# Patient Record
Sex: Female | Born: 1935 | Race: White | Hispanic: No | Smoking: Never smoker
Health system: Southern US, Community
[De-identification: ages and names within clinical notes are randomized; demographics above are authoritative.]

## PROBLEM LIST (undated history)

## (undated) DIAGNOSIS — Z8619 Personal history of other infectious and parasitic diseases: Secondary | ICD-10-CM

## (undated) DIAGNOSIS — I10 Essential (primary) hypertension: Secondary | ICD-10-CM

## (undated) DIAGNOSIS — K5790 Diverticulosis of intestine, part unspecified, without perforation or abscess without bleeding: Secondary | ICD-10-CM

## (undated) HISTORY — PX: ABDOMINAL HYSTERECTOMY: SHX81

## (undated) HISTORY — PX: CHOLECYSTECTOMY: SHX55

---

## 2017-11-11 ENCOUNTER — Emergency Department: Payer: Medicare Other

## 2017-11-11 ENCOUNTER — Emergency Department
Admission: EM | Admit: 2017-11-11 | Discharge: 2017-11-11 | Disposition: A | Discharge status: Home / Self Care | Payer: Medicare Other | Attending: Emergency Medicine | Admitting: Emergency Medicine

## 2017-11-11 ENCOUNTER — Encounter: Payer: Self-pay | Admitting: Emergency Medicine

## 2017-11-11 ENCOUNTER — Other Ambulatory Visit: Payer: Self-pay

## 2017-11-11 DIAGNOSIS — R1032 Left lower quadrant pain: Secondary | ICD-10-CM | POA: Insufficient documentation

## 2017-11-11 DIAGNOSIS — Z79899 Other long term (current) drug therapy: Secondary | ICD-10-CM | POA: Diagnosis not present

## 2017-11-11 DIAGNOSIS — R1031 Right lower quadrant pain: Secondary | ICD-10-CM | POA: Insufficient documentation

## 2017-11-11 DIAGNOSIS — G8929 Other chronic pain: Secondary | ICD-10-CM

## 2017-11-11 DIAGNOSIS — R197 Diarrhea, unspecified: Secondary | ICD-10-CM | POA: Insufficient documentation

## 2017-11-11 HISTORY — DX: Diverticulosis of intestine, part unspecified, without perforation or abscess without bleeding: K57.90

## 2017-11-11 HISTORY — DX: Personal history of other infectious and parasitic diseases: Z86.19

## 2017-11-11 LAB — COMPREHENSIVE METABOLIC PANEL
ALT: 24 U/L (ref 14–54)
AST: 36 U/L (ref 15–41)
Albumin: 4.1 g/dL (ref 3.5–5.0)
Alkaline Phosphatase: 106 U/L (ref 38–126)
Anion gap: 11 (ref 5–15)
BUN: 15 mg/dL (ref 6–20)
CO2: 22 mmol/L (ref 22–32)
Calcium: 9.2 mg/dL (ref 8.9–10.3)
Chloride: 95 mmol/L — ABNORMAL LOW (ref 101–111)
Creatinine, Ser: 1.11 mg/dL — ABNORMAL HIGH (ref 0.44–1.00)
GFR calc Af Amer: 48 mL/min — ABNORMAL LOW (ref >60)
GFR calc non Af Amer: 42 mL/min — ABNORMAL LOW (ref >60)
Glucose, Bld: 97 mg/dL (ref 65–99)
Potassium: 3.9 mmol/L (ref 3.5–5.1)
Sodium: 128 mmol/L — ABNORMAL LOW (ref 135–145)
Total Bilirubin: 0.9 mg/dL (ref 0.3–1.2)
Total Protein: 6.8 g/dL (ref 6.5–8.1)

## 2017-11-11 LAB — URINALYSIS, COMPLETE (UACMP) WITH MICROSCOPIC
Bacteria, UA: NONE SEEN
Bilirubin Urine: NEGATIVE
Glucose, UA: NEGATIVE mg/dL
Hgb urine dipstick: NEGATIVE
Ketones, ur: NEGATIVE mg/dL
Leukocytes, UA: NEGATIVE
Nitrite: NEGATIVE
Protein, ur: NEGATIVE mg/dL
Specific Gravity, Urine: 1.002 — ABNORMAL LOW (ref 1.005–1.030)
Squamous Epithelial / LPF: NONE SEEN (ref 0–5)
pH: 8 (ref 5.0–8.0)

## 2017-11-11 LAB — CBC
HCT: 42.5 % (ref 35.0–47.0)
Hemoglobin: 14.4 g/dL (ref 12.0–16.0)
MCH: 28.2 pg (ref 26.0–34.0)
MCHC: 33.9 g/dL (ref 32.0–36.0)
MCV: 83.3 fL (ref 80.0–100.0)
Platelets: 250 10*3/uL (ref 150–440)
RBC: 5.11 MIL/uL (ref 3.80–5.20)
RDW: 15.5 % — ABNORMAL HIGH (ref 11.5–14.5)
WBC: 11.6 10*3/uL — ABNORMAL HIGH (ref 3.6–11.0)

## 2017-11-11 LAB — LIPASE, BLOOD: Lipase: 28 U/L (ref 11–51)

## 2017-11-11 MED ORDER — FLUCONAZOLE 50 MG PO TABS
150.0000 mg | ORAL_TABLET | Freq: Once | ORAL | Status: AC
  Administered 2017-11-11: 150 mg via ORAL
  Filled 2017-11-11: qty 1

## 2017-11-11 MED ORDER — IOPAMIDOL (ISOVUE-300) INJECTION 61%
80.0000 mL | Freq: Once | INTRAVENOUS | Status: AC | PRN
  Administered 2017-11-11: 80 mL via INTRAVENOUS

## 2017-11-11 MED ORDER — ALUM & MAG HYDROXIDE-SIMETH 200-200-20 MG/5ML PO SUSP
30.0000 mL | Freq: Once | ORAL | Status: AC
  Administered 2017-11-11: 30 mL via ORAL
  Filled 2017-11-11: qty 30

## 2017-11-11 MED ORDER — SODIUM CHLORIDE 0.9 % IV BOLUS
500.0000 mL | INTRAVENOUS | Status: AC
  Administered 2017-11-11: 500 mL via INTRAVENOUS

## 2017-11-11 MED ORDER — LIDOCAINE VISCOUS HCL 2 % MT SOLN
7.5000 mL | Freq: Once | OROMUCOSAL | Status: AC
Start: 2017-11-11 — End: 2017-11-11
  Administered 2017-11-11: 7.5 mL via OROMUCOSAL
  Filled 2017-11-11: qty 15

## 2017-11-11 NOTE — ED Triage Notes (Addendum)
Patient ambulatory to triage with steady gait, without difficulty or distress noted; pt reports lower abd "burning" x [id number]; just arrived from [place] and has appt [facility] this [facility] but c/o increase in pain with diarrhea

## 2017-11-11 NOTE — ED Notes (Signed)
Pt ambulated to BR without difficulty, pt resting in bed at this time. Pt has ice pack to abd over her shirt which pt states is what she does for the "burning" sensation, pt states it helps.

## 2017-11-11 NOTE — ED Provider Notes (Signed)
[facility] Emergency Department Provider Note  ____________________________________________   First MD Initiated Contact with Patient 11/11/17 [phone]     (approximate)  I have reviewed the triage vital signs and the nursing notes.   HISTORY  Chief Complaint Abdominal Pain    HPI Faith Vasquez is a 84 y.o. female who presents for evaluation of 4 years of lower abdominal burning pain is recently worsening watery stools.  The history is complicated and she presents with her son and daughter-in-law, but in short, she is from [place] and has at least temporarily relocated to [facility] in order to have second or additional opinions regarding her ongoing abdominal issues.  She has been seeing gastroenterology in [facility] for quite some time and according to the family they are not able to tell the patient why she continues to have burning lower abdominal pain.  She has had CT scans, she has been on medications, she has had lab work, etc., but there is been no definitive diagnosis.  They believe that she had C. difficile infection may be a year or 2 ago but that seemed to have resolved.  She has been on antibiotics recently for urinary tract infection she reports some persistent burning in her abdomen but but also "down there" indicating her genital region.  The family recently drove her here from [place] and they stopped off in [place] for her to go to an emergency department for the abdominal pain and diarrhea.  They said that no specific diagnosis was identified.  They continued on from [place] to [place] and the patient reports that all of the driving has made her lower abdominal burning worse.  She is unable to get any sleep because of the discomfort.  Her family reports that medications do not seem to help including tramadol which they were prescribed presumably in [place].  However they do not like giving her anything narcotic or related because it makes her  lightheaded and confused.  They would like to get some answers about why she has been having this pain and discomfort for 4 years and they would like her to get some rest tonight so they brought her to the emergency department.  Nothing in particular makes the abdominal pain better and nothing particular makes the diarrhea better or worse.  She describes it as watery and loose and "explosive".  Of note, she has an appointment with a [facility] gastroenterologist tomorrow.  Past Medical History:  Diagnosis Date  . Diverticulosis   . History of Clostridium difficile infection    uncertain dates, possibly in 2017 or 2018    There are no active problems to display for this patient.   Past Surgical History:  Procedure Laterality Date  . ABDOMINAL HYSTERECTOMY    . CHOLECYSTECTOMY      Prior to Admission medications   Medication Sig Start Date End Date Taking? Authorizing Provider  acidophilus (RISAQUAD) CAPS capsule Take 1 capsule by mouth daily.   Yes [provider]  famotidine (PEPCID) 40 MG tablet Take 40 mg by mouth 2 (two) times daily.   Yes [provider]  multivitamin-lutein (OCUVITE-LUTEIN) CAPS capsule Take 1 capsule by mouth daily.   Yes [provider]    Allergies Ciprofloxacin and Iodine  History reviewed. No pertinent family history.  Social History Social History   Tobacco Use  . Smoking status: Never Smoker  . Smokeless tobacco: Never Used  Substance Use Topics  . Alcohol use: Never    Frequency: Never  .  Drug use: Never    Review of Systems Constitutional: No fever/chills Eyes: No visual changes. ENT: No sore throat. Cardiovascular: Denies chest pain. Respiratory: Denies shortness of breath. Gastrointestinal: Chronic abdominal pain and diarrhea as described above Genitourinary: Negative for dysuria. Musculoskeletal: Negative for neck pain.  Negative for back pain. Integumentary: Negative for rash. Neurological: Negative for  headaches, focal weakness or numbness.   ____________________________________________   PHYSICAL EXAM:  VITAL SIGNS: ED Triage Vitals  Enc Vitals Group     BP 11/11/17 0214 (!) 167/87     Pulse Rate 11/11/17 0214 73     Resp 11/11/17 0214 18     Temp 11/11/17 0214 (!) 97.4 F (36.3 C)     Temp Source 11/11/17 0214 Oral     SpO2 11/11/17 0214 97 %     Weight 11/11/17 0212 63 kg (139 lb)     Height 11/11/17 0212 1.651 m (5\' 5" )     Head Circumference --      Peak Flow --      Pain Score 11/11/17 0212 8     Pain Loc --      Pain Edu? --      Excl. in GC? --     Constitutional: Alert and oriented. Well appearing and in no acute distress.  Looks considerably younger than chronological age. Eyes: Conjunctivae are normal.  Head: Atraumatic. Nose: No congestion/rhinnorhea. Mouth/Throat: Mucous membranes are moist. Neck: No stridor.  No meningeal signs.   Cardiovascular: Normal rate, regular rhythm. Good peripheral circulation. Grossly normal heart sounds. Respiratory: Normal respiratory effort.  No retractions. Lungs CTAB. Gastrointestinal: Soft and nondistended.  Diffuse tenderness throughout the lower abdomen with no focal tenderness Musculoskeletal: No lower extremity tenderness nor edema. No gross deformities of extremities. Neurologic:  Normal speech and language. No gross focal neurologic deficits are appreciated.  Skin:  Skin is warm, dry and intact. No rash noted. Psychiatric: Mood and affect are normal. Speech and behavior are normal.  ____________________________________________   LABS (all labs ordered are listed, but only abnormal results are displayed)  Labs Reviewed  COMPREHENSIVE METABOLIC PANEL - Abnormal; Notable for the following components:      Result Value   Sodium 128 (*)    Chloride 95 (*)    Creatinine, Ser 1.11 (*)    GFR calc non Af Amer 42 (*)    GFR calc Af Amer 48 (*)    All other components within normal limits  CBC - Abnormal; Notable for  the following components:   WBC 11.6 (*)    RDW 15.5 (*)    All other components within normal limits  URINALYSIS, COMPLETE (UACMP) WITH MICROSCOPIC - Abnormal; Notable for the following components:   Color, Urine COLORLESS (*)    APPearance CLEAR (*)    Specific Gravity, Urine 1.002 (*)    All other components within normal limits  C DIFFICILE QUICK SCREEN W PCR REFLEX  GASTROINTESTINAL PANEL BY PCR, STOOL (REPLACES STOOL CULTURE)  LIPASE, BLOOD   ____________________________________________  EKG  No indication for EKG   ____________________________________________  RADIOLOGY   ED MD interpretation: No acute process demonstrated within the abdomen or pelvis with no sign of obstruction or inflammation.  The patient does have diverticulosis, as noted in her history, but without any evidence of diverticulitis.  She also has a pulmonary nodule.  Official radiology report(s): Ct Abdomen Pelvis W Contrast  Result Date: 11/11/2017 CLINICAL DATA:  Lower abdominal burning for 4 years. Pain and diarrhea. EXAM:  CT ABDOMEN AND PELVIS WITH CONTRAST TECHNIQUE: Multidetector CT imaging of the abdomen and pelvis was performed using the standard protocol following bolus administration of intravenous contrast. CONTRAST:  80mL ISOVUE-300 IOPAMIDOL (ISOVUE-300) INJECTION 61% COMPARISON:  None. FINDINGS: Lower chest: Mild dependent changes in the lung bases. 4 mm nodule in the left lung base. Small esophageal hiatal hernia. Hepatobiliary: No focal liver abnormality is seen. Status post cholecystectomy. No biliary dilatation. Pancreas: Unremarkable. No pancreatic ductal dilatation or surrounding inflammatory changes. Spleen: Vascular calcifications in the spleen. Subcentimeter focal low-attenuation lesion is nonspecific but likely to represent a hemangioma. Adrenals/Urinary Tract: Subcentimeter nodule in the right adrenal gland. Prominent bilateral parapelvic renal cysts with parenchymal cysts on the right  kidney. No hydronephrosis or hydroureter. Nephrograms are symmetrical. Bladder is unremarkable. Stomach/Bowel: Stomach, small bowel, and colon are not abnormally distended. No wall thickening or inflammatory infiltration identified. Prominent diverticulosis of the sigmoid colon with scattered diverticula throughout the colon. No inflammatory changes. Surgical absence of the appendix. Vascular/Lymphatic: Aortic atherosclerosis. No enlarged abdominal or pelvic lymph nodes. Reproductive: Status post hysterectomy. No adnexal masses. Other: Minimal periumbilical hernia containing fat. No free air or free fluid in the abdomen. Musculoskeletal: Degenerative changes in the spine. No destructive bone lesions. IMPRESSION: 1. No acute process demonstrated in the abdomen or pelvis. No evidence of bowel obstruction or inflammation. 2. Diverticulosis of the colon without evidence of diverticulitis. 3. Bilateral renal cysts. 4. Aortic atherosclerosis. 5. 4 mm nodule in the left lung base. No follow-up needed if patient is low-risk. Non-contrast chest CT can be considered in 12 months if patient is high-risk. This recommendation follows the consensus statement: Guidelines for Management of Incidental Pulmonary Nodules Detected on CT Images: From the Fleischner Society 2017; Radiology 2017; 284:228-243. Electronically Signed   By: Burman Nieves M.D.   On: 11/11/2017 05:41    ____________________________________________   PROCEDURES  Critical Care performed: No   Procedure(s) performed:   Procedures   ____________________________________________   INITIAL IMPRESSION / ASSESSMENT AND PLAN / ED COURSE  As part of my medical decision making, I reviewed the following data within the electronic MEDICAL RECORD NUMBEROld chart reviewed, history discussed with patient and family, labs reviewed, radiology report reviewed    Differential diagnosis includes, but is not limited to, chronic abdominal pain of nonspecific  etiology, C. difficile colitis, diverticulitis, nonspecific viral enteritis/colitis, obstruction/ileus, neoplasm, medication or dietary side effect.  In spite of the patient's history and frustration with her symptoms, she is very well-appearing and does not appear to be 84 years old.  Her vital signs are stable, afebrile, no tachycardia, elevated blood pressure but otherwise within normal limits.  Lab work is reassuring.  She has a normal lipase, and normal CBC except for a very mild leukocytosis of 11.6, negative urinalysis, and a conference of metabolic panel that is notable only for a very slightly decreased sodium of 128 that is most likely volume related.  I am giving her a normal saline bolus of 500 mL IV.  After an extensive discussion with the family, who are obviously frustrated with the persistent symptoms and lack of answers from their perspective, I agreed to check a CT scan with IV contrast only (so as not to worsen her diarrhea).  I was unable to find in her medical record any of the prior results from CT scans or anything extensive other than her gastroenterology notes indicating diverticulosis.  I also agreed that we would check a C. difficile and check GI pathogen panel if  she is able to provide a stool specimen.  We will observe the patient for 5 hours in the emergency department as she has been unable to provide a stool specimen.  Her CT scan was reassuring with no evidence of acute abnormality.  I gave her viscous lidocaine 2% 7.5 mL (approximately 150 mg) in Maalox 30 mL in an attempt to help provide some relief for the burning she is experiencing, but we discussed how narcotics are not can help and they do not want her to have narcotics anyway.  At her age other medications, including Bentyl, benzodiazepines, Benadryl, etc., can all have significant side effects.  I will encourage them to follow-up in 24 hours with her new GI doctor and in the meantime to stick with  Tylenol.  Clinical Course as of Nov 11 737  Wed Nov 11, 2017  0731 Apparently the patient did have a bowel movement and left the sample but it was either not collected in the emergency department or lost on the way to the lab.  I explained that we did not have a sample to run and the patient and her family are comfortable with following up tomorrow [facility] as [facility] rather than staying to give another sample.  I think this is appropriate given that her symptoms are chronic and there is no evidence of acute infection at this time. I gave my usual and customary return precautions.  I also provided a CD with her CT imaging to take with her for tomorrow's appointment.   [CF]    Clinical Course User Index [CF] Loleta RoseForbach, Legna Mausolf, MD    ____________________________________________  FINAL CLINICAL IMPRESSION(S) / ED DIAGNOSES  Final diagnoses:  Chronic bilateral lower abdominal pain  Diarrhea, unspecified type     MEDICATIONS GIVEN DURING THIS VISIT:  Medications  sodium chloride 0.9 % bolus 500 mL (0 mLs Intravenous Stopped 11/11/17 0641)  fluconazole (DIFLUCAN) tablet 150 mg (150 mg Oral Given 11/11/17 0515)  lidocaine (XYLOCAINE) 2 % viscous mouth solution 7.5 mL (7.5 mLs Mouth/Throat Given 11/11/17 0516)  alum & mag hydroxide-simeth (MAALOX/MYLANTA) 200-200-20 MG/5ML suspension 30 mL (30 mLs Oral Given 11/11/17 0516)  iopamidol (ISOVUE-300) 61 % injection 80 mL (80 mLs Intravenous Contrast Given 11/11/17 0521)     ED Discharge Orders    None       Note:  This document was prepared using Dragon voice recognition software and may include unintentional dictation errors.    Loleta RoseForbach, Janthony Holleman, MD 11/11/17 [phone]

## 2017-11-11 NOTE — Discharge Instructions (Signed)

## 2017-11-11 NOTE — ED Notes (Signed)
Pt returned from CT °

## 2017-11-11 NOTE — ED Notes (Signed)
ED Provider at bedside. 

## 2020-03-24 ENCOUNTER — Emergency Department: Payer: Medicare Other

## 2020-03-24 ENCOUNTER — Other Ambulatory Visit: Payer: Self-pay

## 2020-03-24 ENCOUNTER — Inpatient Hospital Stay
Admission: EM | Admit: 2020-03-24 | Discharge: 2020-03-27 | DRG: 521 | Disposition: A | Discharge status: Skilled Nursing Facility | Payer: Medicare Other | Attending: Hospitalist | Admitting: Hospitalist

## 2020-03-24 DIAGNOSIS — Z90710 Acquired absence of both cervix and uterus: Secondary | ICD-10-CM

## 2020-03-24 DIAGNOSIS — J9691 Respiratory failure, unspecified with hypoxia: Secondary | ICD-10-CM | POA: Diagnosis present

## 2020-03-24 DIAGNOSIS — S72002A Fracture of unspecified part of neck of left femur, initial encounter for closed fracture: Secondary | ICD-10-CM

## 2020-03-24 DIAGNOSIS — R339 Retention of urine, unspecified: Secondary | ICD-10-CM | POA: Diagnosis not present

## 2020-03-24 DIAGNOSIS — Z20822 Contact with and (suspected) exposure to covid-19: Secondary | ICD-10-CM | POA: Diagnosis present

## 2020-03-24 DIAGNOSIS — S72032A Displaced midcervical fracture of left femur, initial encounter for closed fracture: Principal | ICD-10-CM | POA: Diagnosis present

## 2020-03-24 DIAGNOSIS — W19XXXA Unspecified fall, initial encounter: Secondary | ICD-10-CM

## 2020-03-24 DIAGNOSIS — Y92019 Unspecified place in single-family (private) house as the place of occurrence of the external cause: Secondary | ICD-10-CM

## 2020-03-24 DIAGNOSIS — Z79899 Other long term (current) drug therapy: Secondary | ICD-10-CM | POA: Diagnosis not present

## 2020-03-24 DIAGNOSIS — I447 Left bundle-branch block, unspecified: Secondary | ICD-10-CM | POA: Diagnosis present

## 2020-03-24 DIAGNOSIS — Z888 Allergy status to other drugs, medicaments and biological substances status: Secondary | ICD-10-CM

## 2020-03-24 DIAGNOSIS — K219 Gastro-esophageal reflux disease without esophagitis: Secondary | ICD-10-CM | POA: Diagnosis present

## 2020-03-24 DIAGNOSIS — G8918 Other acute postprocedural pain: Secondary | ICD-10-CM

## 2020-03-24 DIAGNOSIS — S72002B Fracture of unspecified part of neck of left femur, initial encounter for open fracture type I or II: Secondary | ICD-10-CM

## 2020-03-24 DIAGNOSIS — W010XXA Fall on same level from slipping, tripping and stumbling without subsequent striking against object, initial encounter: Secondary | ICD-10-CM | POA: Diagnosis present

## 2020-03-24 DIAGNOSIS — Z881 Allergy status to other antibiotic agents status: Secondary | ICD-10-CM | POA: Diagnosis not present

## 2020-03-24 LAB — CBC
HCT: 44.5 % (ref 36.0–46.0)
Hemoglobin: 14.6 g/dL (ref 12.0–15.0)
MCH: 28.1 pg (ref 26.0–34.0)
MCHC: 32.8 g/dL (ref 30.0–36.0)
MCV: 85.6 fL (ref 80.0–100.0)
Platelets: 143 10*3/uL — ABNORMAL LOW (ref 150–400)
RBC: 5.2 MIL/uL — ABNORMAL HIGH (ref 3.87–5.11)
RDW: 14.2 % (ref 11.5–15.5)
WBC: 6.9 10*3/uL (ref 4.0–10.5)
nRBC: 0 % (ref 0.0–0.2)

## 2020-03-24 LAB — RESPIRATORY PANEL BY RT PCR (FLU A&B, COVID)
Influenza A by PCR: NEGATIVE
Influenza B by PCR: NEGATIVE
SARS Coronavirus 2 by RT PCR: NEGATIVE

## 2020-03-24 LAB — BASIC METABOLIC PANEL
Anion gap: 11 (ref 5–15)
BUN: 28 mg/dL — ABNORMAL HIGH (ref 8–23)
CO2: 24 mmol/L (ref 22–32)
Calcium: 9.8 mg/dL (ref 8.9–10.3)
Chloride: 105 mmol/L (ref 98–111)
Creatinine, Ser: 1.44 mg/dL — ABNORMAL HIGH (ref 0.44–1.00)
GFR, Estimated: 34 mL/min — ABNORMAL LOW (ref >60)
Glucose, Bld: 236 mg/dL — ABNORMAL HIGH (ref 70–99)
Potassium: 4.1 mmol/L (ref 3.5–5.1)
Sodium: 140 mmol/L (ref 135–145)

## 2020-03-24 MED ORDER — METHOCARBAMOL 500 MG PO TABS
500.0000 mg | ORAL_TABLET | Freq: Four times a day (QID) | ORAL | Status: DC | PRN
  Administered 2020-03-24 – 2020-03-26 (×2): 500 mg via ORAL
  Filled 2020-03-24 (×3): qty 1

## 2020-03-24 MED ORDER — MORPHINE SULFATE (PF) 2 MG/ML IV SOLN
0.5000 mg | Freq: Once | INTRAVENOUS | Status: AC
  Administered 2020-03-24: 0.5 mg via INTRAVENOUS
  Filled 2020-03-24: qty 1

## 2020-03-24 MED ORDER — ONDANSETRON HCL 4 MG/2ML IJ SOLN
4.0000 mg | Freq: Once | INTRAMUSCULAR | Status: AC
Start: 2020-03-24 — End: 2020-03-24
  Administered 2020-03-24: 4 mg via INTRAVENOUS
  Filled 2020-03-24: qty 2

## 2020-03-24 MED ORDER — CEFAZOLIN SODIUM-DEXTROSE 1-4 GM/50ML-% IV SOLN
1.0000 g | Freq: Once | INTRAVENOUS | Status: DC
  Filled 2020-03-24: qty 50

## 2020-03-24 MED ORDER — MORPHINE SULFATE (PF) 2 MG/ML IV SOLN
0.5000 mg | INTRAVENOUS | Status: DC | PRN
  Administered 2020-03-24 – 2020-03-25 (×2): 0.5 mg via INTRAVENOUS
  Filled 2020-03-24 (×2): qty 1

## 2020-03-24 MED ORDER — HYDROCODONE-ACETAMINOPHEN 5-325 MG PO TABS
1.0000 | ORAL_TABLET | Freq: Four times a day (QID) | ORAL | Status: DC | PRN
  Filled 2020-03-24: qty 2

## 2020-03-24 MED ORDER — FENTANYL CITRATE (PF) 100 MCG/2ML IJ SOLN
50.0000 ug | Freq: Once | INTRAMUSCULAR | Status: AC
  Administered 2020-03-24: 50 ug via INTRAVENOUS
  Filled 2020-03-24: qty 2

## 2020-03-24 MED ORDER — OCUVITE-LUTEIN PO CAPS
1.0000 | ORAL_CAPSULE | Freq: Every day | ORAL | Status: DC
  Administered 2020-03-26 – 2020-03-27 (×2): 1 via ORAL
  Filled 2020-03-24 (×4): qty 1

## 2020-03-24 MED ORDER — RISAQUAD PO CAPS
1.0000 | ORAL_CAPSULE | Freq: Every day | ORAL | Status: DC
  Administered 2020-03-26 – 2020-03-27 (×2): 1 via ORAL
  Filled 2020-03-24 (×3): qty 1

## 2020-03-24 MED ORDER — METHOCARBAMOL 1000 MG/10ML IJ SOLN
500.0000 mg | Freq: Four times a day (QID) | INTRAVENOUS | Status: DC | PRN
  Filled 2020-03-24: qty 5

## 2020-03-24 MED ORDER — BISACODYL 10 MG RE SUPP
10.0000 mg | Freq: Every day | RECTAL | Status: DC | PRN
  Filled 2020-03-24: qty 1

## 2020-03-24 MED ORDER — HYDROCODONE-ACETAMINOPHEN 5-325 MG PO TABS
1.0000 | ORAL_TABLET | Freq: Four times a day (QID) | ORAL | Status: DC | PRN
  Administered 2020-03-24 (×2): 1 via ORAL
  Filled 2020-03-24: qty 1

## 2020-03-24 MED ORDER — FAMOTIDINE 20 MG PO TABS
40.0000 mg | ORAL_TABLET | Freq: Two times a day (BID) | ORAL | Status: DC
  Administered 2020-03-25 – 2020-03-27 (×4): 40 mg via ORAL
  Filled 2020-03-24 (×6): qty 2

## 2020-03-24 NOTE — ED Notes (Signed)
Report given to Kathryn RN

## 2020-03-24 NOTE — ED Notes (Signed)
Pt son, Kristl Morioka is leaving to shower and will return shortly, if needed call him [phone]

## 2020-03-24 NOTE — ED Triage Notes (Signed)
Pt arrived via EMS for report of mechanical fall this am Pt had a UTI x3 weeks ago and was treated with 2 rounds of atb Pt c/o left hip pain Pt had desat to 90% on RA so was placed on 2L via n/c Pt is A&O x2

## 2020-03-24 NOTE — ED Provider Notes (Signed)
[facility] Emergency Department Provider Note  Time seen: 10:58 AM  I have reviewed the triage vital signs and the nursing notes.   HISTORY  Chief Complaint Fall and Hip Pain   HPI Faith Vasquez is a 86 y.o. female presents to the emergency department after a fall at home.  According to the patient she tripped falling onto her left side.  Patient states immediate pain to the left hip.  Has been experiencing some mild discomfort in left hip over the past 2 to 3 days but nothing like this morning's pain.  States significant pain with any attempted movement or ambulation.  Does not believe she hit her head.  Denies anticoagulation.  Denies any other extremity pain.  Has not been ambulatory since the fall.   Past Medical History:  Diagnosis Date  . Diverticulosis   . History of Clostridium difficile infection    uncertain dates, possibly in 2017 or 2018    There are no problems to display for this patient.   Past Surgical History:  Procedure Laterality Date  . ABDOMINAL HYSTERECTOMY    . CHOLECYSTECTOMY      Prior to Admission medications   Medication Sig Start Date End Date Taking? Authorizing Provider  acidophilus (RISAQUAD) CAPS capsule Take 1 capsule by mouth daily.    [provider]  famotidine (PEPCID) 40 MG tablet Take 40 mg by mouth 2 (two) times daily.    [provider]  multivitamin-lutein (OCUVITE-LUTEIN) CAPS capsule Take 1 capsule by mouth daily.    [provider]    Allergies  Allergen Reactions  . Ciprofloxacin   . Iodine     No family history on file.  Social History Social History   Tobacco Use  . Smoking status: Never Smoker  . Smokeless tobacco: Never Used  Substance Use Topics  . Alcohol use: Never  . Drug use: Never    Review of Systems Constitutional: Negative for loss of consciousness. Cardiovascular: Negative for chest pain. Respiratory: Negative for shortness of  breath. Gastrointestinal: Negative for abdominal pain Musculoskeletal: Left hip pain Skin: Negative for skin complaints  Neurological: Negative for headache All other ROS negative  ____________________________________________   PHYSICAL EXAM:  VITAL SIGNS: ED Triage Vitals  Enc Vitals Group     BP 03/24/20 1056 (!) 187/82     Pulse Rate 03/24/20 1056 85     Resp 03/24/20 1056 (!) 23     Temp 03/24/20 1056 98 F (36.7 C)     Temp Source 03/24/20 1056 Oral     SpO2 03/24/20 1055 90 %     Weight 03/24/20 1057 130 lb (59 kg)     Height 03/24/20 1057 5\' 5"  (1.651 m)     Head Circumference --      Peak Flow --      Pain Score 03/24/20 1057 8     Pain Loc --      Pain Edu? --      Excl. in GC? --     Constitutional: Alert. Well appearing and in no distress. Eyes: Normal exam ENT      Head: Normocephalic and atraumatic.      Mouth/Throat: Mucous membranes are moist. Cardiovascular: Normal rate, regular rhythm.  Respiratory: Normal respiratory effort without tachypnea nor retractions. Breath sounds are clear  Gastrointestinal: Soft and nontender. No distention. Musculoskeletal: Moderate left hip tenderness to palpation.  Neuro vastly intact distally.  Significant pain with any attempted range of motion.  Good range  of motion right lower extremity and bilateral upper extremities without pain. Neurologic:  Normal speech and language. No gross focal neurologic deficits Skin:  Skin is warm, dry and intact.  Psychiatric: Mood and affect are normal.   ____________________________________________    EKG  EKG viewed and interpreted by myself shows a sinus rhythm 86 bpm with a widened QRS, left axis deviation, left bundle branch block.  Nonspecific ST changes.  ____________________________________________    RADIOLOGY  Patient's hip x-ray shows a left femoral neck fracture.  ____________________________________________   INITIAL IMPRESSION / ASSESSMENT AND PLAN / ED  COURSE  Pertinent labs & imaging results that were available during my care of the patient were reviewed by me and considered in my medical decision making (see chart for details).   Patient presents to the emergency department after a fall with left hip pain.  Patient does have significant tenderness to palpation left hip and significant pain with range of motion of left hip.  Neurovascular intact distally.  We will check x-rays, labs and a CT scan head as precaution.  Exam is concerning for hip fracture.  Hip x-ray consistent with left femoral neck fracture.  Lab work largely nonrevealing.  Updated the patient and her son regarding need for admission and surgery.  I spoke to Dr. Rosita Kea of orthopedic surgery, we will plan on operating tomorrow.  We will make the patient n.p.o. after midnight tonight.  Faith Vasquez was evaluated in Emergency Department on 03/24/2020 for the symptoms described in the history of present illness. She was evaluated in the context of the global COVID-19 pandemic, which necessitated consideration that the patient might be at risk for infection with the SARS-CoV-2 virus that causes COVID-19. Institutional protocols and algorithms that pertain to the evaluation of patients at risk for COVID-19 are in a state of rapid change based on information released by regulatory bodies including the CDC and federal and state organizations. These policies and algorithms were followed during the patient's care in the ED.  ____________________________________________   FINAL CLINICAL IMPRESSION(S) / ED DIAGNOSES  Left hip fracture Thresa Ross, MD 03/24/20 1306

## 2020-03-24 NOTE — Consult Note (Signed)
Reason for Consult:left hip fracture Referring Physician: Dr. [name] is an 86 y.o. female.  HPI: Patient suffered a fall she says she lost her balance does not really know why she fell leave against the wall and slid down and suspect that she may have had the fracture first and then went to the ground.  She has had recently had some confusion that her son reports is not normal for her she normally is active in community ambulator without assistive device.  She just recently moved here from the [place] area and they are working on getting it at [facility].  She denies loss of consciousness or other problem, denies prior hip pain  Past Medical History:  Diagnosis Date  . Diverticulosis   . History of Clostridium difficile infection    uncertain dates, possibly in 2017 or 2018    Past Surgical History:  Procedure Laterality Date  . ABDOMINAL HYSTERECTOMY    . CHOLECYSTECTOMY      No family history on file.  Social History:  reports that she has never smoked. She has never used smokeless tobacco. She reports that she does not drink alcohol and does not use drugs.  Allergies:  Allergies  Allergen Reactions  . Ciprofloxacin   . Iodine     Medications: I have reviewed the patient's current medications.  Results for orders placed or performed during the hospital encounter of 03/24/20 (from the past 48 hour(s))  CBC     Status: Abnormal   Collection Time: 03/24/20 10:58 AM  Result Value Ref Range   WBC 6.9 4.0 - 10.5 K/uL   RBC 5.20 (H) 3.87 - 5.11 MIL/uL   Hemoglobin 14.6 12.0 - 15.0 g/dL   HCT 58.0 36 - 46 %   MCV 85.6 80.0 - 100.0 fL   MCH 28.1 26.0 - 34.0 pg   MCHC 32.8 30.0 - 36.0 g/dL   RDW 99.8 33.8 - 25.0 %   Platelets 143 (L) 150 - 400 K/uL   nRBC 0.0 0.0 - 0.2 %    Comment: Performed at [facility], [address] [place], [place] [address]  Basic metabolic panel     Status: Abnormal   Collection Time: 03/24/20 10:58 AM  Result  Value Ref Range   Sodium 140 135 - 145 mmol/L   Potassium 4.1 3.5 - 5.1 mmol/L   Chloride 105 98 - 111 mmol/L   CO2 24 22 - 32 mmol/L   Glucose, Bld 236 (H) 70 - 99 mg/dL    Comment: Glucose reference range applies only to samples taken after fasting for at least 8 hours.   BUN 28 (H) 8 - 23 mg/dL   Creatinine, Ser 7.34 (H) 0.44 - 1.00 mg/dL   Calcium 9.8 8.9 - 19.3 mg/dL   GFR, Estimated 34 (L) >60 mL/min    Comment: (NOTE) Calculated using the CKD-EPI Creatinine Equation (2021)    Anion gap 11 5 - 15    Comment: Performed at [facility], [address], [place], [place] [address]  Respiratory Panel by RT PCR (Flu A&B, Covid) - Nasopharyngeal Swab     Status: None   Collection Time: 03/24/20 12:35 PM   Specimen: Nasopharyngeal Swab  Result Value Ref Range   SARS Coronavirus 2 by RT PCR NEGATIVE NEGATIVE    Comment: (NOTE) SARS-CoV-2 target nucleic acids are NOT DETECTED.  The SARS-CoV-2 RNA is generally detectable in upper respiratoy specimens during the acute phase of infection. The lowest concentration  of SARS-CoV-2 viral copies this assay can detect is 131 copies/mL. A negative result does not preclude SARS-Cov-2 infection and should not be used as the sole basis for treatment or other patient management decisions. A negative result may occur with  improper specimen collection/handling, submission of specimen other than nasopharyngeal swab, presence of viral mutation(s) within the areas targeted by this assay, and inadequate number of viral copies (<131 copies/mL). A negative result must be combined with clinical observations, patient history, and epidemiological information. The expected result is Negative.  Fact Sheet for Patients:  https://www.moore.com/  Fact Sheet for Healthcare Providers:  https://www.young.biz/  This test is no t yet approved or cleared by the [place] FDA and  has been authorized for  detection and/or diagnosis of SARS-CoV-2 by FDA under an Emergency Use Authorization (EUA). This EUA will remain  in effect (meaning this test can be used) for the duration of the COVID-19 declaration under Section 564(b)(1) of the Act, 21 U.S.C. section 360bbb-3(b)(1), unless the authorization is terminated or revoked sooner.     Influenza A by PCR NEGATIVE NEGATIVE   Influenza B by PCR NEGATIVE NEGATIVE    Comment: (NOTE) The Xpert Xpress SARS-CoV-2/FLU/RSV assay is intended as an aid in  the diagnosis of influenza from Nasopharyngeal swab specimens and  should not be used as a sole basis for treatment. Nasal washings and  aspirates are unacceptable for Xpert Xpress SARS-CoV-2/FLU/RSV  testing.  Fact Sheet for Patients: https://www.moore.com/  Fact Sheet for Healthcare Providers: https://www.young.biz/  This test is not yet approved or cleared by the [place] FDA and  has been authorized for detection and/or diagnosis of SARS-CoV-2 by  FDA under an Emergency Use Authorization (EUA). This EUA will remain  in effect (meaning this test can be used) for the duration of the  Covid-19 declaration under Section 564(b)(1) of the Act, 21  U.S.C. section 360bbb-3(b)(1), unless the authorization is  terminated or revoked. Performed at [facility], [address] [place], [place] [address]     DG Chest 1 View  Result Date: 03/24/2020 CLINICAL DATA:  Mechanical fall this morning with left hip pain. EXAM: CHEST  1 VIEW COMPARISON:  None. FINDINGS: The heart size is normal. Vascular calcifications are seen in the aortic arch. Both lungs are clear. The visualized skeletal structures are unremarkable. IMPRESSION: No active disease. Electronically Signed   By: Romona Curls M.D.   On: 03/24/2020 12:48   CT Head Wo Contrast  Result Date: 03/24/2020 CLINICAL DATA:  Mechanical fall with head trauma and pain. EXAM: CT HEAD WITHOUT CONTRAST  TECHNIQUE: Contiguous axial images were obtained from the base of the skull through the vertex without intravenous contrast. COMPARISON:  None. FINDINGS: Brain: No evidence of acute infarction, hemorrhage, hydrocephalus, extra-axial collection or mass lesion/mass effect. A chronic lacunar infarct is seen in the genu of the left internal capsule. There is mild cerebral volume loss with associated ex vacuo dilatation. Periventricular white matter hypoattenuation likely represents chronic small vessel ischemic disease. Vascular: There are vascular calcifications in the carotid siphons. Skull: Normal. Negative for fracture or focal lesion. Sinuses/Orbits: No acute finding. Other: None. IMPRESSION: 1. No acute intracranial process. Electronically Signed   By: Romona Curls M.D.   On: 03/24/2020 12:14   DG HIP UNILAT WITH PELVIS 2-3 VIEWS LEFT  Result Date: 03/24/2020 CLINICAL DATA:  Left hip pain EXAM: DG HIP (WITH OR WITHOUT PELVIS) 2-3V LEFT COMPARISON:  None. FINDINGS: There is an impacted transcervical left femoral neck  fracture. The humeral head articulates with the acetabulum and there is no hip dislocation. Mild degenerative changes are seen in both hips. Stool overlies the rectum. Degenerative changes are seen in the spine. IMPRESSION: Impacted transcervical left femoral neck fracture. Electronically Signed   By: Romona Curls M.D.   On: 03/24/2020 12:38    Review of Systems Blood pressure (!) 187/82, pulse 85, temperature 98 F (36.7 C), temperature source Oral, resp. rate (!) 23, height 5\' 5"  (1.651 m), weight 59 kg, SpO2 98 %. Physical Exam The left leg is shortened and externally rotated with skin intact and palpable pulses Radiographs reveal minimal degenerative change with a completely displaced femoral neck fracture on the lateral view Assessment/Plan: Displaced femoral neck fracture left, plan is for left hip hemiarthroplasty in the morning plan a cemented prosthesis to allow for immediate  weightbearing as tolerated.  03/24/2020, 2:04 PM

## 2020-03-24 NOTE — H&P (Addendum)
History and Physical    Faith Vasquez KXF:[id number] DOB: 04-16-1923 DOA: 03/24/2020  PCP: Patient, No Pcp Per   Patient coming from: Home  I have personally briefly reviewed patient's old medical records in [facility]  Chief Complaint: S/p Fall  HPI: Faith Vasquez is a 86 y.o. femalewith no significant past medical history who was brought into the emergency room after a fall at home.  Patient's son states that she recently moved to [place] from [place], [place] with plans to move to an assisted living facility. Per her so she had all her luggage in her room and appears to have tripped over a bag. She was found on the ground and was unable to bear weight on her left leg. She complains of pain in her left hip which he rates 8/10 in intensity at its worst. She denies feeling dizzy or light headed. She denies having nausea, vomiting, diarrhea, chest pain, SOB, urinary symptoms. Labs show sodium 140, potassium 4.1, Chloride 105, Bicarbonate 24, BUN 28, Creatinine 1.4, Calcium 9.8, WBC 6.9, Hb 14.6, MCV 85.6, RDW 14.2, Platelets 143 Respiratory viral panel was negative CT scan of the head was no acute intracranial process. Chest x ray showed no active disease Hip x ray showed impacted transcervical left femoral neck fracture. 12 Lead EKG showed sinus rhythm with prolonged PR interval and LBBB    ED Course: Patient is a 86 year old female who presents to the ER after a fall at home and is found to have an impacted transcervical left femoral neck fracture. She will be admitted to the hospital for further evaluation.    Review of Systems: As per HPI otherwise 10 point review of systems negative.    Past Medical History:  Diagnosis Date  . Diverticulosis   . History of Clostridium difficile infection    uncertain dates, possibly in 2017 or 2018    Past Surgical History:  Procedure Laterality Date  . ABDOMINAL HYSTERECTOMY    . CHOLECYSTECTOMY       reports that  she has never smoked. She has never used smokeless tobacco. She reports that she does not drink alcohol and does not use drugs.  Allergies  Allergen Reactions  . Ciprofloxacin   . Iodine     No family history on file.   Prior to Admission medications   Medication Sig Start Date End Date Taking? Authorizing Provider  acidophilus (RISAQUAD) CAPS capsule Take 1 capsule by mouth daily.    [provider]  atorvastatin (LIPITOR) 20 MG tablet Take 20 mg by mouth at bedtime. 03/15/20   [provider]  Cholecalciferol (VITAMIN D3) 1.25 MG (50000 UT) CAPS Take 1 capsule by mouth once a week. Wednesday 03/22/20   [provider]  famotidine (PEPCID) 40 MG tablet Take 40 mg by mouth 2 (two) times daily.    [provider]  multivitamin-lutein (OCUVITE-LUTEIN) CAPS capsule Take 1 capsule by mouth daily.    [provider]    Physical Exam: Vitals:   03/24/20 1055 03/24/20 1056 03/24/20 1057  BP:  (!) 187/82   Pulse:  85   Resp:  (!) 23   Temp:  98 F (36.7 C)   TempSrc:  Oral   SpO2: 90% 98%   Weight:   59 kg  Height:   5\' 5"  (1.651 m)     Vitals:   03/24/20 1055 03/24/20 1056 03/24/20 1057  BP:  (!) 187/82   Pulse:  85   Resp:  (!)  23   Temp:  98 F (36.7 C)   TempSrc:  Oral   SpO2: 90% 98%   Weight:   59 kg  Height:   5\' 5"  (1.651 m)    Constitutional: NAD, alert and oriented x 3 Eyes: PERRL, lids and conjunctivae pallor ENMT: Mucous membranes are moist.  Neck: normal, supple, no masses, no thyromegaly Respiratory: clear to auscultation bilaterally, no wheezing, no crackles. Normal respiratory effort. No accessory muscle use.  Cardiovascular: Regular rate and rhythm, no murmurs / rubs / gallops. No extremity edema. 2+ pedal pulses. No carotid bruits.  Abdomen: no tenderness, no masses palpated. No hepatosplenomegaly. Bowel sounds positive.  Musculoskeletal: no clubbing / cyanosis. Decreased ROM left hip Skin: no rashes,  lesions, ulcers.  Neurologic: No gross focal neurologic deficit. Psychiatric: Normal mood and affect.   Labs on Admission: I have personally reviewed following labs and imaging studies  CBC: Recent Labs  Lab 03/24/20 1058  WBC 6.9  HGB 14.6  HCT 44.5  MCV 85.6  PLT 143*   Basic Metabolic Panel: Recent Labs  Lab 03/24/20 1058  NA 140  K 4.1  CL 105  CO2 24  GLUCOSE 236*  BUN 28*  CREATININE 1.44*  CALCIUM 9.8   GFR: Estimated Creatinine Clearance: 21.5 mL/min (A) (by C-G formula based on SCr of 1.44 mg/dL (H)). Liver Function Tests: No results for input(s): AST, ALT, ALKPHOS, BILITOT, PROT, ALBUMIN in the last 168 hours. No results for input(s): LIPASE, AMYLASE in the last 168 hours. No results for input(s): AMMONIA in the last 168 hours. Coagulation Profile: No results for input(s): INR, PROTIME in the last 168 hours. Cardiac Enzymes: No results for input(s): CKTOTAL, CKMB, CKMBINDEX, TROPONINI in the last 168 hours. BNP (last 3 results) No results for input(s): PROBNP in the last 8760 hours. HbA1C: No results for input(s): HGBA1C in the last 72 hours. CBG: No results for input(s): GLUCAP in the last 168 hours. Lipid Profile: No results for input(s): CHOL, HDL, LDLCALC, TRIG, CHOLHDL, LDLDIRECT in the last 72 hours. Thyroid Function Tests: No results for input(s): TSH, T4TOTAL, FREET4, T3FREE, THYROIDAB in the last 72 hours. Anemia Panel: No results for input(s): VITAMINB12, FOLATE, FERRITIN, TIBC, IRON, RETICCTPCT in the last 72 hours. Urine analysis:    Component Value Date/Time   COLORURINE COLORLESS (A) 11/11/2017 0215   APPEARANCEUR CLEAR (A) 11/11/2017 0215   LABSPEC 1.002 (L) 11/11/2017 0215   PHURINE 8.0 11/11/2017 0215   GLUCOSEU NEGATIVE 11/11/2017 0215   HGBUR NEGATIVE 11/11/2017 0215   BILIRUBINUR NEGATIVE 11/11/2017 0215   KETONESUR NEGATIVE 11/11/2017 0215   PROTEINUR NEGATIVE 11/11/2017 0215   NITRITE NEGATIVE 11/11/2017 0215    LEUKOCYTESUR NEGATIVE 11/11/2017 0215    Radiological Exams on Admission: DG Chest 1 View  Result Date: 03/24/2020 CLINICAL DATA:  Mechanical fall this morning with left hip pain. EXAM: CHEST  1 VIEW COMPARISON:  None. FINDINGS: The heart size is normal. Vascular calcifications are seen in the aortic arch. Both lungs are clear. The visualized skeletal structures are unremarkable. IMPRESSION: No active disease. Electronically Signed   By: 03/26/2020 M.D.   On: 03/24/2020 12:48   CT Head Wo Contrast  Result Date: 03/24/2020 CLINICAL DATA:  Mechanical fall with head trauma and pain. EXAM: CT HEAD WITHOUT CONTRAST TECHNIQUE: Contiguous axial images were obtained from the base of the skull through the vertex without intravenous contrast. COMPARISON:  None. FINDINGS: Brain: No evidence of acute infarction, hemorrhage, hydrocephalus, extra-axial collection or mass lesion/mass effect.  A chronic lacunar infarct is seen in the genu of the left internal capsule. There is mild cerebral volume loss with associated ex vacuo dilatation. Periventricular white matter hypoattenuation likely represents chronic small vessel ischemic disease. Vascular: There are vascular calcifications in the carotid siphons. Skull: Normal. Negative for fracture or focal lesion. Sinuses/Orbits: No acute finding. Other: None. IMPRESSION: 1. No acute intracranial process. Electronically Signed   By: Romona Curls M.D.   On: 03/24/2020 12:14   DG HIP UNILAT WITH PELVIS 2-3 VIEWS LEFT  Result Date: 03/24/2020 CLINICAL DATA:  Left hip pain EXAM: DG HIP (WITH OR WITHOUT PELVIS) 2-3V LEFT COMPARISON:  None. FINDINGS: There is an impacted transcervical left femoral neck fracture. The humeral head articulates with the acetabulum and there is no hip dislocation. Mild degenerative changes are seen in both hips. Stool overlies the rectum. Degenerative changes are seen in the spine. IMPRESSION: Impacted transcervical left femoral neck fracture.  Electronically Signed   By: Romona Curls M.D.   On: 03/24/2020 12:38    EKG: Independently reviewed.  Sinus rhythm with prolonged PR interval and LBBB  Assessment/Plan Principal Problem:   Left displaced femoral neck fracture (HCC) Active Problems:   GERD (gastroesophageal reflux disease)    Left displaced femoral neck fracture Patient is status post mechanical fall and has a left displaced femoral neck fracture Immobilize left lower extremity Pain control and muscle relaxants Consult orthopedic surgery   GERD Continue Pepcid   DVT prophylaxis: SCD Code Status: Full Code  Family Communication: Greater than 50% of time was spent discussing plan of care with patient and her son at the bedside. All questions and concerns were addressed. They verbalize understanding and agree with the plan. Code status was discussed and she is a Full code Disposition Plan: Back to previous home environment Consults called: Orthopedics    Zamantha Strebel MD [facility]     03/24/2020, 2:21 PM

## 2020-03-24 NOTE — ED Notes (Signed)
Attempted to give report x1. Nurse unable to take report at this time.

## 2020-03-25 ENCOUNTER — Inpatient Hospital Stay: Payer: Medicare Other

## 2020-03-25 ENCOUNTER — Inpatient Hospital Stay: Payer: Medicare Other | Admitting: Anesthesiology

## 2020-03-25 ENCOUNTER — Encounter: Payer: Self-pay | Admitting: Orthopedic Surgery

## 2020-03-25 ENCOUNTER — Encounter
Admission: EM | Disposition: A | Discharge status: Skilled Nursing Facility | Payer: Self-pay | Source: Home / Self Care | Attending: Hospitalist

## 2020-03-25 HISTORY — PX: HIP ARTHROPLASTY: SHX981

## 2020-03-25 LAB — BASIC METABOLIC PANEL
Anion gap: 9 (ref 5–15)
BUN: 30 mg/dL — ABNORMAL HIGH (ref 8–23)
CO2: 25 mmol/L (ref 22–32)
Calcium: 9.5 mg/dL (ref 8.9–10.3)
Chloride: 105 mmol/L (ref 98–111)
Creatinine, Ser: 1.28 mg/dL — ABNORMAL HIGH (ref 0.44–1.00)
GFR, Estimated: 39 mL/min — ABNORMAL LOW (ref >60)
Glucose, Bld: 115 mg/dL — ABNORMAL HIGH (ref 70–99)
Potassium: 3.7 mmol/L (ref 3.5–5.1)
Sodium: 139 mmol/L (ref 135–145)

## 2020-03-25 LAB — CBC
HCT: 39.8 % (ref 36.0–46.0)
Hemoglobin: 13.3 g/dL (ref 12.0–15.0)
MCH: 28.5 pg (ref 26.0–34.0)
MCHC: 33.4 g/dL (ref 30.0–36.0)
MCV: 85.4 fL (ref 80.0–100.0)
Platelets: 134 10*3/uL — ABNORMAL LOW (ref 150–400)
RBC: 4.66 MIL/uL (ref 3.87–5.11)
RDW: 14.4 % (ref 11.5–15.5)
WBC: 7.6 10*3/uL (ref 4.0–10.5)
nRBC: 0 % (ref 0.0–0.2)

## 2020-03-25 SURGERY — HEMIARTHROPLASTY, HIP, DIRECT ANTERIOR APPROACH, FOR FRACTURE
Anesthesia: Spinal | Site: Hip | Laterality: Left

## 2020-03-25 MED ORDER — SODIUM CHLORIDE FLUSH 0.9 % IV SOLN
INTRAVENOUS | Status: AC
  Filled 2020-03-25: qty 10

## 2020-03-25 MED ORDER — ASPIRIN 81 MG PO CHEW
81.0000 mg | CHEWABLE_TABLET | Freq: Every day | ORAL | Status: DC
  Administered 2020-03-25 – 2020-03-27 (×3): 81 mg via ORAL
  Filled 2020-03-25 (×3): qty 1

## 2020-03-25 MED ORDER — ENOXAPARIN SODIUM 30 MG/0.3ML ~~LOC~~ SOLN
30.0000 mg | SUBCUTANEOUS | Status: DC
  Administered 2020-03-26 – 2020-03-27 (×2): 30 mg via SUBCUTANEOUS
  Filled 2020-03-25 (×2): qty 0.3

## 2020-03-25 MED ORDER — SODIUM CHLORIDE 0.9 % IV SOLN: INTRAVENOUS | Status: DC

## 2020-03-25 MED ORDER — BUPIVACAINE-EPINEPHRINE 0.25% -1:200000 IJ SOLN
INTRAMUSCULAR | Status: DC | PRN
  Administered 2020-03-25: 30 mL

## 2020-03-25 MED ORDER — BUPIVACAINE HCL (PF) 0.5 % IJ SOLN
INTRAMUSCULAR | Status: DC | PRN
  Administered 2020-03-25: 2.5 mL

## 2020-03-25 MED ORDER — ONDANSETRON HCL 4 MG/2ML IJ SOLN: 4.0000 mg | Freq: Four times a day (QID) | INTRAMUSCULAR | Status: DC | PRN

## 2020-03-25 MED ORDER — PHENOL 1.4 % MT LIQD
1.0000 | OROMUCOSAL | Status: DC | PRN
  Filled 2020-03-25: qty 177

## 2020-03-25 MED ORDER — HYDROCODONE-ACETAMINOPHEN 5-325 MG PO TABS
1.0000 | ORAL_TABLET | ORAL | Status: DC | PRN
  Administered 2020-03-25 – 2020-03-26 (×2): 2 via ORAL
  Filled 2020-03-25 (×2): qty 2

## 2020-03-25 MED ORDER — ENSURE ENLIVE PO LIQD
237.0000 mL | Freq: Two times a day (BID) | ORAL | Status: DC
  Administered 2020-03-26 – 2020-03-27 (×4): 237 mL via ORAL
  Filled 2020-03-25: qty 237

## 2020-03-25 MED ORDER — PROPOFOL 500 MG/50ML IV EMUL
INTRAVENOUS | Status: DC | PRN
  Administered 2020-03-25: 100 ug/kg/min via INTRAVENOUS

## 2020-03-25 MED ORDER — ACETAMINOPHEN 10 MG/ML IV SOLN
INTRAVENOUS | Status: AC
  Filled 2020-03-25: qty 100

## 2020-03-25 MED ORDER — PHENYLEPHRINE HCL (PRESSORS) 10 MG/ML IV SOLN
INTRAVENOUS | Status: DC | PRN
  Administered 2020-03-25: 50 ug via INTRAVENOUS
  Administered 2020-03-25: 100 ug via INTRAVENOUS
  Administered 2020-03-25 (×2): 50 ug via INTRAVENOUS

## 2020-03-25 MED ORDER — MENTHOL 3 MG MT LOZG
1.0000 | LOZENGE | OROMUCOSAL | Status: DC | PRN
  Filled 2020-03-25: qty 9

## 2020-03-25 MED ORDER — BUPIVACAINE-EPINEPHRINE (PF) 0.25% -1:200000 IJ SOLN
INTRAMUSCULAR | Status: AC
  Filled 2020-03-25: qty 30

## 2020-03-25 MED ORDER — TRAMADOL HCL 50 MG PO TABS
50.0000 mg | ORAL_TABLET | Freq: Four times a day (QID) | ORAL | Status: DC
  Administered 2020-03-25 – 2020-03-26 (×5): 50 mg via ORAL
  Filled 2020-03-25 (×8): qty 1

## 2020-03-25 MED ORDER — MORPHINE SULFATE (PF) 2 MG/ML IV SOLN: 0.5000 mg | INTRAVENOUS | Status: DC | PRN

## 2020-03-25 MED ORDER — PROPOFOL 500 MG/50ML IV EMUL
INTRAVENOUS | Status: AC
  Filled 2020-03-25: qty 50

## 2020-03-25 MED ORDER — SODIUM CHLORIDE FLUSH 0.9 % IV SOLN
INTRAVENOUS | Status: AC
  Filled 2020-03-25: qty 40

## 2020-03-25 MED ORDER — ONDANSETRON HCL 4 MG/2ML IJ SOLN: 4.0000 mg | Freq: Once | INTRAMUSCULAR | Status: DC | PRN

## 2020-03-25 MED ORDER — ENOXAPARIN SODIUM 40 MG/0.4ML ~~LOC~~ SOLN: 40.0000 mg | SUBCUTANEOUS | Status: DC

## 2020-03-25 MED ORDER — ACETAMINOPHEN 10 MG/ML IV SOLN
INTRAVENOUS | Status: DC | PRN
  Administered 2020-03-25: 1000 mg via INTRAVENOUS

## 2020-03-25 MED ORDER — ATORVASTATIN CALCIUM 20 MG PO TABS
20.0000 mg | ORAL_TABLET | Freq: Every day | ORAL | Status: DC
  Administered 2020-03-25 – 2020-03-26 (×2): 20 mg via ORAL
  Filled 2020-03-25 (×2): qty 1

## 2020-03-25 MED ORDER — BUPIVACAINE LIPOSOME 1.3 % IJ SUSP
INTRAMUSCULAR | Status: AC
  Filled 2020-03-25: qty 20

## 2020-03-25 MED ORDER — DEXMEDETOMIDINE HCL 200 MCG/2ML IV SOLN
INTRAVENOUS | Status: DC | PRN
  Administered 2020-03-25: 8 ug via INTRAVENOUS

## 2020-03-25 MED ORDER — ACETAMINOPHEN 325 MG PO TABS: 325.0000 mg | ORAL_TABLET | Freq: Four times a day (QID) | ORAL | Status: DC | PRN

## 2020-03-25 MED ORDER — CEFAZOLIN SODIUM-DEXTROSE 1-4 GM/50ML-% IV SOLN
1.0000 g | Freq: Four times a day (QID) | INTRAVENOUS | Status: AC
  Administered 2020-03-25: 1 g via INTRAVENOUS
  Filled 2020-03-25 (×2): qty 50

## 2020-03-25 MED ORDER — NEOMYCIN-POLYMYXIN B GU 40-200000 IR SOLN
Status: AC
  Filled 2020-03-25: qty 2

## 2020-03-25 MED ORDER — ALUM & MAG HYDROXIDE-SIMETH 200-200-20 MG/5ML PO SUSP: 30.0000 mL | ORAL | Status: DC | PRN

## 2020-03-25 MED ORDER — MAGNESIUM HYDROXIDE 400 MG/5ML PO SUSP
30.0000 mL | Freq: Every day | ORAL | Status: DC | PRN
  Administered 2020-03-25 – 2020-03-26 (×2): 30 mL via ORAL
  Filled 2020-03-25 (×2): qty 30

## 2020-03-25 MED ORDER — CEFAZOLIN SODIUM-DEXTROSE 1-4 GM/50ML-% IV SOLN
INTRAVENOUS | Status: AC
  Filled 2020-03-25: qty 50

## 2020-03-25 MED ORDER — LACTATED RINGERS IV SOLN: INTRAVENOUS | Status: DC | PRN

## 2020-03-25 MED ORDER — DIPHENHYDRAMINE HCL 12.5 MG/5ML PO ELIX: 12.5000 mg | ORAL_SOLUTION | ORAL | Status: DC | PRN

## 2020-03-25 MED ORDER — CEFAZOLIN SODIUM-DEXTROSE 1-4 GM/50ML-% IV SOLN
INTRAVENOUS | Status: DC | PRN
  Administered 2020-03-25: 1 g via INTRAVENOUS

## 2020-03-25 MED ORDER — CHLORHEXIDINE GLUCONATE CLOTH 2 % EX PADS
6.0000 | MEDICATED_PAD | Freq: Every day | CUTANEOUS | Status: DC
  Administered 2020-03-26 – 2020-03-27 (×2): 6 via TOPICAL

## 2020-03-25 MED ORDER — NEOMYCIN-POLYMYXIN B GU 40-200000 IR SOLN
Status: DC | PRN
  Administered 2020-03-25: 2 mL

## 2020-03-25 MED ORDER — FENTANYL CITRATE (PF) 100 MCG/2ML IJ SOLN: 25.0000 ug | INTRAMUSCULAR | Status: DC | PRN

## 2020-03-25 MED ORDER — ONDANSETRON HCL 4 MG PO TABS: 4.0000 mg | ORAL_TABLET | Freq: Four times a day (QID) | ORAL | Status: DC | PRN

## 2020-03-25 MED ORDER — MAGNESIUM CITRATE PO SOLN
1.0000 | Freq: Once | ORAL | Status: DC | PRN
  Filled 2020-03-25: qty 296

## 2020-03-25 MED ORDER — DOCUSATE SODIUM 100 MG PO CAPS
100.0000 mg | ORAL_CAPSULE | Freq: Two times a day (BID) | ORAL | Status: DC
  Administered 2020-03-25 – 2020-03-27 (×5): 100 mg via ORAL
  Filled 2020-03-25 (×5): qty 1

## 2020-03-25 MED ORDER — PROPOFOL 10 MG/ML IV BOLUS
INTRAVENOUS | Status: DC | PRN
  Administered 2020-03-25: 20 mg via INTRAVENOUS
  Administered 2020-03-25: 50 mg via INTRAVENOUS

## 2020-03-25 MED ORDER — METOCLOPRAMIDE HCL 5 MG/ML IJ SOLN: 5.0000 mg | Freq: Three times a day (TID) | INTRAMUSCULAR | Status: DC | PRN

## 2020-03-25 MED ORDER — METOCLOPRAMIDE HCL 10 MG PO TABS: 5.0000 mg | ORAL_TABLET | Freq: Three times a day (TID) | ORAL | Status: DC | PRN

## 2020-03-25 MED ORDER — BISACODYL 10 MG RE SUPP
10.0000 mg | Freq: Every day | RECTAL | Status: DC | PRN
  Administered 2020-03-27: 10 mg via RECTAL
  Filled 2020-03-25: qty 1

## 2020-03-25 SURGICAL SUPPLY — 69 items
BAG DECANTER FOR FLEXI CONT (MISCELLANEOUS) IMPLANT
BLADE SAGITTAL AGGR TOOTH XLG (BLADE) ×2 IMPLANT
BNDG COHESIVE 6X5 TAN STRL LF (GAUZE/BANDAGES/DRESSINGS) ×6 IMPLANT
BOWL CEMENT MIXING ADV NOZZLE (MISCELLANEOUS) ×2 IMPLANT
CANISTER SUCT 1200ML W/VALVE (MISCELLANEOUS) IMPLANT
CANISTER WOUND CARE 500ML ATS (WOUND CARE) ×2 IMPLANT
CEMENT BONE 40GM (Cement) ×4 IMPLANT
CEMENT RESTRICTOR DEPUY SZ 1 (Cement) ×2 IMPLANT
CHLORAPREP W/TINT 26 (MISCELLANEOUS) ×2 IMPLANT
COVER BACK TABLE REUSABLE LG (DRAPES) ×2 IMPLANT
COVER WAND RF STERILE (DRAPES) ×2 IMPLANT
DRAPE 3/4 80X56 (DRAPES) ×6 IMPLANT
DRAPE C-ARM XRAY 36X54 (DRAPES) ×2 IMPLANT
DRAPE INCISE IOBAN 66X60 STRL (DRAPES) ×2 IMPLANT
DRAPE POUCH INSTRU U-SHP 10X18 (DRAPES) ×2 IMPLANT
DRESSING PRVNA BELLAFORM 24X22 (GAUZE/BANDAGES/DRESSINGS) ×1 IMPLANT
DRESSING SURGICEL FIBRLLR 1X2 (HEMOSTASIS) ×2 IMPLANT
DRSG MEPILEX SACRM 8.7X9.8 (GAUZE/BANDAGES/DRESSINGS) ×2 IMPLANT
DRSG OPSITE POSTOP 4X8 (GAUZE/BANDAGES/DRESSINGS) ×4 IMPLANT
DRSG PREVENA BELLAFORM 24X22 (GAUZE/BANDAGES/DRESSINGS) ×2
DRSG SURGICEL FIBRILLAR 1X2 (HEMOSTASIS) ×4
ELECT BLADE 6.5 EXT (BLADE) ×2 IMPLANT
ELECT REM PT RETURN 9FT ADLT (ELECTROSURGICAL) ×2
ELECTRODE REM PT RTRN 9FT ADLT (ELECTROSURGICAL) ×1 IMPLANT
GLOVE BIOGEL PI IND STRL 9 (GLOVE) ×1 IMPLANT
GLOVE BIOGEL PI INDICATOR 9 (GLOVE) ×1
GLOVE SURG SYN 9.0  PF PI (GLOVE) ×2
GLOVE SURG SYN 9.0 PF PI (GLOVE) ×2 IMPLANT
GOWN SRG 2XL LVL 4 RGLN SLV (GOWNS) ×1 IMPLANT
GOWN STRL NON-REIN 2XL LVL4 (GOWNS) ×1
GOWN STRL REUS W/ TWL LRG LVL3 (GOWN DISPOSABLE) ×1 IMPLANT
GOWN STRL REUS W/TWL LRG LVL3 (GOWN DISPOSABLE) ×1
HEAD BIPOLARE SZ46 HEAD 28 (Head) ×2 IMPLANT
HEMOVAC 400CC 10FR (MISCELLANEOUS) IMPLANT
HIP FEM HD S 28 (Head) ×2 IMPLANT
HOLDER FOLEY CATH W/STRAP (MISCELLANEOUS) IMPLANT
HOOD PEEL AWAY FLYTE STAYCOOL (MISCELLANEOUS) ×2 IMPLANT
IRRIGATION SURGIPHOR STRL (IV SOLUTION) IMPLANT
KIT PREVENA INCISION MGT 13 (CANNISTER) ×4 IMPLANT
MANIFOLD NEPTUNE II (INSTRUMENTS) ×2 IMPLANT
MAT ABSORB  FLUID 56X50 GRAY (MISCELLANEOUS) ×1
MAT ABSORB FLUID 56X50 GRAY (MISCELLANEOUS) ×1 IMPLANT
NDL SAFETY ECLIPSE 18X1.5 (NEEDLE) ×1 IMPLANT
NEEDLE HYPO 18GX1.5 SHARP (NEEDLE) ×1
NEEDLE SPNL 20GX3.5 QUINCKE YW (NEEDLE) ×4 IMPLANT
NOZZLE FLEX THIN (MISCELLANEOUS) ×2 IMPLANT
NS IRRIG 1000ML POUR BTL (IV SOLUTION) IMPLANT
PACK HIP COMPR (MISCELLANEOUS) ×2 IMPLANT
PRESSURIZER CEMENT PROX FEM SM (MISCELLANEOUS) ×2 IMPLANT
SCALPEL PROTECTED #10 DISP (BLADE) ×4 IMPLANT
SOL PREP PVP 2OZ (MISCELLANEOUS) ×2
SOLUTION PREP PVP 2OZ (MISCELLANEOUS) ×1 IMPLANT
SPONGE DRAIN TRACH 4X4 STRL 2S (GAUZE/BANDAGES/DRESSINGS) IMPLANT
STAPLER SKIN PROX 35W (STAPLE) ×2 IMPLANT
STEM FEMORAL STD SZ0 (Stem) ×2 IMPLANT
STRAP SAFETY 5IN WIDE (MISCELLANEOUS) ×2 IMPLANT
SUT DVC 2 QUILL PDO  T11 36X36 (SUTURE) ×1
SUT DVC 2 QUILL PDO T11 36X36 (SUTURE) ×1 IMPLANT
SUT SILK 0 (SUTURE) ×1
SUT SILK 0 30XBRD TIE 6 (SUTURE) ×1 IMPLANT
SUT V-LOC 90 ABS DVC 3-0 CL (SUTURE) ×2 IMPLANT
SUT VIC AB 1 CT1 36 (SUTURE) ×2 IMPLANT
SYR 20ML LL LF (SYRINGE) ×2 IMPLANT
SYR 30ML LL (SYRINGE) ×2 IMPLANT
SYR 50ML LL SCALE MARK (SYRINGE) ×4 IMPLANT
SYR BULB IRRIG 60ML STRL (SYRINGE) ×2 IMPLANT
TAPE MICROFOAM 4IN (TAPE) ×2 IMPLANT
TOWEL OR 17X26 4PK STRL BLUE (TOWEL DISPOSABLE) ×2 IMPLANT
TRAY FOLEY MTR SLVR 16FR STAT (SET/KITS/TRAYS/PACK) IMPLANT

## 2020-03-25 NOTE — Progress Notes (Signed)
PROGRESS NOTE    Faith Vasquez  WUJ:[id number] DOB: 1922-11-14 DOA: 03/24/2020 PCP: Patient, No Pcp Per    Assessment & Plan:   Principal Problem:   Left displaced femoral neck fracture (HCC) Active Problems:   GERD (gastroesophageal reflux disease)    Faith Vasquez is a 86 y.o. female with no significant past medical history who was brought into the emergency room after a fall at home.    Left displaced femoral neck fracture S/p left hip ARTHROPLASTY on 03/25/20 --pain control per ortho --PT/OT  GERD Continue Pepcid   DVT prophylaxis: Lovenox SQ Code Status: Full code  Family Communication: son updated at bedside today Status is: inpatient Dispo:   The patient is from: home Anticipated d/c is to: SNF Anticipated d/c date is: 1-2 days Patient currently is not medically stable to d/c due to: just post-op today, need PT eval   Subjective and Interval History:  Pt underwent left hip ARTHROPLASTY today.  Tolerated it well.   Objective: Vitals:   03/25/20 1049 03/25/20 1124 03/25/20 1240 03/25/20 1414  BP: 138/60 134/76 (!) 150/87 (!) 147/81  Pulse: 63 62 80 96  Resp: 20 18 16 16   Temp:  (!) 97.5 F (36.4 C) 97.7 F (36.5 C) (!) 97.1 F (36.2 C)  TempSrc:  Axillary Oral   SpO2: 95% 94% 92% 95%  Weight:      Height:        Intake/Output Summary (Last 24 hours) at 03/25/2020 1508 Last data filed at 03/25/2020 1429 Gross per 24 hour  Intake 650 ml  Output 1350 ml  Net -700 ml   Filed Weights   03/24/20 1057  Weight: 59 kg    Examination:   Constitutional: NAD, AAOx3 HEENT: conjunctivae and lids normal, EOMI CV: No cyanosis.   RESP: normal respiratory effort, on RA Extremities: No effusions, edema in BLE SKIN: warm, dry and intact Neuro: II - XII grossly intact.   Psych: Normal mood and affect.  Appropriate judgement and reason   Data Reviewed: I have personally reviewed following labs and imaging studies  CBC: Recent Labs  Lab  03/24/20 1058 03/25/20 0311  WBC 6.9 7.6  HGB 14.6 13.3  HCT 44.5 39.8  MCV 85.6 85.4  PLT 143* 134*   Basic Metabolic Panel: Recent Labs  Lab 03/24/20 1058 03/25/20 0311  NA 140 139  K 4.1 3.7  CL 105 105  CO2 24 25  GLUCOSE 236* 115*  BUN 28* 30*  CREATININE 1.44* 1.28*  CALCIUM 9.8 9.5   GFR: Estimated Creatinine Clearance: 24.2 mL/min (A) (by C-G formula based on SCr of 1.28 mg/dL (H)). Liver Function Tests: No results for input(s): AST, ALT, ALKPHOS, BILITOT, PROT, ALBUMIN in the last 168 hours. No results for input(s): LIPASE, AMYLASE in the last 168 hours. No results for input(s): AMMONIA in the last 168 hours. Coagulation Profile: No results for input(s): INR, PROTIME in the last 168 hours. Cardiac Enzymes: No results for input(s): CKTOTAL, CKMB, CKMBINDEX, TROPONINI in the last 168 hours. BNP (last 3 results) No results for input(s): PROBNP in the last 8760 hours. HbA1C: No results for input(s): HGBA1C in the last 72 hours. CBG: No results for input(s): GLUCAP in the last 168 hours. Lipid Profile: No results for input(s): CHOL, HDL, LDLCALC, TRIG, CHOLHDL, LDLDIRECT in the last 72 hours. Thyroid Function Tests: No results for input(s): TSH, T4TOTAL, FREET4, T3FREE, THYROIDAB in the last 72 hours. Anemia Panel: No results for input(s): VITAMINB12, FOLATE, FERRITIN, TIBC, IRON, RETICCTPCT  in the last 72 hours. Sepsis Labs: No results for input(s): PROCALCITON, LATICACIDVEN in the last 168 hours.  Recent Results (from the past 240 hour(s))  Respiratory Panel by RT PCR (Flu A&B, Covid) - Nasopharyngeal Swab     Status: None   Collection Time: 03/24/20 12:35 PM   Specimen: Nasopharyngeal Swab  Result Value Ref Range Status   SARS Coronavirus 2 by RT PCR NEGATIVE NEGATIVE Final    Comment: (NOTE) SARS-CoV-2 target nucleic acids are NOT DETECTED.  The SARS-CoV-2 RNA is generally detectable in upper respiratoy specimens during the acute phase of infection.  The lowest concentration of SARS-CoV-2 viral copies this assay can detect is 131 copies/mL. A negative result does not preclude SARS-Cov-2 infection and should not be used as the sole basis for treatment or other patient management decisions. A negative result may occur with  improper specimen collection/handling, submission of specimen other than nasopharyngeal swab, presence of viral mutation(s) within the areas targeted by this assay, and inadequate number of viral copies (<131 copies/mL). A negative result must be combined with clinical observations, patient history, and epidemiological information. The expected result is Negative.  Fact Sheet for Patients:  https://www.moore.com/  Fact Sheet for Healthcare Providers:  https://www.young.biz/  This test is no t yet approved or cleared by the [place] FDA and  has been authorized for detection and/or diagnosis of SARS-CoV-2 by FDA under an Emergency Use Authorization (EUA). This EUA will remain  in effect (meaning this test can be used) for the duration of the COVID-19 declaration under Section 564(b)(1) of the Act, 21 U.S.C. section 360bbb-3(b)(1), unless the authorization is terminated or revoked sooner.     Influenza A by PCR NEGATIVE NEGATIVE Final   Influenza B by PCR NEGATIVE NEGATIVE Final    Comment: (NOTE) The Xpert Xpress SARS-CoV-2/FLU/RSV assay is intended as an aid in  the diagnosis of influenza from Nasopharyngeal swab specimens and  should not be used as a sole basis for treatment. Nasal washings and  aspirates are unacceptable for Xpert Xpress SARS-CoV-2/FLU/RSV  testing.  Fact Sheet for Patients: https://www.moore.com/  Fact Sheet for Healthcare Providers: https://www.young.biz/  This test is not yet approved or cleared by the [place] FDA and  has been authorized for detection and/or diagnosis of SARS-CoV-2 by  FDA  under an Emergency Use Authorization (EUA). This EUA will remain  in effect (meaning this test can be used) for the duration of the  Covid-19 declaration under Section 564(b)(1) of the Act, 21  U.S.C. section 360bbb-3(b)(1), unless the authorization is  terminated or revoked. Performed at [facility], [address], [place], [place] [address]       Radiology Studies: DG Chest 1 View  Result Date: 03/24/2020 CLINICAL DATA:  Mechanical fall this morning with left hip pain. EXAM: CHEST  1 VIEW COMPARISON:  None. FINDINGS: The heart size is normal. Vascular calcifications are seen in the aortic arch. Both lungs are clear. The visualized skeletal structures are unremarkable. IMPRESSION: No active disease. Electronically Signed   By: Romona Curls M.D.   On: 03/24/2020 12:48   CT Head Wo Contrast  Result Date: 03/24/2020 CLINICAL DATA:  Mechanical fall with head trauma and pain. EXAM: CT HEAD WITHOUT CONTRAST TECHNIQUE: Contiguous axial images were obtained from the base of the skull through the vertex without intravenous contrast. COMPARISON:  None. FINDINGS: Brain: No evidence of acute infarction, hemorrhage, hydrocephalus, extra-axial collection or mass lesion/mass effect. A chronic lacunar infarct is seen in the genu  of the left internal capsule. There is mild cerebral volume loss with associated ex vacuo dilatation. Periventricular white matter hypoattenuation likely represents chronic small vessel ischemic disease. Vascular: There are vascular calcifications in the carotid siphons. Skull: Normal. Negative for fracture or focal lesion. Sinuses/Orbits: No acute finding. Other: None. IMPRESSION: 1. No acute intracranial process. Electronically Signed   By: Romona Curls M.D.   On: 03/24/2020 12:14   DG HIP OPERATIVE UNILAT WITH PELVIS LEFT  Result Date: 03/25/2020 CLINICAL DATA:  Operative imaging for left hip arthroplasty. EXAM: OPERATIVE LEFT HIP (WITH PELVIS IF PERFORMED) 5 VIEWS  TECHNIQUE: Fluoroscopic spot image(s) were submitted for interpretation post-operatively. COMPARISON:  03/24/2020 FINDINGS: Portable images show placement of a left hip arthroplasty with the components well seated and aligned. IMPRESSION: Well-positioned left hip arthroplasty. Electronically Signed   By: Amie Portland M.D.   On: 03/25/2020 10:29   DG HIP UNILAT W OR W/O PELVIS 2-3 VIEWS LEFT  Result Date: 03/25/2020 CLINICAL DATA:  Postop pain EXAM: DG HIP (WITH OR WITHOUT PELVIS) 2-3V LEFT COMPARISON:  None. FINDINGS: Patient is status post left hip replacement. Hardware is in good position. No evidence of failure. Soft tissue gas in the left is consistent with recent surgery. No other acute abnormalities. IMPRESSION: Patient is status post left hip replacement. Postop changes are noted. Electronically Signed   By: Gerome Sam III M.D   On: 03/25/2020 10:50   DG HIP UNILAT WITH PELVIS 2-3 VIEWS LEFT  Result Date: 03/24/2020 CLINICAL DATA:  Left hip pain EXAM: DG HIP (WITH OR WITHOUT PELVIS) 2-3V LEFT COMPARISON:  None. FINDINGS: There is an impacted transcervical left femoral neck fracture. The humeral head articulates with the acetabulum and there is no hip dislocation. Mild degenerative changes are seen in both hips. Stool overlies the rectum. Degenerative changes are seen in the spine. IMPRESSION: Impacted transcervical left femoral neck fracture. Electronically Signed   By: Romona Curls M.D.   On: 03/24/2020 12:38     Scheduled Meds: . acidophilus  1 capsule Oral Daily  . aspirin  81 mg Oral Daily  . atorvastatin  20 mg Oral QHS  . Chlorhexidine Gluconate Cloth  6 each Topical Daily  . docusate sodium  100 mg Oral BID  . [START ON 03/26/2020] enoxaparin (LOVENOX) injection  30 mg Subcutaneous Q24H  . famotidine  40 mg Oral BID  . [START ON 03/26/2020] feeding supplement  237 mL Oral BID BM  . multivitamin-lutein  1 capsule Oral Daily  . sodium chloride flush      . traMADol  50 mg  Oral Q6H   Continuous Infusions: . sodium chloride 100 mL/hr at 03/25/20 1140  .  ceFAZolin (ANCEF) IV    . methocarbamol (ROBAXIN) IV       LOS: 1 day     Darlin Priestly, MD [facility] If 7PM-7AM, please contact night-coverage 03/25/2020, 3:08 PM

## 2020-03-25 NOTE — Anesthesia Preprocedure Evaluation (Addendum)
Anesthesia Evaluation  Patient identified by MRN, date of birth, ID band Patient awake    Reviewed: Allergy & Precautions, NPO status , Patient's Chart, lab work & pertinent test results  History of Anesthesia Complications Negative for: history of anesthetic complications  Airway Mallampati: II       Dental   Pulmonary neg sleep apnea, neg COPD, Not current smoker,           Cardiovascular (-) hypertension(-) Past MI and (-) CHF (-) dysrhythmias (-) Valvular Problems/Murmurs     Neuro/Psych neg Seizures    GI/Hepatic Neg liver ROS, neg GERD  ,  Endo/Other  neg diabetes  Renal/GU negative Renal ROS     Musculoskeletal   Abdominal   Peds  Hematology   Anesthesia Other Findings   Reproductive/Obstetrics                            Anesthesia Physical Anesthesia Plan  ASA: II  Anesthesia Plan: Spinal   Post-op Pain Management:    Induction:   PONV Risk Score and Plan:   Airway Management Planned: Nasal Cannula  Additional Equipment:   Intra-op Plan:   Post-operative Plan:   Informed Consent: I have reviewed the patients History and Physical, chart, labs and discussed the procedure including the risks, benefits and alternatives for the proposed anesthesia with the patient or authorized representative who has indicated his/her understanding and acceptance.       Plan Discussed with:   Anesthesia Plan Comments:         Anesthesia Quick Evaluation

## 2020-03-25 NOTE — Progress Notes (Signed)
PHARMACIST - PHYSICIAN COMMUNICATION  CONCERNING:  Enoxaparin (Lovenox) for DVT Prophylaxis    RECOMMENDATION: Patient was prescribed enoxaprin 40mg  q24 hours for VTE prophylaxis.   Filed Weights   03/24/20 1057  Weight: 59 kg (130 lb)    Body mass index is 21.63 kg/m.  Estimated Creatinine Clearance: 24.2 mL/min (A) (by C-G formula based on SCr of 1.28 mg/dL (H)).   Patient is candidate for enoxaparin 30mg  every 24 hours based on CrCl <72ml/min or Weight <45kg  DESCRIPTION: Pharmacy has adjusted enoxaparin dose per [facility] policy.  Patient is now receiving enoxaparin 30 mg every 24 hours    31m, PharmD Clinical Pharmacist  03/25/2020 12:59 PM

## 2020-03-25 NOTE — Progress Notes (Signed)
PT Cancellation Note  Patient Details Name: Faith Vasquez MRN: [id number] DOB: 1922/12/31   Cancelled Treatment:     PT consult orders received, chart reviewed. Attempted to see pt for PT evaluation but per nurse & family member pt just received pain medication and is finally sleeping, family more pleased that PT can return tomorrow. Will attempt evaluation later.  Aleda Grana, PT, DPT 03/25/20, 3:57 PM    Sandi Mariscal 03/25/2020, 3:56 PM

## 2020-03-25 NOTE — Op Note (Signed)
03/25/2020  10:08 AM  PATIENT:  Faith Vasquez  86 y.o. female  PRE-OPERATIVE DIAGNOSIS:  Left femoral neck fracture displaced  POST-OPERATIVE DIAGNOSIS:   same  PROCEDURE:  Procedure(s): ARTHROPLASTY BIPOLAR HIP (HEMIARTHROPLASTY) (Left)  SURGEON: Leitha Schuller, MD  ASSISTANTS: none  ANESTHESIA:   spinal  EBL:  Total I/O In: 400 [I.V.:300; IV Piggyback:100] Out: 100 [Urine:75; Blood:25]  BLOOD ADMINISTERED:none  DRAINS: Incisional wound VAC   LOCAL MEDICATIONS USED:  MARCAINE     SPECIMEN:  Source of Specimen:  Left femoral head and neck  DISPOSITION OF SPECIMEN:  PATHOLOGY  COUNTS:  YES  TOURNIQUET:  * No tourniquets in log *  IMPLANTS: Medacta AMIS 0 standard cemented stem with metal S 28 mm head, 46 mm bipolar head.  Cement plug and cement  DICTATION: .Dragon Dictation   The patient was brought to the operating room and after spinal anesthesia was obtained patient was placed on the operative table with the ipsilateral foot into the Medacta attachment, contralateral leg on a well-padded table. C-arm was brought in and preop template x-ray taken. After prepping and draping in usual sterile fashion appropriate patient identification and timeout procedures were completed. Anterior approach to the hip was obtained and centered over the greater trochanter and TFL muscle. The subcutaneous tissue was incised hemostasis being achieved by electrocautery. TFL fascia was incised and the muscle retracted laterally deep retractor placed. The lateral femoral circumflex vessels were identified and ligated. The anterior capsule was exposed and a capsulotomy performed. The neck was identified and a femoral neck cut carried out with a saw below the level of the fracture. The head was removed without difficulty and showed mild degenerative changes it sized to a 46 and a 46 trial fit well. . The leg was then externally rotated and ischiofemoral and pubofemoral releases carried out. The  femur was sequentially broached to a size 1, size with x-ray showing good position the 0 cemented stem was chosen.  Cement was mixed and after cement plug was placed on the canal cement was pressurized. The 0 standard stem was inserted along with a metal S 28 mm head and 46 mm l bipolar head. The hip was reduced and was stable the wound was thoroughly irrigated with fibrillar placed along the posterior capsule and medial neck. The deep fascia ws closed using a heavy Quill after infiltration of 30 cc of quarter percent Sensorcaine with epinephrine.3-0 V-loc to close the skin with skin staples.  Incisional wound VAC applied and patient was sent to recovery in stable condition.   PLAN OF CARE: Admit to inpatient

## 2020-03-25 NOTE — Plan of Care (Signed)

## 2020-03-25 NOTE — Transfer of Care (Signed)
Immediate Anesthesia Transfer of Care Note  Patient: Faith Vasquez  Procedure(s) Performed: ARTHROPLASTY BIPOLAR HIP (HEMIARTHROPLASTY) (Left Hip)  Patient Location: PACU  Anesthesia Type:Spinal  Level of Consciousness: awake, alert  and oriented  Airway & Oxygen Therapy: Patient Spontanous Breathing and Patient connected to nasal cannula oxygen  Post-op Assessment: Report given to RN and Post -op Vital signs reviewed and stable  Post vital signs: Reviewed and stable  Last Vitals:  Vitals Value Taken Time  BP 120/61 03/25/20 1007  Temp 36.5 C 03/25/20 1004  Pulse 64 03/25/20 1010  Resp 21 03/25/20 1010  SpO2 95 % 03/25/20 1010  Vitals shown include unvalidated device data.  Last Pain:  Vitals:   03/25/20 1007  TempSrc:   PainSc: Asleep         Complications: No complications documented.

## 2020-03-25 NOTE — Progress Notes (Signed)
Initial Nutrition Assessment  RD working remotely.  DOCUMENTATION CODES:   Not applicable  INTERVENTION:   Once diet is advanced:  -Continue MVI daily -Ensure Enlive po BID, each supplement provides 350 kcal and 20 grams of protein  NUTRITION DIAGNOSIS:   Increased nutrient needs related to post-op healing as evidenced by estimated needs.  GOAL:   Patient will meet greater than or equal to 90% of their needs  MONITOR:   PO intake, Supplement acceptance, Diet advancement, Labs, Weight trends, Skin, I & O's  REASON FOR ASSESSMENT:   Consult Assessment of nutrition requirement/status, Wound healing, Hip fracture protocol  ASSESSMENT:   Faith Vasquez is a 86 y.o. femalewith no significant past medical history who was brought into the emergency room after a fall at home.  Patient's son states that she recently moved to [place] from [place], [place] with plans to move to an assisted living facility  Pt admitted with lt displaced femoral neck fracture s/p fall.   Reviewed I/O's: -650 ml x 24 hours  UOP: 650 ml x 24 hours  Per orthopedics notes, plan for lt hip hemiarthroplasty today. Pt currently in OR at time of attempted contact.   Reviewed wt hx; pt has a distant hx of weight loss. However, no recent wt hx to assess at this time per reviewed of CareEverywhere records.   Pt with increased nutritional needs for post-operative healing and would benefit from addition of oral nutrition supplements.   Labs reviewed.   Diet Order:   Diet Order            Diet NPO time specified  Diet effective midnight                 EDUCATION NEEDS:   No education needs have been identified at this time  Skin:  Skin Assessment: Reviewed RN Assessment  Last BM:  Unknown  Height:   Ht Readings from Last 1 Encounters:  03/24/20 5\' 5"  (1.651 m)    Weight:   Wt Readings from Last 1 Encounters:  03/24/20 59 kg    Ideal Body Weight:  56.8 kg  BMI:  Body mass  index is 21.63 kg/m.  Estimated Nutritional Needs:   Kcal:  1550-1750  Protein:  70-85 grams  Fluid:  > 1.5 L    03/26/20, RD, LDN, CDCES Registered Dietitian II Certified Diabetes Care and Education Specialist Please refer to [facility] for RD and/or RD on-call/weekend/after hours pager

## 2020-03-26 ENCOUNTER — Encounter: Payer: Self-pay | Admitting: Orthopedic Surgery

## 2020-03-26 ENCOUNTER — Other Ambulatory Visit: Payer: Self-pay

## 2020-03-26 LAB — BASIC METABOLIC PANEL
Anion gap: 6 (ref 5–15)
BUN: 28 mg/dL — ABNORMAL HIGH (ref 8–23)
CO2: 26 mmol/L (ref 22–32)
Calcium: 8.6 mg/dL — ABNORMAL LOW (ref 8.9–10.3)
Chloride: 107 mmol/L (ref 98–111)
Creatinine, Ser: 1.05 mg/dL — ABNORMAL HIGH (ref 0.44–1.00)
GFR, Estimated: 49 mL/min — ABNORMAL LOW (ref >60)
Glucose, Bld: 105 mg/dL — ABNORMAL HIGH (ref 70–99)
Potassium: 4 mmol/L (ref 3.5–5.1)
Sodium: 139 mmol/L (ref 135–145)

## 2020-03-26 LAB — CBC
HCT: 37.6 % (ref 36.0–46.0)
Hemoglobin: 12.3 g/dL (ref 12.0–15.0)
MCH: 28.1 pg (ref 26.0–34.0)
MCHC: 32.7 g/dL (ref 30.0–36.0)
MCV: 86 fL (ref 80.0–100.0)
Platelets: 114 10*3/uL — ABNORMAL LOW (ref 150–400)
RBC: 4.37 MIL/uL (ref 3.87–5.11)
RDW: 14.2 % (ref 11.5–15.5)
WBC: 7.3 10*3/uL (ref 4.0–10.5)
nRBC: 0 % (ref 0.0–0.2)

## 2020-03-26 LAB — GLUCOSE, CAPILLARY: Glucose-Capillary: 92 mg/dL (ref 70–99)

## 2020-03-26 LAB — MAGNESIUM: Magnesium: 2.3 mg/dL (ref 1.7–2.4)

## 2020-03-26 MED ORDER — MELATONIN 5 MG PO TABS
2.5000 mg | ORAL_TABLET | Freq: Every evening | ORAL | Status: DC | PRN
  Filled 2020-03-26: qty 1

## 2020-03-26 MED ORDER — POLYETHYLENE GLYCOL 3350 17 G PO PACK
17.0000 g | PACK | Freq: Two times a day (BID) | ORAL | Status: DC
  Administered 2020-03-26 – 2020-03-27 (×2): 17 g via ORAL
  Filled 2020-03-26 (×2): qty 1

## 2020-03-26 MED ORDER — HYDROCODONE-ACETAMINOPHEN 5-325 MG PO TABS
1.0000 | ORAL_TABLET | ORAL | Status: DC | PRN
  Administered 2020-03-26 (×2): 1 via ORAL
  Filled 2020-03-26 (×2): qty 1

## 2020-03-26 NOTE — Progress Notes (Signed)
   Subjective: 1 Day Post-Op Procedure(s) (LRB): ARTHROPLASTY BIPOLAR HIP (HEMIARTHROPLASTY) (Left) Patient reports pain as mild.   Patient is well, and has had no acute complaints or problems Denies any CP, SOB, ABD pain. We will continue therapy today.   Objective: Vital signs in last 24 hours: Temp:  [97.1 F (36.2 C)-98.3 F (36.8 C)] 97.8 F (36.6 C) (11/01 0745) Pulse Rate:  [59-96] 76 (11/01 0745) Resp:  [16-25] 18 (11/01 0745) BP: (89-150)/(48-87) 134/66 (11/01 0745) SpO2:  [90 %-95 %] 93 % (11/01 0745)  Intake/Output from previous day: 10/31 0701 - 11/01 0700 In: 1841.1 [P.O.:240; I.V.:1401.1; IV Piggyback:200] Out: 1350 [Urine:1325; Blood:25] Intake/Output this shift: No intake/output data recorded.  Recent Labs    03/24/20 1058 03/25/20 0311 03/26/20 0406  HGB 14.6 13.3 12.3   Recent Labs    03/25/20 0311 03/26/20 0406  WBC 7.6 7.3  RBC 4.66 4.37  HCT 39.8 37.6  PLT 134* 114*   Recent Labs    03/25/20 0311 03/26/20 0406  NA 139 139  K 3.7 4.0  CL 105 107  CO2 25 26  BUN 30* 28*  CREATININE 1.28* 1.05*  GLUCOSE 115* 105*  CALCIUM 9.5 8.6*   No results for input(s): LABPT, INR in the last 72 hours.  EXAM General - Patient is Alert, Appropriate and Oriented Extremity - Neurovascular intact Sensation intact distally Intact pulses distally Dorsiflexion/Plantar flexion intact No cellulitis present Compartment soft Dressing - dressing C/D/I and no drainage, Praveena intact without drainage Motor Function - intact, moving foot and toes well on exam.   Past Medical History:  Diagnosis Date  . Diverticulosis   . History of Clostridium difficile infection    uncertain dates, possibly in 2017 or 2018    Assessment/Plan:   1 Day Post-Op Procedure(s) (LRB): ARTHROPLASTY BIPOLAR HIP (HEMIARTHROPLASTY) (Left) Principal Problem:   Left displaced femoral neck fracture (HCC) Active Problems:   GERD (gastroesophageal reflux  disease)  Estimated body mass index is 21.63 kg/m as calculated from the following:   Height as of this encounter: 5\' 5"  (1.651 m).   Weight as of this encounter: 59 kg. Advance diet Up with therapy  Pain well controlled.  Labs and vital signs are stable Care management to assist with discharge   DVT Prophylaxis - Lovenox, TED hose and SCDs Weight-Bearing as tolerated to left leg   T. , PA-C [facility] 03/26/2020, 8:18 AM

## 2020-03-26 NOTE — Progress Notes (Addendum)
Physical Therapy Treatment Patient Details Name: Faith Vasquez MRN: [id number] DOB: [birth year] Today's Date: 03/26/2020    History of Present Illness Pt is a 86 y/o F who recently moved to [place] from [place], [place] with anticipation of moving into [facility]. Pt admitted on 03/24/2020 after a fall at home. X ray revealed an impacted transcervical L femoral neck fx. Pt underwent L hemiarthroplasty on 03/25/20. PMH: diverticulosis. Pt's family reports recent hx of UTI & "stroke"    PT Comments    Pt alert & agreeable to tx. Pt requires less assistance for bed mobility during this session & is able to progress to sit<>stand and stand pivot with RW with cuing and mod assist. Pt is able to march in place with RW & min assist with focus on weight shifting L<>R & lifting LLE off of floor but when attempting stand pivot to recliner pt with decreased weight shifting to L and minimal RLE foot clearance. Pt performs LLE LAQ 1 set x 10 reps with multimodal cuing with more AROM compared to AM session but still unable to move through full ROM. Will continue to follow pt acutely to focus on transfers & gait.   Pt on 3L/min supplemental oxygen via nasal cannula at rest upon PT arrival & SpO2 >90%. Pt placed on room air & SpO2 dropped to 89%, pt placed back on 2L/min for remainder of session & SpO2 >90% - nurse made aware.  After transferring to recliner BP = 136/75 mmHg (RUE), HR = 78 bpm.      SNF;Supervision/Assistance - 24 hour     Equipment Recommendations  None recommended by PT (TBD in next venue)    Recommendations for Other Services       Precautions / Restrictions Precautions Precautions: Fall Precaution Comments: L anterior hip (no precautions) Restrictions Weight Bearing Restrictions: Yes LLE Weight Bearing: Weight bearing as tolerated    Mobility  Bed Mobility Overal bed mobility: Needs Assistance Bed Mobility: Supine to Sit     Supine to sit: Min assist    General  bed mobility comments: Pt more alert during session, able to complete bed mobility with HOB elevated then flat with not as much extra time required compared to this AM.  Transfers Overall transfer level: Needs assistance Equipment used: Rolling walker (2 wheeled) Transfers: Sit to/from [employer] Sit to Stand: From elevated surface;Mod assist Stand pivot transfers: Min assist;From elevated surface       General transfer comment: Pt requires multimodal cuing for safe hand placement for sit>stand transfers. Pt requires cuing to lift RLE off of floor & step to recliner on R during stand pivot transfer.  Ambulation/Gait                 Stairs             Wheelchair Mobility    Modified Rankin (Stroke Patients Only)       Balance Overall balance assessment: Needs assistance Sitting-balance support: Feet supported;Bilateral upper extremity supported Sitting balance-Leahy Scale: Fair     Standing balance support: During functional activity;Bilateral upper extremity supported Standing balance-Leahy Scale: Poor Standing balance comment: BUE support on RW in standing                            Cognition Arousal/Alertness: Awake/alert Behavior During Therapy: WFL for tasks assessed/performed Overall Cognitive Status: Impaired/Different from baseline Area of Impairment: Orientation;Attention;Memory;Following commands;Safety/judgement;Awareness;Problem solving  Orientation Level: Disoriented to;Place;Time;Situation Current Attention Level: Sustained Memory: Decreased short-term memory Following Commands: Follows one step commands with increased time;Follows one step commands consistently Safety/Judgement: Decreased awareness of safety;Decreased awareness of deficits Awareness: Intellectual   General Comments: Pt asking "why are we doing this?" with PT & son continuously educating pt on rehab following THA       Exercises      General Comments        Pertinent Vitals/Pain Pain Assessment: Faces Faces Pain Scale: Hurts little more Pain Location: L hip/groin Pain Descriptors / Indicators: Grimacing;Guarding;Sore Pain Intervention(s): Limited activity within patient's tolerance;Monitored during session;Repositioned    Home Living Family/patient expects to be discharged to:: Skilled nursing facility               Additional Comments: family hoping for STR then transition to ALF    Prior Function Level of Independence: Independent      Comments: until 5 weeks ago when pt had UTI & "stroke" per family   PT Goals (current goals can now be found in the care plan section) Acute Rehab PT Goals Patient Stated Goal: decreased pain PT Goal Formulation: With patient Time For Goal Achievement: 04/09/20 Progress towards PT goals: Progressing toward goals    Frequency    BID      PT Plan Current plan remains appropriate    Co-evaluation              AM-PAC PT "6 Clicks" Mobility   Outcome Measure  Help needed turning from your back to your side while in a flat bed without using bedrails?: A Little Help needed moving from lying on your back to sitting on the side of a flat bed without using bedrails?: A Little Help needed moving to and from a bed to a chair (including a wheelchair)?: A Little Help needed standing up from a chair using your arms (e.g., wheelchair or bedside chair)?: A Lot Help needed to walk in hospital room?: A Lot Help needed climbing 3-5 steps with a railing? : Total 6 Click Score: 14    End of Session Equipment Utilized During Treatment: Oxygen;Gait belt Activity Tolerance: Patient tolerated treatment well Patient left: in chair;with chair alarm set;with call bell/phone within reach;with family/visitor present;with SCD's reapplied Nurse Communication: Mobility status (oxygen) PT Visit Diagnosis: Other abnormalities of gait and mobility (R26.89);Muscle  weakness (generalized) (M62.81);Difficulty in walking, not elsewhere classified (R26.2)     Time: 1331-1401 PT Time Calculation (min) (ACUTE ONLY): 30 min  Charges:  $Therapeutic Activity: 23-37 mins                     Faith Vasquez, PT, DPT 03/26/20, 3:08 PM    Faith Vasquez 03/26/2020, 3:03 PM

## 2020-03-26 NOTE — TOC Initial Note (Signed)
Transition of Care [facility]) - Initial/Assessment Note    Patient Details  Name: Faith Vasquez MRN: [id number] Date of Birth: 05/02/1923  Transition of Care [facility]) CM/SW Contact:    Trenton Founds, RN Phone Number: 03/26/2020, 12:35 PM  Clinical Narrative:   Patient admitted to hospital post mechanical fall at son's home and required surgical repair for Left Femoral neck fracture. RNCM spoke with both son Harvie Heck and daughter in law [name] by telephone to discuss discharge planning. Patient has only been in town for a few weeks, she is from [place] and the plan was for her to transition to ALF at [facility] when a bed was available. Both Harvie Heck and [name] have verbalized there wishes for patient to be able to be placed at at [facility]. Did discuss however that this was not likely possible as [facility] has reported they have no beds available. After some discussion [name] is agreeable to send bed requests to both [facility] and [facility] but also verbalized that it may be there decision to take patient home. RNCM completed PASSR, FL-2 and sent bed requests to facilities as well as reaching out to [place] with [facility] who confirms they don't have any available beds. RNCM will continue to follow.       Expected Discharge Plan: Skilled Nursing Facility Barriers to Discharge: No Barriers Identified   Patient Goals and CMS Choice        Expected Discharge Plan and Services Expected Discharge Plan: Skilled Nursing Facility     Post Acute Care Choice: Skilled Nursing Facility Living arrangements for the past 2 months: Single Family Home                                      Prior Living Arrangements/Services Living arrangements for the past 2 months: Single Family Home Lives with:: Adult Children Patient language and need for interpreter reviewed:: Yes Do you feel safe going back to the place where you live?: Yes      Need for Family Participation in Patient Care: Yes  (Comment) Care giver support system in place?: Yes (comment)   Criminal Activity/Legal Involvement Pertinent to Current Situation/Hospitalization: No - Comment as needed  Activities of Daily Living Home Assistive Devices/Equipment: Dan Humphreys (specify type) ADL Screening (condition at time of admission) Patient's cognitive ability adequate to safely complete daily activities?: Yes Is the patient deaf or have difficulty hearing?: Yes Does the patient have difficulty seeing, even when wearing glasses/contacts?: No Does the patient have difficulty concentrating, remembering, or making decisions?: Yes Patient able to express need for assistance with ADLs?: Yes Does the patient have difficulty dressing or bathing?: No Independently performs ADLs?: Yes (appropriate for developmental age) Does the patient have difficulty walking or climbing stairs?: Yes Weakness of Legs: Both Weakness of Arms/Hands: None  Permission Sought/Granted                  Emotional Assessment         Alcohol / Substance Use: Not Applicable Psych Involvement: No (comment)  Admission diagnosis:  Fall [W19.XXXA] Closed left hip fracture (HCC) [S72.002A] Left displaced femoral neck fracture (HCC) [S72.002A] Closed fracture of left hip, initial encounter [facility]) [S72.002A] Patient Active Problem List   Diagnosis Date Noted  . Closed left hip fracture (HCC) 03/24/2020  . Left displaced femoral neck fracture (HCC) 03/24/2020  . GERD (gastroesophageal reflux disease) 03/24/2020  PCP:  Patient, No Pcp Per Pharmacy:   [id number] [place], [place] - [address] AT [facility] [address] [place] [address] Phone: [phone] Fax: [phone]     Social Determinants of Health (SDOH) Interventions    Readmission Risk Interventions No flowsheet data found.

## 2020-03-26 NOTE — Evaluation (Signed)
Occupational Therapy Evaluation Patient Details Name: Faith Vasquez MRN: [id number] DOB: Aug 07, 1922 Today's Date: 03/26/2020    History of Present Illness Pt is a 86 y/o F who recently moved to [place] from [place], [place] with anticipation of moving into [place] [facility]. Pt admitted on 03/24/2020 after a fall at home. X ray revealed an impacted transcervical L femoral neck fx. Pt underwent L hemiarthroplasty on 03/25/20. PMH: diverticulosis. Pt's family reports recent hx of UTI & "stroke"   Clinical Impression   Patient presenting with decreased I in self care, balance, functional mobility/transfer, endurance, and safety awareness. Patient's son reports pt was living in ALF PTA with use of RW but has been declining in health since September of this year and recently living with him. Patient currently functioning at supervision/set up for UB self care and grooming on EOB, max A to stand, and max - total A for LB self care. Pt following 1 step commands inconsistently with increased time needed. Pt oriented to self only this session.  Patient will benefit from acute OT to increase overall independence in the areas of ADLs, functional mobility, and safety awarenes in order to safely discharge to next venue of care.    Follow Up Recommendations  SNF;Supervision/Assistance - 24 hour    Equipment Recommendations  Other (comment) (defer to next venue of care)       Precautions / Restrictions Precautions Precautions: Fall Precaution Comments: L anterior hip (no precautions) Restrictions Weight Bearing Restrictions: Yes LLE Weight Bearing: Weight bearing as tolerated      Mobility Bed Mobility Overal bed mobility: Needs Assistance Bed Mobility: Supine to Sit;Sit to Supine     Supine to sit: Max assist;HOB elevated Sit to supine: Total assist   General bed mobility comments: +2 assist for scooting to [facility]    Transfers Overall transfer level: Needs assistance Equipment used: 1 person  hand held assist Transfers: Sit to/from Stand Sit to Stand: Max assist         General transfer comment: pt crying out in pain with standing and able to maintain for ~ 1 minute    Balance Overall balance assessment: Needs assistance Sitting-balance support: Feet supported;Bilateral upper extremity supported Sitting balance-Leahy Scale: Fair     Standing balance support: During functional activity Standing balance-Leahy Scale: Poor     ADL either performed or assessed with clinical judgement   ADL Overall ADL's : Needs assistance/impaired Eating/Feeding: Set up;Supervision/ safety;Sitting   Grooming: Oral care;Wash/dry face;Sitting;Set up;Supervision/safety   Upper Body Bathing: Set up;Supervision/ safety;Sitting   Lower Body Bathing: Maximal assistance;Total assistance;Sit to/from stand   Upper Body Dressing : Set up;Supervision/safety;Sitting   Lower Body Dressing: Maximal assistance;Total assistance;Sit to/from stand      General ADL Comments: Pt required max A to get to EOB but able to hold self with close supervision for several minutes while performing grooming tasks. Increased pain noted with LE mobility and likely needing max - total A for LB self care.     Vision Baseline Vision/History: Wears glasses Wears Glasses: Reading only Patient Visual Report: No change from baseline              Pertinent Vitals/Pain Pain Assessment: Faces Pain Score: 7  Faces Pain Scale: Hurts whole lot Pain Location: L hip/groin Pain Descriptors / Indicators: Grimacing;Guarding;Sore;Sharp Pain Intervention(s): Limited activity within patient's tolerance;Monitored during session;Premedicated before session;Repositioned     Hand Dominance Right   Extremity/Trunk Assessment Upper Extremity Assessment Upper Extremity Assessment: Generalized weakness   Lower Extremity  Assessment Lower Extremity Assessment: Generalized weakness;LLE deficits/detail LLE Deficits / Details:  unable to perform LLE LAQ through full ROM 2/2 weakness & pain LLE: Unable to fully assess due to pain   Cervical / Trunk Assessment Cervical / Trunk Assessment: Kyphotic   Communication Communication Communication: HOH   Cognition Arousal/Alertness: Awake/alert Behavior During Therapy: WFL for tasks assessed/performed Overall Cognitive Status: Impaired/Different from baseline Area of Impairment: Orientation;Attention;Memory;Following commands;Safety/judgement;Awareness      Orientation Level: Disoriented to;Place;Time;Situation Current Attention Level: Sustained Memory: Decreased short-term memory Following Commands: Follows one step commands with increased time;Follows one step commands consistently Safety/Judgement: Decreased awareness of safety;Decreased awareness of deficits Awareness: Intellectual   General Comments: Pt unable to correctly answer orientation questions and son reports pt has been increasingly confused since UTI in sept of this year. She was living in ALF but now living with son.              Home Living Family/patient expects to be discharged to:: Skilled nursing facility      Additional Comments: family hoping for STR then transition to ALF      Prior Functioning/Environment Level of Independence: Independent        Comments: until 5 weeks ago when pt had UTI & "stroke" per family        OT Problem List: Decreased strength;Pain;Decreased cognition;Decreased safety awareness;Decreased activity tolerance;Decreased knowledge of use of DME or AE;Impaired balance (sitting and/or standing)      OT Treatment/Interventions: Self-care/ADL training;Therapeutic exercise;Therapeutic activities;Energy conservation;DME and/or AE instruction;Patient/family education;Balance training    OT Goals(Current goals can be found in the care plan section) Acute Rehab OT Goals Patient Stated Goal: decreased pain OT Goal Formulation: With patient/family Time For Goal  Achievement: 04/09/20 Potential to Achieve Goals: Good ADL Goals Pt Will Perform Grooming: with supervision;standing Pt Will Transfer to Toilet: with min assist;ambulating Pt Will Perform Toileting - Clothing Manipulation and hygiene: with min assist;sit to/from stand  OT Frequency: Min 1X/week   Barriers to D/C: Other (comment)  none known at this time          AM-PAC OT "6 Clicks" Daily Activity     Outcome Measure Help from another person eating meals?: A Little Help from another person taking care of personal grooming?: A Little Help from another person toileting, which includes using toliet, bedpan, or urinal?: A Lot Help from another person bathing (including washing, rinsing, drying)?: A Lot Help from another person to put on and taking off regular upper body clothing?: A Little Help from another person to put on and taking off regular lower body clothing?: Total 6 Click Score: 14   End of Session Nurse Communication: Mobility status  Activity Tolerance: Patient limited by pain Patient left: in bed;with call bell/phone within reach;with bed alarm set;with family/visitor present  OT Visit Diagnosis: Repeated falls (R29.6);Muscle weakness (generalized) (M62.81);Pain Pain - Right/Left: Left Pain - part of body: Hip                Time: 9449-6759 OT Time Calculation (min): 36 min Charges:  OT General Charges $OT Visit: 1 Visit OT Evaluation $OT Eval Low Complexity: 1 Low OT Treatments $Self Care/Home Management : 8-22 mins $Therapeutic Activity: 8-22 mins  Jackquline Denmark, MS, OTR/L , CBIS ascom [phone]  03/26/20, 12:55 PM

## 2020-03-26 NOTE — NC FL2 (Signed)
[place] MEDICAID FL2 LEVEL OF CARE SCREENING TOOL     IDENTIFICATION  Patient Name: Faith Vasquez Birthdate: 1923/05/09 Sex: female Admission Date (Current Location): 03/24/2020  [place] and [place] Number:  Chiropodist and Address:  [facility], [address], [place], [place] [address]      Provider Number: [id number]  Attending Physician Name and Address:  Darlin Priestly, MD  Relative Name and Phone Number:  Valjean Ruppel [phone]    Current Level of Care: Hospital Recommended Level of Care: Skilled Nursing Facility Prior Approval Number:    Date Approved/Denied:   PASRR Number: [id number]  Discharge Plan: SNF    Current Diagnoses: Patient Active Problem List   Diagnosis Date Noted  . Closed left hip fracture (HCC) 03/24/2020  . Left displaced femoral neck fracture (HCC) 03/24/2020  . GERD (gastroesophageal reflux disease) 03/24/2020    Orientation RESPIRATION BLADDER Height & Weight     Self, Time, Situation, Place  Normal External catheter Weight: 59 kg Height:  5\' 5"  (165.1 cm)  BEHAVIORAL SYMPTOMS/MOOD NEUROLOGICAL BOWEL NUTRITION STATUS      Continent Diet (Regular)  AMBULATORY STATUS COMMUNICATION OF NEEDS Skin   Extensive Assist Verbally Surgical wounds                       Personal Care Assistance Level of Assistance  Bathing, Feeding, Dressing Bathing Assistance: Limited assistance Feeding assistance: Independent Dressing Assistance: Limited assistance     Functional Limitations Info  Sight, Hearing, Speech Sight Info: Adequate Hearing Info: Adequate Speech Info: Adequate    SPECIAL CARE FACTORS FREQUENCY  PT (By licensed PT), OT (By licensed OT)                    Contractures Contractures Info: Not present    Additional Factors Info  Code Status, Allergies Code Status Info: Full Allergies Info: Codeine, Iodine, Ciprofloxacin           Current Medications (03/26/2020):   This is the current hospital active medication list Current Facility-Administered Medications  Medication Dose Route Frequency Provider Last Rate Last Admin  . acetaminophen (TYLENOL) tablet 325-650 mg  325-650 mg Oral Q6H PRN 13/05/2019, MD      . acidophilus (RISAQUAD) capsule 1 capsule  1 capsule Oral Daily Kennedy Bucker, MD   1 capsule at 03/26/20 0929  . alum & mag hydroxide-simeth (MAALOX/MYLANTA) 200-200-20 MG/5ML suspension 30 mL  30 mL Oral Q4H PRN 12-02-2000, MD      . aspirin chewable tablet 81 mg  81 mg Oral Daily Kennedy Bucker, MD   81 mg at 03/26/20 0929  . atorvastatin (LIPITOR) tablet 20 mg  20 mg Oral QHS 13/01/21, MD   20 mg at 03/25/20 2049  . bisacodyl (DULCOLAX) suppository 10 mg  10 mg Rectal Daily PRN 2050, MD      . Chlorhexidine Gluconate Cloth 2 % PADS 6 each  6 each Topical Daily Kennedy Bucker, MD   6 each at 03/26/20 0930  . diphenhydrAMINE (BENADRYL) 12.5 MG/5ML elixir 12.5-25 mg  12.5-25 mg Oral Q4H PRN 06-25-1984, MD      . docusate sodium (COLACE) capsule 100 mg  100 mg Oral BID Kennedy Bucker, MD   100 mg at 03/26/20 0929  . enoxaparin (LOVENOX) injection 30 mg  30 mg Subcutaneous Q24H 13/01/21, RPH   30 mg at 03/26/20 0930  . famotidine (PEPCID) tablet 40 mg  40  mg Oral BID Kennedy Bucker, MD   40 mg at 03/26/20 0929  . feeding supplement (ENSURE ENLIVE / ENSURE PLUS) liquid 237 mL  237 mL Oral BID BM Kennedy Bucker, MD   237 mL at 03/26/20 0933  . HYDROcodone-acetaminophen (NORCO/VICODIN) 5-325 MG per tablet 1-2 tablet  1-2 tablet Oral Q4H PRN Kennedy Bucker, MD   2 tablet at 03/26/20 0929  . magnesium citrate solution 1 Bottle  1 Bottle Oral Once PRN Kennedy Bucker, MD      . magnesium hydroxide (MILK OF MAGNESIA) suspension 30 mL  30 mL Oral Daily PRN Kennedy Bucker, MD   30 mL at 03/26/20 0929  . menthol-cetylpyridinium (CEPACOL) lozenge 3 mg  1 lozenge Oral PRN Kennedy Bucker, MD       Or  . phenol (CHLORASEPTIC) mouth spray 1 spray  1  spray Mouth/Throat PRN Kennedy Bucker, MD      . methocarbamol (ROBAXIN) tablet 500 mg  500 mg Oral Q6H PRN Kennedy Bucker, MD   500 mg at 03/24/20 1612   Or  . methocarbamol (ROBAXIN) 500 mg in dextrose 5 % 50 mL IVPB  500 mg Intravenous Q6H PRN Kennedy Bucker, MD      . metoCLOPramide (REGLAN) tablet 5-10 mg  5-10 mg Oral Q8H PRN Kennedy Bucker, MD       Or  . metoCLOPramide (REGLAN) injection 5-10 mg  5-10 mg Intravenous Q8H PRN Kennedy Bucker, MD      . morphine 2 MG/ML injection 0.5-1 mg  0.5-1 mg Intravenous Q2H PRN Kennedy Bucker, MD      . multivitamin-lutein (OCUVITE-LUTEIN) capsule 1 capsule  1 capsule Oral Daily Kennedy Bucker, MD   1 capsule at 03/26/20 0929  . ondansetron (ZOFRAN) tablet 4 mg  4 mg Oral Q6H PRN Kennedy Bucker, MD       Or  . ondansetron [facility]) injection 4 mg  4 mg Intravenous Q6H PRN Kennedy Bucker, MD      . traMADol Janean Sark) tablet 50 mg  50 mg Oral Q6H Kennedy Bucker, MD   50 mg at 03/26/20 1155     Discharge Medications: Please see discharge summary for a list of discharge medications.  Relevant Imaging Results:  Relevant Lab Results:   Additional Information SS# [id number]  Trenton Founds, RN

## 2020-03-26 NOTE — Progress Notes (Signed)
PROGRESS NOTE    Faith Vasquez  TOI:[id number] DOB: September 06, 1922 DOA: 03/24/2020 PCP: Patient, No Pcp Per    Assessment & Plan:   Principal Problem:   Left displaced femoral neck fracture (HCC) Active Problems:   GERD (gastroesophageal reflux disease)    Faith Vasquez is a 86 y.o. female with no significant past medical history who was brought into the emergency room after a fall at home.    Left displaced femoral neck fracture S/p left hip ARTHROPLASTY on 03/25/20 --pain control per ortho --PT/OT --Weight-Bearing as tolerated to left leg --Foley out this morning, need to monitor for retention.  GERD Continue Pepcid   DVT prophylaxis: Lovenox SQ Code Status: Full code  Family Communication: son updated at bedside today Status is: inpatient Dispo:   The patient is from: home Anticipated d/c is to: SNF Anticipated d/c date is: whenever bed available Patient currently is medically stable to d/c.   Subjective and Interval History:  Foley out, and per nursing, pt hadn't voided all morning.  Post-void about 300 ml.  No BM yet.  Pain controlled.   Objective: Vitals:   03/26/20 0745 03/26/20 1212 03/26/20 1500 03/26/20 1549  BP: 134/66 135/69  129/75  Pulse: 76 77  73  Resp: 18 18  18   Temp: 97.8 F (36.6 C) 98.3 F (36.8 C)  97.9 F (36.6 C)  TempSrc: Oral Oral  Oral  SpO2: 93% 96% 95% 94%  Weight:      Height:        Intake/Output Summary (Last 24 hours) at 03/26/2020 1609 Last data filed at 03/26/2020 1345 Gross per 24 hour  Intake 1310 ml  Output 650 ml  Net 660 ml   Filed Weights   03/24/20 1057  Weight: 59 kg    Examination:   Constitutional: NAD, alert, seemed confused, sitting up in chair HEENT: conjunctivae and lids normal, EOMI CV: No cyanosis.   RESP: normal respiratory effort, on RA Extremities: No effusions, edema in BLE SKIN: warm, dry and intact Neuro: II - XII grossly intact.     Data Reviewed: I have personally reviewed  following labs and imaging studies  CBC: Recent Labs  Lab 03/24/20 1058 03/25/20 0311 03/26/20 0406  WBC 6.9 7.6 7.3  HGB 14.6 13.3 12.3  HCT 44.5 39.8 37.6  MCV 85.6 85.4 86.0  PLT 143* 134* 114*   Basic Metabolic Panel: Recent Labs  Lab 03/24/20 1058 03/25/20 0311 03/26/20 0406  NA 140 139 139  K 4.1 3.7 4.0  CL 105 105 107  CO2 24 25 26   GLUCOSE 236* 115* 105*  BUN 28* 30* 28*  CREATININE 1.44* 1.28* 1.05*  CALCIUM 9.8 9.5 8.6*  MG  --   --  2.3   GFR: Estimated Creatinine Clearance: 29.5 mL/min (A) (by C-G formula based on SCr of 1.05 mg/dL (H)). Liver Function Tests: No results for input(s): AST, ALT, ALKPHOS, BILITOT, PROT, ALBUMIN in the last 168 hours. No results for input(s): LIPASE, AMYLASE in the last 168 hours. No results for input(s): AMMONIA in the last 168 hours. Coagulation Profile: No results for input(s): INR, PROTIME in the last 168 hours. Cardiac Enzymes: No results for input(s): CKTOTAL, CKMB, CKMBINDEX, TROPONINI in the last 168 hours. BNP (last 3 results) No results for input(s): PROBNP in the last 8760 hours. HbA1C: No results for input(s): HGBA1C in the last 72 hours. CBG: Recent Labs  Lab 03/26/20 0744  GLUCAP 92   Lipid Profile: No results for input(s): CHOL, HDL,  LDLCALC, TRIG, CHOLHDL, LDLDIRECT in the last 72 hours. Thyroid Function Tests: No results for input(s): TSH, T4TOTAL, FREET4, T3FREE, THYROIDAB in the last 72 hours. Anemia Panel: No results for input(s): VITAMINB12, FOLATE, FERRITIN, TIBC, IRON, RETICCTPCT in the last 72 hours. Sepsis Labs: No results for input(s): PROCALCITON, LATICACIDVEN in the last 168 hours.  Recent Results (from the past 240 hour(s))  Respiratory Panel by RT PCR (Flu A&B, Covid) - Nasopharyngeal Swab     Status: None   Collection Time: 03/24/20 12:35 PM   Specimen: Nasopharyngeal Swab  Result Value Ref Range Status   SARS Coronavirus 2 by RT PCR NEGATIVE NEGATIVE Final    Comment:  (NOTE) SARS-CoV-2 target nucleic acids are NOT DETECTED.  The SARS-CoV-2 RNA is generally detectable in upper respiratoy specimens during the acute phase of infection. The lowest concentration of SARS-CoV-2 viral copies this assay can detect is 131 copies/mL. A negative result does not preclude SARS-Cov-2 infection and should not be used as the sole basis for treatment or other patient management decisions. A negative result may occur with  improper specimen collection/handling, submission of specimen other than nasopharyngeal swab, presence of viral mutation(s) within the areas targeted by this assay, and inadequate number of viral copies (<131 copies/mL). A negative result must be combined with clinical observations, patient history, and epidemiological information. The expected result is Negative.  Fact Sheet for Patients:  https://www.moore.com/  Fact Sheet for Healthcare Providers:  https://www.young.biz/  This test is no t yet approved or cleared by the [place] FDA and  has been authorized for detection and/or diagnosis of SARS-CoV-2 by FDA under an Emergency Use Authorization (EUA). This EUA will remain  in effect (meaning this test can be used) for the duration of the COVID-19 declaration under Section 564(b)(1) of the Act, 21 U.S.C. section 360bbb-3(b)(1), unless the authorization is terminated or revoked sooner.     Influenza A by PCR NEGATIVE NEGATIVE Final   Influenza B by PCR NEGATIVE NEGATIVE Final    Comment: (NOTE) The Xpert Xpress SARS-CoV-2/FLU/RSV assay is intended as an aid in  the diagnosis of influenza from Nasopharyngeal swab specimens and  should not be used as a sole basis for treatment. Nasal washings and  aspirates are unacceptable for Xpert Xpress SARS-CoV-2/FLU/RSV  testing.  Fact Sheet for Patients: https://www.moore.com/  Fact Sheet for Healthcare  Providers: https://www.young.biz/  This test is not yet approved or cleared by the [place] FDA and  has been authorized for detection and/or diagnosis of SARS-CoV-2 by  FDA under an Emergency Use Authorization (EUA). This EUA will remain  in effect (meaning this test can be used) for the duration of the  Covid-19 declaration under Section 564(b)(1) of the Act, 21  U.S.C. section 360bbb-3(b)(1), unless the authorization is  terminated or revoked. Performed at [facility], [address] [place], [place] [address]       Radiology Studies: DG HIP OPERATIVE UNILAT WITH PELVIS LEFT  Result Date: 03/25/2020 CLINICAL DATA:  Operative imaging for left hip arthroplasty. EXAM: OPERATIVE LEFT HIP (WITH PELVIS IF PERFORMED) 5 VIEWS TECHNIQUE: Fluoroscopic spot image(s) were submitted for interpretation post-operatively. COMPARISON:  03/24/2020 FINDINGS: Portable images show placement of a left hip arthroplasty with the components well seated and aligned. IMPRESSION: Well-positioned left hip arthroplasty. Electronically Signed   By: Amie Portland M.D.   On: 03/25/2020 10:29   DG HIP UNILAT W OR W/O PELVIS 2-3 VIEWS LEFT  Result Date: 03/25/2020 CLINICAL DATA:  Postop pain EXAM: DG  HIP (WITH OR WITHOUT PELVIS) 2-3V LEFT COMPARISON:  None. FINDINGS: Patient is status post left hip replacement. Hardware is in good position. No evidence of failure. Soft tissue gas in the left is consistent with recent surgery. No other acute abnormalities. IMPRESSION: Patient is status post left hip replacement. Postop changes are noted. Electronically Signed   By: Gerome Sam III M.D   On: 03/25/2020 10:50     Scheduled Meds: . acidophilus  1 capsule Oral Daily  . aspirin  81 mg Oral Daily  . atorvastatin  20 mg Oral QHS  . Chlorhexidine Gluconate Cloth  6 each Topical Daily  . docusate sodium  100 mg Oral BID  . enoxaparin (LOVENOX) injection  30 mg Subcutaneous Q24H  .  famotidine  40 mg Oral BID  . feeding supplement  237 mL Oral BID BM  . multivitamin-lutein  1 capsule Oral Daily  . polyethylene glycol  17 g Oral BID  . traMADol  50 mg Oral Q6H   Continuous Infusions: . methocarbamol (ROBAXIN) IV       LOS: 2 days     Darlin Priestly, MD [facility] If 7PM-7AM, please contact night-coverage 03/26/2020, 4:09 PM

## 2020-03-26 NOTE — Evaluation (Addendum)
Physical Therapy Evaluation Patient Details Name: Faith Vasquez MRN: [id number] DOB: 27-Feb-1923 Today's Date: 03/26/2020   History of Present Illness  Pt is a 86 y/o F who recently moved to [place] from [place], [place] with anticipation of moving into [facility]. Pt admitted on 03/24/2020 after a fall at home. X ray revealed an impacted transcervical L femoral neck fx. Pt underwent L hemiarthroplasty on 03/25/20. PMH: diverticulosis. Pt's family reports recent hx of UTI & "stroke"  Clinical Impression  Pt pleasant & agreeable to tx. Pt limited by pain at rest that increases with mobility & requires significant +2 assist for bed mobility. Pt tolerates sitting EOB ~10 minutes with as little as close supervision with BUE/BLE support as activity progresses. Transfers declined 2/2 significant pain. Pt performs the following LLE strengthening exercises with instructional cuing for technique: LAQ, quad sets, ankle pumps, heel slides (AAROM), hip abduction (AAROM), and short arc quads (AAROM). Pt would benefit from ongoing acute PT services to progress bed mobility & transfers & gait as able. Recommending SNF level of care upon d/c.   Addendum: SpO2 = 91-93% on supplemental oxygen via nasal cannula during session.    Follow Up Recommendations SNF;Supervision/Assistance - 24 hour    Equipment Recommendations   (TBD in next venue)    Recommendations for Other Services       Precautions / Restrictions Precautions Precautions: Fall Precaution Comments: L anterior hip (no precautions) Restrictions Weight Bearing Restrictions: Yes LLE Weight Bearing: Weight bearing as tolerated      Mobility  Bed Mobility Overal bed mobility: Needs Assistance Bed Mobility: Supine to Sit;Sit to Supine     Supine to sit: Max assist;HOB elevated;+2 for physical assistance Sit to supine: Max assist;+2 for physical assistance;HOB elevated   General bed mobility comments: +2 assist for scooting to [facility]     Transfers                    Ambulation/Gait                Stairs            Wheelchair Mobility    Modified Rankin (Stroke Patients Only)       Balance Overall balance assessment: Needs assistance Sitting-balance support: Feet supported;Bilateral upper extremity supported Sitting balance-Leahy Scale: Poor                                       Pertinent Vitals/Pain Pain Assessment: 0-10 Pain Score: 7  Pain Location: L hip/groin Pain Descriptors / Indicators: Grimacing;Guarding;Sore;Sharp Pain Intervention(s): Limited activity within patient's tolerance;Repositioned (family requests pt receive pain medication after session as it makes her drowsy)    Home Living Family/patient expects to be discharged to:: Skilled nursing facility                 Additional Comments: family hoping for STR then transition to ALF    Prior Function Level of Independence: Independent         Comments: until 5 weeks ago when pt had UTI & "stroke" per family     Hand Dominance        Extremity/Trunk Assessment   Upper Extremity Assessment Upper Extremity Assessment: Defer to OT evaluation    Lower Extremity Assessment Lower Extremity Assessment: Generalized weakness;LLE deficits/detail LLE Deficits / Details: unable to perform LLE LAQ through full ROM 2/2 weakness & pain LLE: Unable  to fully assess due to pain    Cervical / Trunk Assessment Cervical / Trunk Assessment: Kyphotic  Communication   Communication: HOH  Cognition Arousal/Alertness: Awake/alert Behavior During Therapy: WFL for tasks assessed/performed Overall Cognitive Status: Within Functional Limits for tasks assessed                                 General Comments: family notes they were trying to limit how many moves (home>ALF) pt made as this helps with her confusion      General Comments      Exercises     Assessment/Plan    PT  Assessment Patient needs continued PT services  PT Problem List Decreased strength;Decreased balance;Decreased knowledge of precautions;Pain;Cardiopulmonary status limiting activity;Decreased knowledge of use of DME;Decreased mobility;Decreased range of motion;Decreased activity tolerance;Decreased safety awareness;Decreased coordination       PT Treatment Interventions DME instruction;Functional mobility training;Balance training;Neuromuscular re-education;Patient/family education;Gait training;Therapeutic activities;Stair training;Therapeutic exercise;Manual techniques    PT Goals (Current goals can be found in the Care Plan section)  Acute Rehab PT Goals Patient Stated Goal: decreased pain PT Goal Formulation: With patient Time For Goal Achievement: 05/07/20 Potential to Achieve Goals: Fair    Frequency BID   Barriers to discharge Decreased caregiver support;Inaccessible home environment 3 STE home    Co-evaluation               AM-PAC PT "6 Clicks" Mobility  Outcome Measure Help needed turning from your back to your side while in a flat bed without using bedrails?: A Lot Help needed moving from lying on your back to sitting on the side of a flat bed without using bedrails?: A Lot Help needed moving to and from a bed to a chair (including a wheelchair)?: Total Help needed standing up from a chair using your arms (e.g., wheelchair or bedside chair)?: Total Help needed to walk in hospital room?: Total Help needed climbing 3-5 steps with a railing? : Total 6 Click Score: 8    End of Session Equipment Utilized During Treatment: Oxygen Activity Tolerance: Patient limited by pain Patient left: in bed;with bed alarm set;with call bell/phone within reach;with nursing/sitter in room;with family/visitor present;with SCD's reapplied Nurse Communication: Patient requests pain meds PT Visit Diagnosis: Other abnormalities of gait and mobility (R26.89);Muscle weakness (generalized)  (M62.81);Difficulty in walking, not elsewhere classified (R26.2)    Time: 7425-9563 PT Time Calculation (min) (ACUTE ONLY): 43 min   Charges:   PT Evaluation $PT Eval Low Complexity: 1 Low PT Treatments $Therapeutic Exercise: 8-22 mins $Therapeutic Activity: 8-22 mins        Aleda Grana, PT, DPT 03/26/20, 10:35 AM   Sandi Mariscal 03/26/2020, 10:27 AM

## 2020-03-26 NOTE — Progress Notes (Signed)
Pt has bladder scan result of 412. Tried In and Out catheterization 2x but failed. Informed MD and incoming Night RN Viviann Spare.

## 2020-03-26 NOTE — Progress Notes (Signed)
Foley cath removed at this time. Patient tolerated well.

## 2020-03-26 NOTE — Anesthesia Postprocedure Evaluation (Signed)
Anesthesia Post Note  Patient: Faith Vasquez  Procedure(s) Performed: ARTHROPLASTY BIPOLAR HIP (HEMIARTHROPLASTY) (Left Hip)  Patient location during evaluation: Nursing Unit Anesthesia Type: Spinal Level of consciousness: oriented and awake and alert Pain management: pain level controlled Vital Signs Assessment: post-procedure vital signs reviewed and stable Respiratory status: spontaneous breathing and respiratory function stable Cardiovascular status: blood pressure returned to baseline and stable Postop Assessment: no headache, no backache, no apparent nausea or vomiting and patient able to bend at knees Anesthetic complications: no   No complications documented.   Last Vitals:  Vitals:   03/26/20 0010 03/26/20 0426  BP: 121/69 (!) 141/74  Pulse: 79 78  Resp: 16 20  Temp: 36.8 C 36.8 C  SpO2: 91% 93%    Last Pain:  Vitals:   03/26/20 0426  TempSrc: Oral  PainSc:                  Starling Manns

## 2020-03-27 LAB — BASIC METABOLIC PANEL
Anion gap: 5 (ref 5–15)
BUN: 24 mg/dL — ABNORMAL HIGH (ref 8–23)
CO2: 28 mmol/L (ref 22–32)
Calcium: 9.2 mg/dL (ref 8.9–10.3)
Chloride: 106 mmol/L (ref 98–111)
Creatinine, Ser: 1.07 mg/dL — ABNORMAL HIGH (ref 0.44–1.00)
GFR, Estimated: 48 mL/min — ABNORMAL LOW (ref >60)
Glucose, Bld: 99 mg/dL (ref 70–99)
Potassium: 4 mmol/L (ref 3.5–5.1)
Sodium: 139 mmol/L (ref 135–145)

## 2020-03-27 LAB — CBC
HCT: 35.7 % — ABNORMAL LOW (ref 36.0–46.0)
Hemoglobin: 11.8 g/dL — ABNORMAL LOW (ref 12.0–15.0)
MCH: 28.6 pg (ref 26.0–34.0)
MCHC: 33.1 g/dL (ref 30.0–36.0)
MCV: 86.7 fL (ref 80.0–100.0)
Platelets: 112 10*3/uL — ABNORMAL LOW (ref 150–400)
RBC: 4.12 MIL/uL (ref 3.87–5.11)
RDW: 14.6 % (ref 11.5–15.5)
WBC: 6.2 10*3/uL (ref 4.0–10.5)
nRBC: 0 % (ref 0.0–0.2)

## 2020-03-27 LAB — MAGNESIUM: Magnesium: 2.3 mg/dL (ref 1.7–2.4)

## 2020-03-27 LAB — SURGICAL PATHOLOGY

## 2020-03-27 MED ORDER — ACETAMINOPHEN 325 MG PO TABS: 650.0000 mg | ORAL_TABLET | Freq: Four times a day (QID) | ORAL | Status: DC

## 2020-03-27 MED ORDER — POLYETHYLENE GLYCOL 3350 17 G PO PACK: 17.0000 g | PACK | Freq: Two times a day (BID) | ORAL | 0 refills | Status: AC

## 2020-03-27 MED ORDER — ACETAMINOPHEN 325 MG PO TABS
650.0000 mg | ORAL_TABLET | Freq: Four times a day (QID) | ORAL | Status: DC
  Administered 2020-03-27 (×2): 650 mg via ORAL
  Filled 2020-03-27: qty 2

## 2020-03-27 MED ORDER — TRAMADOL HCL 50 MG PO TABS: 50.0000 mg | ORAL_TABLET | Freq: Four times a day (QID) | ORAL | 0 refills | Status: DC | PRN

## 2020-03-27 MED ORDER — ENOXAPARIN SODIUM 30 MG/0.3ML ~~LOC~~ SOLN: 30.0000 mg | SUBCUTANEOUS | 0 refills | Status: DC

## 2020-03-27 MED ORDER — TRAMADOL HCL 50 MG PO TABS: 50.0000 mg | ORAL_TABLET | Freq: Four times a day (QID) | ORAL | Status: DC | PRN

## 2020-03-27 MED ORDER — ENSURE ENLIVE PO LIQD: 237.0000 mL | Freq: Two times a day (BID) | ORAL | 12 refills | Status: AC

## 2020-03-27 MED ORDER — FLEET ENEMA 7-19 GM/118ML RE ENEM: 1.0000 | ENEMA | Freq: Once | RECTAL | Status: DC

## 2020-03-27 NOTE — Care Management Important Message (Signed)
Important Message  Patient Details  Name: Faith Vasquez MRN: [id number] Date of Birth: 03/16/1923   Medicare Important Message Given:  Yes     Olegario Messier A [name] 03/27/2020, 10:27 AM

## 2020-03-27 NOTE — Progress Notes (Signed)
[facility] called for transportation

## 2020-03-27 NOTE — Discharge Summary (Addendum)
Physician Discharge Summary   Faith Vasquez  female DOB: 1923-02-17  HCW:[id number]  PCP: Patient, No Pcp Per  Admit date: 03/24/2020 Discharge date: 03/27/2020  Admitted From: home Disposition:  SNF CODE STATUS: Full code  Discharge Instructions    Discharge instructions   Complete by: As directed    Follow-up with [facility] orthopedics in 2 weeks  Please remove provena negative pressure dressing on 04/04/2020 and apply honey comb dressing. Keep dressing clean and dry at all times.  TED hose bilateral lower extremity x6 weeks, okay to remove at nighttime.  Lovenox 30 mg subcu daily x14 days at discharge - -   Discharge wound care:   Complete by: As directed    Please remove provena negative pressure dressing on 04/04/2020 and apply honey comb dressing. Keep dressing clean and dry at all times.       Hospital Course:  For full details, please see H&P, progress notes, consult notes and ancillary notes.  Briefly,  Faith Wellfordis a 86 y.o.female withno significant pastmedical historywhowas brought into the emergency room after a fall at home.   Left displaced femoral neck fracture S/p left hip ARTHROPLASTY on 03/25/20 Pt tolerated the surgery well.  Pain well controlled on minimum amount of pain meds.  Per ortho,  Follow-up with [facility] orthopedics in 2 weeks. Please remove provena negative pressure dressing on 04/04/2020 and apply honey comb dressing. Keep dressing clean and dry at all times. TED hose bilateral lower extremity x6 weeks, okay to remove at nighttime. Lovenox 30 mg subcu daily x14 days at discharge.  Weight-Bearing as tolerated to left leg.  Acute urinary retention No prior hx of urinary retention.  Foley was removed early morning of 11/1, however, pt couldn't void despite multiple attempts, so finally Foley re-inserted night of 11/1 with 700 ml removed.  Pt is discharged with Foley, and should have a voiding trail a week after discharge (or sooner,  at the discretion of the facility attending doctor).  Hypoxic respiratory failure, POA, unknown chronicity  On presentation, pt was noted to be sating 90% on room air, so 2L [place] applied.  Nursing noted sat between 88-92% on room air.  CXR clear on presentation.  No complaints of dyspnea.  No wheezing.  No hx of COPD or asthma.  Pt was encouraged to take deep breaths which was effective in raising her O2 saturation.  Pt was discharged on 2L [place].    Discharge Diagnoses:  Principal Problem:   Left displaced femoral neck fracture (HCC) Active Problems:   GERD (gastroesophageal reflux disease)    Discharge Instructions:  Allergies as of 03/27/2020      Reactions   Codeine    Iodine    Ciprofloxacin Rash   Other reaction(s): whelps      Medication List    STOP taking these medications   acidophilus Caps capsule   famotidine 40 MG tablet Commonly known as: PEPCID   multivitamin-lutein Caps capsule     TAKE these medications   acetaminophen 325 MG tablet Commonly known as: TYLENOL Take 2 tablets (650 mg total) by mouth every 6 (six) hours.   aspirin 81 MG chewable tablet Chew 81 mg by mouth daily.   atorvastatin 20 MG tablet Commonly known as: LIPITOR Take 20 mg by mouth at bedtime.   enoxaparin 30 MG/0.3ML injection Commonly known as: LOVENOX Inject 0.3 mLs (30 mg total) into the skin daily for 14 days. Start taking on: March 28, 2020   feeding supplement Liqd Take  237 mLs by mouth 2 (two) times daily between meals.   polyethylene glycol 17 g packet Commonly known as: MIRALAX / GLYCOLAX Take 17 g by mouth 2 (two) times daily.   traMADol 50 MG tablet Commonly known as: ULTRAM Take 1 tablet (50 mg total) by mouth every 6 (six) hours as needed for moderate pain or severe pain.   Vitamin D3 1.25 MG (50000 UT) Caps Take 1 capsule by mouth once a week. Wednesday            Discharge Care Instructions  (From admission, onward)         Start     Ordered    03/27/20 0000  Discharge wound care:       Comments: Please remove provena negative pressure dressing on 04/04/2020 and apply honey comb dressing. Keep dressing clean and dry at all times.   03/27/20 1059           Contact information for follow-up providers    Evon Slack, PA-C Follow up in 2 week(s).   Specialties: Orthopedic Surgery, Emergency Medicine Contact information: [address] [place] [address] [phone]            Contact information for after-discharge care    Destination    [facility] Preferred SNF .   Service: Skilled Nursing Contact information: [address] [place] [address] [phone]                  Allergies  Allergen Reactions  . Codeine   . Iodine   . Ciprofloxacin Rash    Other reaction(s): whelps     The results of significant diagnostics from this hospitalization (including imaging, microbiology, ancillary and laboratory) are listed below for reference.   Consultations:   Procedures/Studies: DG Chest 1 View  Result Date: 03/24/2020 CLINICAL DATA:  Mechanical fall this morning with left hip pain. EXAM: CHEST  1 VIEW COMPARISON:  None. FINDINGS: The heart size is normal. Vascular calcifications are seen in the aortic arch. Both lungs are clear. The visualized skeletal structures are unremarkable. IMPRESSION: No active disease. Electronically Signed   By: Romona Curls M.D.   On: 03/24/2020 12:48   CT Head Wo Contrast  Result Date: 03/24/2020 CLINICAL DATA:  Mechanical fall with head trauma and pain. EXAM: CT HEAD WITHOUT CONTRAST TECHNIQUE: Contiguous axial images were obtained from the base of the skull through the vertex without intravenous contrast. COMPARISON:  None. FINDINGS: Brain: No evidence of acute infarction, hemorrhage, hydrocephalus, extra-axial collection or mass lesion/mass effect. A chronic lacunar infarct is seen in the genu of the left internal capsule. There  is mild cerebral volume loss with associated ex vacuo dilatation. Periventricular white matter hypoattenuation likely represents chronic small vessel ischemic disease. Vascular: There are vascular calcifications in the carotid siphons. Skull: Normal. Negative for fracture or focal lesion. Sinuses/Orbits: No acute finding. Other: None. IMPRESSION: 1. No acute intracranial process. Electronically Signed   By: Romona Curls M.D.   On: 03/24/2020 12:14   DG HIP OPERATIVE UNILAT WITH PELVIS LEFT  Result Date: 03/25/2020 CLINICAL DATA:  Operative imaging for left hip arthroplasty. EXAM: OPERATIVE LEFT HIP (WITH PELVIS IF PERFORMED) 5 VIEWS TECHNIQUE: Fluoroscopic spot image(s) were submitted for interpretation post-operatively. COMPARISON:  03/24/2020 FINDINGS: Portable images show placement of a left hip arthroplasty with the components well seated and aligned. IMPRESSION: Well-positioned left hip arthroplasty. Electronically Signed   By: Amie Portland M.D.   On: 03/25/2020 10:29  DG HIP UNILAT W OR W/O PELVIS 2-3 VIEWS LEFT  Result Date: 03/25/2020 CLINICAL DATA:  Postop pain EXAM: DG HIP (WITH OR WITHOUT PELVIS) 2-3V LEFT COMPARISON:  None. FINDINGS: Patient is status post left hip replacement. Hardware is in good position. No evidence of failure. Soft tissue gas in the left is consistent with recent surgery. No other acute abnormalities. IMPRESSION: Patient is status post left hip replacement. Postop changes are noted. Electronically Signed   By: Gerome Sam III M.D   On: 03/25/2020 10:50   DG HIP UNILAT WITH PELVIS 2-3 VIEWS LEFT  Result Date: 03/24/2020 CLINICAL DATA:  Left hip pain EXAM: DG HIP (WITH OR WITHOUT PELVIS) 2-3V LEFT COMPARISON:  None. FINDINGS: There is an impacted transcervical left femoral neck fracture. The humeral head articulates with the acetabulum and there is no hip dislocation. Mild degenerative changes are seen in both hips. Stool overlies the rectum. Degenerative changes  are seen in the spine. IMPRESSION: Impacted transcervical left femoral neck fracture. Electronically Signed   By: Romona Curls M.D.   On: 03/24/2020 12:38      Labs: BNP (last 3 results) No results for input(s): BNP in the last 8760 hours. Basic Metabolic Panel: Recent Labs  Lab 03/24/20 1058 03/25/20 0311 03/26/20 0406 03/27/20 0355  NA 140 139 139 139  K 4.1 3.7 4.0 4.0  CL 105 105 107 106  CO2 24 25 26 28   GLUCOSE 236* 115* 105* 99  BUN 28* 30* 28* 24*  CREATININE 1.44* 1.28* 1.05* 1.07*  CALCIUM 9.8 9.5 8.6* 9.2  MG  --   --  2.3 2.3   Liver Function Tests: No results for input(s): AST, ALT, ALKPHOS, BILITOT, PROT, ALBUMIN in the last 168 hours. No results for input(s): LIPASE, AMYLASE in the last 168 hours. No results for input(s): AMMONIA in the last 168 hours. CBC: Recent Labs  Lab 03/24/20 1058 03/25/20 0311 03/26/20 0406 03/27/20 0355  WBC 6.9 7.6 7.3 6.2  HGB 14.6 13.3 12.3 11.8*  HCT 44.5 39.8 37.6 35.7*  MCV 85.6 85.4 86.0 86.7  PLT 143* 134* 114* 112*   Cardiac Enzymes: No results for input(s): CKTOTAL, CKMB, CKMBINDEX, TROPONINI in the last 168 hours. BNP: Invalid input(s): POCBNP CBG: Recent Labs  Lab 03/26/20 0744  GLUCAP 92   D-Dimer No results for input(s): DDIMER in the last 72 hours. Hgb A1c No results for input(s): HGBA1C in the last 72 hours. Lipid Profile No results for input(s): CHOL, HDL, LDLCALC, TRIG, CHOLHDL, LDLDIRECT in the last 72 hours. Thyroid function studies No results for input(s): TSH, T4TOTAL, T3FREE, THYROIDAB in the last 72 hours.  Invalid input(s): FREET3 Anemia work up No results for input(s): VITAMINB12, FOLATE, FERRITIN, TIBC, IRON, RETICCTPCT in the last 72 hours. Urinalysis    Component Value Date/Time   COLORURINE COLORLESS (A) 11/11/2017 0215   APPEARANCEUR CLEAR (A) 11/11/2017 0215   LABSPEC 1.002 (L) 11/11/2017 0215   PHURINE 8.0 11/11/2017 0215   GLUCOSEU NEGATIVE 11/11/2017 0215   HGBUR  NEGATIVE 11/11/2017 0215   BILIRUBINUR NEGATIVE 11/11/2017 0215   KETONESUR NEGATIVE 11/11/2017 0215   PROTEINUR NEGATIVE 11/11/2017 0215   NITRITE NEGATIVE 11/11/2017 0215   LEUKOCYTESUR NEGATIVE 11/11/2017 0215   Sepsis Labs Invalid input(s): PROCALCITONIN,  WBC,  LACTICIDVEN Microbiology Recent Results (from the past 240 hour(s))  Respiratory Panel by RT PCR (Flu A&B, Covid) - Nasopharyngeal Swab     Status: None   Collection Time: 03/24/20 12:35 PM   Specimen: Nasopharyngeal Swab  Result Value Ref Range Status   SARS Coronavirus 2 by RT PCR NEGATIVE NEGATIVE Final    Comment: (NOTE) SARS-CoV-2 target nucleic acids are NOT DETECTED.  The SARS-CoV-2 RNA is generally detectable in upper respiratoy specimens during the acute phase of infection. The lowest concentration of SARS-CoV-2 viral copies this assay can detect is 131 copies/mL. A negative result does not preclude SARS-Cov-2 infection and should not be used as the sole basis for treatment or other patient management decisions. A negative result may occur with  improper specimen collection/handling, submission of specimen other than nasopharyngeal swab, presence of viral mutation(s) within the areas targeted by this assay, and inadequate number of viral copies (<131 copies/mL). A negative result must be combined with clinical observations, patient history, and epidemiological information. The expected result is Negative.  Fact Sheet for Patients:  https://www.moore.com/https://www.fda.gov/media/142436/download  Fact Sheet for Healthcare Providers:  https://www.young.biz/https://www.fda.gov/media/142435/download  This test is no t yet approved or cleared by the [place] FDA and  has been authorized for detection and/or diagnosis of SARS-CoV-2 by FDA under an Emergency Use Authorization (EUA). This EUA will remain  in effect (meaning this test can be used) for the duration of the COVID-19 declaration under Section 564(b)(1) of the Act, 21 U.S.C. section  360bbb-3(b)(1), unless the authorization is terminated or revoked sooner.     Influenza A by PCR NEGATIVE NEGATIVE Final   Influenza B by PCR NEGATIVE NEGATIVE Final    Comment: (NOTE) The Xpert Xpress SARS-CoV-2/FLU/RSV assay is intended as an aid in  the diagnosis of influenza from Nasopharyngeal swab specimens and  should not be used as a sole basis for treatment. Nasal washings and  aspirates are unacceptable for Xpert Xpress SARS-CoV-2/FLU/RSV  testing.  Fact Sheet for Patients: https://www.moore.com/https://www.fda.gov/media/142436/download  Fact Sheet for Healthcare Providers: https://www.young.biz/https://www.fda.gov/media/142435/download  This test is not yet approved or cleared by the [place] FDA and  has been authorized for detection and/or diagnosis of SARS-CoV-2 by  FDA under an Emergency Use Authorization (EUA). This EUA will remain  in effect (meaning this test can be used) for the duration of the  Covid-19 declaration under Section 564(b)(1) of the Act, 21  U.S.C. section 360bbb-3(b)(1), unless the authorization is  terminated or revoked. Performed at [facility], [address] [place], [place] [address]      Total time spend on discharging this patient, including the last patient exam, discussing the hospital stay, instructions for ongoing care as it relates to all pertinent caregivers, as well as preparing the medical discharge records, prescriptions, and/or referrals as applicable, is 45 minutes.    Darlin Priestlyina Alvey Brockel, MD  [facility] 03/27/2020, 11:01 AM  If 7PM-7AM, please contact night-coverage

## 2020-03-27 NOTE — Progress Notes (Signed)
Physical Therapy Treatment Patient Details Name: Faith Vasquez MRN: [id number] DOB: 1922/09/30 Today's Date: 03/27/2020    History of Present Illness 86 y/o F who recently moved to [place] from [place], [place] with anticipation of moving into [facility]. Pt admitted on 03/24/2020 after a fall at home. X ray revealed an impacted transcervical L femoral neck fx. Pt underwent L hemiarthroplasty on 03/25/20. PMH: diverticulosis. Pt's family reports recent hx of UTI & "stroke"    PT Comments    Pt pleasant but with some confusion t/o the session, repeated cuing to insure she did not pull on lines.  Though she needed plenty of cuing she was able to participate with bed exercises relatively well, showing AROM and even some tolerance to resisted acts.  Again good effort with mobility, showing ability to bridge and scoot hips, however unable to elevate trunk even with plenty of cuing and needed assist to attain sitting.  Pt with very slow, guarded ambulation but was ultimately able to ambulate ~10 ft, very fatigue with the effort, but even on room air her O2 remained in the 90s.    Follow Up Recommendations  SNF;Supervision/Assistance - 24 hour     Equipment Recommendations  None recommended by PT    Recommendations for Other Services       Precautions / Restrictions Precautions Precautions: Anterior Hip;Fall Restrictions Weight Bearing Restrictions: Yes LLE Weight Bearing: Weight bearing as tolerated    Mobility  Bed Mobility Overal bed mobility: Needs Assistance Bed Mobility: Supine to Sit     Supine to sit: Mod assist     General bed mobility comments: Pt was able to bridge and scoot hips, needed considerable assist to get trunk up to sitting  Transfers Overall transfer level: Needs assistance Equipment used: Rolling walker (2 wheeled) Transfers: Sit to/from [employer] Sit to Stand: Min guard Stand pivot transfers: Min guard       General transfer  comment: With cuing/set up pt was able to rise to standing with only cuing/encouragement and CGA  Ambulation/Gait Ambulation/Gait assistance: Supervision Gait Distance (Feet): 10 Feet Assistive device: Rolling walker (2 wheeled)       General Gait Details: Pt was able to take some slow but steady steps with heavy walker reliance and plenty of cuing/encouragement.  Hesitant to put a lot of weight on L and quick to fatigue but by far the most she has been able to manage since surgery   Stairs             Wheelchair Mobility    Modified Rankin (Stroke Patients Only)       Balance Overall balance assessment: Needs assistance   Sitting balance-Leahy Scale: Good Sitting balance - Comments: Pt able to maintain static sitting balance at EOB w/ supervision only     Standing balance-Leahy Scale: Fair Standing balance comment: BUE support on RW in standing                            Cognition Arousal/Alertness: Awake/alert Behavior During Therapy: Restless Overall Cognitive Status: Impaired/Different from baseline Area of Impairment: Orientation;Attention;Memory;Following commands;Safety/judgement;Awareness;Problem solving                               General Comments: Frequent reminders not to pull on lines (mitts in room, not donned on arrival)      Exercises Total Joint Exercises Ankle Circles/Pumps: AROM;10 reps  Quad Sets: Strengthening;10 reps Short Arc Quad: AROM;10 reps Heel Slides: AAROM;AROM;10 reps (with resisted leg extensions) Hip ABduction/ADduction: AROM;10 reps Straight Leg Raises: AAROM;5 reps    General Comments        Pertinent Vitals/Pain Pain Assessment: 0-10 Pain Score: 5  Pain Location: L hip/groin    Home Living                      Prior Function            PT Goals (current goals can now be found in the care plan section) Progress towards PT goals: Progressing toward goals    Frequency     BID      PT Plan Current plan remains appropriate    Co-evaluation              AM-PAC PT "6 Clicks" Mobility   Outcome Measure  Help needed turning from your back to your side while in a flat bed without using bedrails?: A Little Help needed moving from lying on your back to sitting on the side of a flat bed without using bedrails?: A Little Help needed moving to and from a bed to a chair (including a wheelchair)?: A Lot Help needed standing up from a chair using your arms (e.g., wheelchair or bedside chair)?: A Little Help needed to walk in hospital room?: A Lot Help needed climbing 3-5 steps with a railing? : A Lot 6 Click Score: 15    End of Session Equipment Utilized During Treatment: Oxygen;Gait belt Activity Tolerance: Patient tolerated treatment well;Patient limited by fatigue Patient left: with chair alarm set;with call bell/phone within reach;with family/visitor present Nurse Communication: Mobility status PT Visit Diagnosis: Other abnormalities of gait and mobility (R26.89);Muscle weakness (generalized) (M62.81);Difficulty in walking, not elsewhere classified (R26.2)     Time: 1010-1050 PT Time Calculation (min) (ACUTE ONLY): 40 min  Charges:  $Gait Training: 8-22 mins $Therapeutic Exercise: 8-22 mins $Therapeutic Activity: 8-22 mins                     Malachi Pro, DPT 03/27/2020, 11:36 AM

## 2020-03-27 NOTE — Progress Notes (Addendum)
   Subjective: 2 Days Post-Op Procedure(s) (LRB): ARTHROPLASTY BIPOLAR HIP (HEMIARTHROPLASTY) (Left) Patient reports pain as mild.  Family member reports some agitation last night Patient is well, and has had no acute complaints or problems Denies any CP, SOB, ABD pain. We will continue therapy today.   Objective: Vital signs in last 24 hours: Temp:  [97.8 F (36.6 C)-98.3 F (36.8 C)] 97.9 F (36.6 C) (11/01 1549) Pulse Rate:  [73-77] 73 (11/01 1549) Resp:  [18] 18 (11/01 1549) BP: (129-135)/(66-75) 129/75 (11/01 1549) SpO2:  [93 %-96 %] 94 % (11/01 1549)  Intake/Output from previous day: 11/01 0701 - 11/02 0700 In: 360 [P.O.:360] Out: 1785 [Urine:1350; Blood:435] Intake/Output this shift: No intake/output data recorded.  Recent Labs    03/24/20 1058 03/25/20 0311 03/26/20 0406 03/27/20 0355  HGB 14.6 13.3 12.3 11.8*   Recent Labs    03/26/20 0406 03/27/20 0355  WBC 7.3 6.2  RBC 4.37 4.12  HCT 37.6 35.7*  PLT 114* 112*   Recent Labs    03/26/20 0406 03/27/20 0355  NA 139 139  K 4.0 4.0  CL 107 106  CO2 26 28  BUN 28* 24*  CREATININE 1.05* 1.07*  GLUCOSE 105* 99  CALCIUM 8.6* 9.2   No results for input(s): LABPT, INR in the last 72 hours.  EXAM General - Patient is Alert, Appropriate and Oriented Extremity - Neurovascular intact Sensation intact distally Intact pulses distally Dorsiflexion/Plantar flexion intact No cellulitis present Compartment soft Dressing - dressing C/D/I and no drainage, Praveena intact without drainage Motor Function - intact, moving foot and toes well on exam.   Past Medical History:  Diagnosis Date  . Diverticulosis   . History of Clostridium difficile infection    uncertain dates, possibly in 2017 or 2018    Assessment/Plan:   2 Days Post-Op Procedure(s) (LRB): ARTHROPLASTY BIPOLAR HIP (HEMIARTHROPLASTY) (Left) Principal Problem:   Left displaced femoral neck fracture (HCC) Active Problems:   GERD  (gastroesophageal reflux disease)  Estimated body mass index is 21.63 kg/m as calculated from the following:   Height as of this encounter: 5\' 5"  (1.651 m).   Weight as of this encounter: 59 kg. Advance diet Up with therapy  Slight increase in baseline confusion - stop norco/methocarbamol and give tramadol only PRN. Tylenol scheduled. Pain well controlled.  Labs and vital signs are stable Care management to assist with discharge  Follow-up with [facility] orthopedics in 2 weeks Please remove provena negative pressure dressing on 04/04/2020 and apply honey comb dressing. Keep dressing clean and dry at all times. TED hose bilateral lower extremity x6 weeks, okay to remove at nighttime. Lovenox 30 mg subcu daily x14 days at discharge    DVT Prophylaxis - Lovenox, TED hose and SCDs Weight-Bearing as tolerated to left leg   T. 13/02/2020, PA-C [facility] 03/27/2020, 7:27 AM

## 2020-03-27 NOTE — Progress Notes (Signed)
[facility] here to transport pt, daughter in law at bedside.

## 2020-03-27 NOTE — Progress Notes (Signed)
Report called to receiving nurse, Kim at [facility]. AVS placed in packet. IV removed.  Foley in place, 2L, and [place] wound vac. Pt waiting on [facility] for transportation.  Arlana Hove, RN

## 2020-04-16 ENCOUNTER — Other Ambulatory Visit: Payer: Self-pay | Admitting: Orthopedic Surgery

## 2020-04-16 ENCOUNTER — Other Ambulatory Visit: Payer: Self-pay

## 2020-04-16 ENCOUNTER — Ambulatory Visit
Admission: RE | Admit: 2020-04-16 | Discharge: 2020-04-16 | Disposition: A | Discharge status: Home / Self Care | Payer: Medicare Other | Source: Ambulatory Visit | Attending: Orthopedic Surgery | Admitting: Orthopedic Surgery

## 2020-04-16 DIAGNOSIS — M79605 Pain in left leg: Secondary | ICD-10-CM | POA: Diagnosis present

## 2020-04-16 DIAGNOSIS — I82402 Acute embolism and thrombosis of unspecified deep veins of left lower extremity: Secondary | ICD-10-CM | POA: Diagnosis present

## 2020-05-10 ENCOUNTER — Other Ambulatory Visit: Payer: Self-pay

## 2020-05-10 ENCOUNTER — Ambulatory Visit (INDEPENDENT_AMBULATORY_CARE_PROVIDER_SITE_OTHER): Payer: Medicare Other | Admitting: Family Medicine

## 2020-05-10 NOTE — Progress Notes (Signed)
New patient visit   Patient: Faith Vasquez   DOB: 01/14/1923   86 y.o. Female  MRN: [id number] Visit Date: 05/10/2020  Today's healthcare provider: Dortha Kern, PA-C   No chief complaint on file.  Subjective    Faith Vasquez is a 86 y.o. female who presents today as a new patient to establish care.  HPI    Past Medical History:  Diagnosis Date   Diverticulosis    History of Clostridium difficile infection    uncertain dates, possibly in 2017 or 2018   Past Surgical History:  Procedure Laterality Date   ABDOMINAL HYSTERECTOMY     CHOLECYSTECTOMY     HIP ARTHROPLASTY Left 03/25/2020   Procedure: ARTHROPLASTY BIPOLAR HIP (HEMIARTHROPLASTY);  Surgeon: Kennedy Bucker, MD;  Location: [facility] ORS;  Service: Orthopedics;  Laterality: Left;   No family status information on file.   No family history on file. Social History   Socioeconomic History   Marital status: Widowed    Spouse name: Not on file   Number of children: Not on file   Years of education: Not on file   Highest education level: Not on file  Occupational History   Not on file  Tobacco Use   Smoking status: Never Smoker   Smokeless tobacco: Never Used  Substance and Sexual Activity   Alcohol use: Never   Drug use: Never   Sexual activity: Not on file  Other Topics Concern   Not on file  Social History Narrative   Not on file   Social Determinants of Health   Financial Resource Strain: Not on file  Food Insecurity: Not on file  Transportation Needs: Not on file  Physical Activity: Not on file  Stress: Not on file  Social Connections: Not on file   Outpatient Medications Prior to Visit  Medication Sig   acetaminophen (TYLENOL) 325 MG tablet Take 2 tablets (650 mg total) by mouth every 6 (six) hours.   aspirin 81 MG chewable tablet Chew 81 mg by mouth daily.   atorvastatin (LIPITOR) 20 MG tablet Take 20 mg by mouth at bedtime.   Cholecalciferol (VITAMIN D3) 1.25 MG (50000 UT) CAPS Take  1 capsule by mouth once a week. Wednesday   enoxaparin (LOVENOX) 30 MG/0.3ML injection Inject 0.3 mLs (30 mg total) into the skin daily for 14 days.   feeding supplement (ENSURE ENLIVE / ENSURE PLUS) LIQD Take 237 mLs by mouth 2 (two) times daily between meals.   polyethylene glycol (MIRALAX / GLYCOLAX) 17 g packet Take 17 g by mouth 2 (two) times daily.   traMADol (ULTRAM) 50 MG tablet Take 1 tablet (50 mg total) by mouth every 6 (six) hours as needed for moderate pain or severe pain.   No facility-administered medications prior to visit.   Allergies  Allergen Reactions   Codeine    Iodine    Ciprofloxacin Rash    Other reaction(s): whelps    Immunization History  Administered Date(s) Administered   Moderna Sars-Covid-2 Vaccination 04/28/2020    Health Maintenance  Topic Date Due   TETANUS/TDAP  Never done   DEXA SCAN  Never done   PNA vac Low Risk Adult (1 of 2 - PCV13) Never done   INFLUENZA VACCINE  Never done   COVID-19 Vaccine (2 - Moderna 3-dose booster series) 05/26/2020    Patient Care Team: Chrismon, Jodell Cipro, PA-C as PCP - General (Family Medicine)  Review of Systems     Objective    There were no  vitals taken for this visit. Physical Exam   Depression Screen No flowsheet data found. No results found for any visits on 05/10/20.  Assessment & Plan      Pt not seen .  No follow-ups on file.     Am the supervising physician for Aldine Contes, PA and I am signing off on this unfinished note with no further clinical information Megan Mans., MD    Dortha Kern, PA-C  [facility] [phone] (phone) [phone] (fax)  [facility]

## 2020-06-04 ENCOUNTER — Ambulatory Visit: Payer: Medicare Other | Admitting: Family Medicine

## 2020-09-04 ENCOUNTER — Emergency Department: Payer: Medicare Other

## 2020-09-04 ENCOUNTER — Other Ambulatory Visit: Payer: Self-pay

## 2020-09-04 ENCOUNTER — Encounter: Payer: Self-pay | Admitting: *Deleted

## 2020-09-04 ENCOUNTER — Emergency Department
Admission: EM | Admit: 2020-09-04 | Discharge: 2020-09-04 | Disposition: A | Discharge status: Home / Self Care | Payer: Medicare Other | Attending: Emergency Medicine | Admitting: Emergency Medicine

## 2020-09-04 DIAGNOSIS — R1031 Right lower quadrant pain: Secondary | ICD-10-CM | POA: Diagnosis not present

## 2020-09-04 DIAGNOSIS — Z7982 Long term (current) use of aspirin: Secondary | ICD-10-CM | POA: Insufficient documentation

## 2020-09-04 DIAGNOSIS — Z96642 Presence of left artificial hip joint: Secondary | ICD-10-CM | POA: Diagnosis not present

## 2020-09-04 DIAGNOSIS — K219 Gastro-esophageal reflux disease without esophagitis: Secondary | ICD-10-CM | POA: Insufficient documentation

## 2020-09-04 DIAGNOSIS — R109 Unspecified abdominal pain: Secondary | ICD-10-CM | POA: Diagnosis present

## 2020-09-04 LAB — CBC WITH DIFFERENTIAL/PLATELET
Abs Immature Granulocytes: 0.02 10*3/uL (ref 0.00–0.07)
Basophils Absolute: 0 10*3/uL (ref 0.0–0.1)
Basophils Relative: 1 %
Eosinophils Absolute: 0.2 10*3/uL (ref 0.0–0.5)
Eosinophils Relative: 4 %
HCT: 39.7 % (ref 36.0–46.0)
Hemoglobin: 12.9 g/dL (ref 12.0–15.0)
Immature Granulocytes: 0 %
Lymphocytes Relative: 18 %
Lymphs Abs: 1 10*3/uL (ref 0.7–4.0)
MCH: 27.7 pg (ref 26.0–34.0)
MCHC: 32.5 g/dL (ref 30.0–36.0)
MCV: 85.4 fL (ref 80.0–100.0)
Monocytes Absolute: 0.7 10*3/uL (ref 0.1–1.0)
Monocytes Relative: 13 %
Neutro Abs: 3.4 10*3/uL (ref 1.7–7.7)
Neutrophils Relative %: 64 %
Platelets: 143 10*3/uL — ABNORMAL LOW (ref 150–400)
RBC: 4.65 MIL/uL (ref 3.87–5.11)
RDW: 15.2 % (ref 11.5–15.5)
WBC: 5.3 10*3/uL (ref 4.0–10.5)
nRBC: 0 % (ref 0.0–0.2)

## 2020-09-04 LAB — URINALYSIS, COMPLETE (UACMP) WITH MICROSCOPIC
Bilirubin Urine: NEGATIVE
Glucose, UA: NEGATIVE mg/dL
Hgb urine dipstick: NEGATIVE
Ketones, ur: NEGATIVE mg/dL
Nitrite: NEGATIVE
Protein, ur: NEGATIVE mg/dL
Specific Gravity, Urine: 1.013 (ref 1.005–1.030)
pH: 6 (ref 5.0–8.0)

## 2020-09-04 LAB — COMPREHENSIVE METABOLIC PANEL
ALT: 11 U/L (ref 0–44)
AST: 19 U/L (ref 15–41)
Albumin: 3.6 g/dL (ref 3.5–5.0)
Alkaline Phosphatase: 92 U/L (ref 38–126)
Anion gap: 8 (ref 5–15)
BUN: 23 mg/dL (ref 8–23)
CO2: 24 mmol/L (ref 22–32)
Calcium: 9.4 mg/dL (ref 8.9–10.3)
Chloride: 108 mmol/L (ref 98–111)
Creatinine, Ser: 1.12 mg/dL — ABNORMAL HIGH (ref 0.44–1.00)
GFR, Estimated: 45 mL/min — ABNORMAL LOW (ref >60)
Glucose, Bld: 106 mg/dL — ABNORMAL HIGH (ref 70–99)
Potassium: 3.7 mmol/L (ref 3.5–5.1)
Sodium: 140 mmol/L (ref 135–145)
Total Bilirubin: 0.7 mg/dL (ref 0.3–1.2)
Total Protein: 5.8 g/dL — ABNORMAL LOW (ref 6.5–8.1)

## 2020-09-04 LAB — LACTIC ACID, PLASMA: Lactic Acid, Venous: 1.2 mmol/L (ref 0.5–1.9)

## 2020-09-04 LAB — LIPASE, BLOOD: Lipase: 39 U/L (ref 11–51)

## 2020-09-04 MED ORDER — IOHEXOL 300 MG/ML  SOLN
75.0000 mL | Freq: Once | INTRAMUSCULAR | Status: AC | PRN
  Administered 2020-09-04: 75 mL via INTRAVENOUS

## 2020-09-04 NOTE — ED Notes (Signed)
Patient transported to CT 

## 2020-09-04 NOTE — ED Provider Notes (Signed)
[facility] Emergency Department Provider Note   ____________________________________________   Event Date/Time   First MD Initiated Contact with Patient 09/04/20 2035     (approximate)  I have reviewed the triage vital signs and the nursing notes.   HISTORY  Chief Complaint Abdominal Pain    HPI Faith Vasquez is a 87 y.o. female with a stated past medical history of GERD, cholecystectomy, and appendectomy who presents for right-sided abdominal pain that began approximately 2 hours prior to arrival.  Patient describes a burning pain is 7/10, stable, nonradiating, and without any exacerbating or relieving factors.  Patient currently denies any vision changes, tinnitus, difficulty speaking, facial droop, sore throat, chest pain, shortness of breath, nausea/vomiting/diarrhea, dysuria, or weakness/numbness/paresthesias in any extremity         Past Medical History:  Diagnosis Date  . Diverticulosis   . History of Clostridium difficile infection    uncertain dates, possibly in 2017 or 2018    Patient Active Problem List   Diagnosis Date Noted  . Closed left hip fracture (HCC) 03/24/2020  . Left displaced femoral neck fracture (HCC) 03/24/2020  . GERD (gastroesophageal reflux disease) 03/24/2020    Past Surgical History:  Procedure Laterality Date  . ABDOMINAL HYSTERECTOMY    . CHOLECYSTECTOMY    . HIP ARTHROPLASTY Left 03/25/2020   Procedure: ARTHROPLASTY BIPOLAR HIP (HEMIARTHROPLASTY);  Surgeon: Kennedy Bucker, MD;  Location: [facility] ORS;  Service: Orthopedics;  Laterality: Left;    Prior to Admission medications   Medication Sig Start Date End Date Taking? Authorizing Provider  acetaminophen (TYLENOL) 325 MG tablet Take 2 tablets (650 mg total) by mouth every 6 (six) hours. 03/27/20   Evon Slack, PA-C  aspirin 81 MG chewable tablet Chew 81 mg by mouth daily.    [provider]  atorvastatin (LIPITOR) 20 MG tablet Take 20 mg by  mouth at bedtime. 03/15/20   [provider]  Cholecalciferol (VITAMIN D3) 1.25 MG (50000 UT) CAPS Take 1 capsule by mouth once a week. Wednesday 03/22/20   [provider]  enoxaparin (LOVENOX) 30 MG/0.3ML injection Inject 0.3 mLs (30 mg total) into the skin daily for 14 days. 03/28/20 04/11/20  Darlin Priestly, MD  feeding supplement (ENSURE ENLIVE / ENSURE PLUS) LIQD Take 237 mLs by mouth 2 (two) times daily between meals. 03/27/20   Darlin Priestly, MD  polyethylene glycol (MIRALAX / GLYCOLAX) 17 g packet Take 17 g by mouth 2 (two) times daily. 03/27/20   Darlin Priestly, MD  traMADol (ULTRAM) 50 MG tablet Take 1 tablet (50 mg total) by mouth every 6 (six) hours as needed for moderate pain or severe pain. 03/27/20   Evon Slack, PA-C    Allergies Codeine, Iodine, and Ciprofloxacin  No family history on file.  Social History Social History   Tobacco Use  . Smoking status: Never Smoker  . Smokeless tobacco: Never Used  Substance Use Topics  . Alcohol use: Never  . Drug use: Never    Review of Systems Constitutional: No fever/chills Eyes: No visual changes. ENT: No sore throat. Cardiovascular: Denies chest pain. Respiratory: Denies shortness of breath. Gastrointestinal: Endorses right-sided abdominal pain.  No nausea, no vomiting.  No diarrhea. Genitourinary: Negative for dysuria. Musculoskeletal: Negative for acute arthralgias Skin: Negative for rash. Neurological: Negative for headaches, weakness/numbness/paresthesias in any extremity Psychiatric: Negative for suicidal ideation/homicidal ideation   ____________________________________________   PHYSICAL EXAM:  VITAL SIGNS: ED Triage Vitals  Enc Vitals Group     BP  09/04/20 2025 (!) 147/81     Pulse Rate 09/04/20 2025 70     Resp 09/04/20 2025 20     Temp 09/04/20 2025 98.3 F (36.8 C)     Temp Source 09/04/20 2025 Oral     SpO2 09/04/20 2025 96 %     Weight 09/04/20 2023 130 lb (59 kg)     Height 09/04/20  2023 5\' 5"  (1.651 m)     Head Circumference --      Peak Flow --      Pain Score 09/04/20 2023 7     Pain Loc --      Pain Edu? --      Excl. in GC? --    Constitutional: Alert and oriented. Well appearing and in no acute distress. Eyes: Conjunctivae are normal. PERRL. Head: Atraumatic. Nose: No congestion/rhinnorhea. Mouth/Throat: Mucous membranes are moist. Neck: No stridor Cardiovascular: Grossly normal heart sounds.  Good peripheral circulation. Respiratory: Normal respiratory effort.  No retractions. Gastrointestinal: Soft and nontender. No distention. Musculoskeletal: No obvious deformities Neurologic:  Normal speech and language. No gross focal neurologic deficits are appreciated. Skin:  Skin is warm and dry. No rash noted. Psychiatric: Mood and affect are normal. Speech and behavior are normal.  ____________________________________________   LABS (all labs ordered are listed, but only abnormal results are displayed)  Labs Reviewed  COMPREHENSIVE METABOLIC PANEL - Abnormal; Notable for the following components:      Result Value   Glucose, Bld 106 (*)    Creatinine, Ser 1.12 (*)    Total Protein 5.8 (*)    GFR, Estimated 45 (*)    All other components within normal limits  CBC WITH DIFFERENTIAL/PLATELET - Abnormal; Notable for the following components:   Platelets 143 (*)    All other components within normal limits  URINALYSIS, COMPLETE (UACMP) WITH MICROSCOPIC - Abnormal; Notable for the following components:   Color, Urine YELLOW (*)    APPearance CLEAR (*)    Leukocytes,Ua LARGE (*)    Bacteria, UA RARE (*)    All other components within normal limits  LIPASE, BLOOD  LACTIC ACID, PLASMA    RADIOLOGY  ED MD interpretation: CT of the abdomen and pelvis with IV contrast shows no evidence of acute abnormalities  Official radiology report(s): CT Abdomen Pelvis W Contrast  Result Date: 09/04/2020 CLINICAL DATA:  Abdominal pain, loss of appetite EXAM: CT  ABDOMEN AND PELVIS WITH CONTRAST TECHNIQUE: Multidetector CT imaging of the abdomen and pelvis was performed using the standard protocol following bolus administration of intravenous contrast. CONTRAST:  22mL OMNIPAQUE IOHEXOL 300 MG/ML  SOLN COMPARISON:  11/11/2017 FINDINGS: Lower chest: No acute pleural or parenchymal lung disease. Hepatobiliary: No focal liver abnormality is seen. Status post cholecystectomy. No biliary dilatation. Pancreas: Unremarkable. No pancreatic ductal dilatation or surrounding inflammatory changes. Spleen: Normal in size without focal abnormality. Adrenals/Urinary Tract: Mild bilateral renal cortical atrophy. Bilateral cortical and peripelvic renal cysts. No urinary tract calculi or obstructive uropathy. The bladder is unremarkable. 9 mm right adrenal nodule unchanged, indeterminate based on attenuation characteristics but likely an adenoma given stability. Left adrenal is unremarkable. Stomach/Bowel: No bowel obstruction or ileus. There is diffuse colonic diverticulosis without diverticulitis. No bowel wall thickening or inflammatory change. Vascular/Lymphatic: Aortic atherosclerosis. No enlarged abdominal or pelvic lymph nodes. Reproductive: Status post hysterectomy. No adnexal masses. Other: No free fluid or free gas. Small fat containing umbilical hernia. No bowel herniation. Musculoskeletal: Unremarkable left hip arthroplasty. No acute or destructive bony lesions. Reconstructed  images demonstrate no additional findings. IMPRESSION: 1. Diffuse colonic diverticulosis without diverticulitis. 2. No acute intra-abdominal or intrapelvic process. 3. Bilateral cortical and peripelvic renal cysts. 4. Small fat containing umbilical hernia.  No bowel herniation. 5.  Aortic Atherosclerosis (ICD10-I70.0). Electronically Signed   By: Sharlet Salina M.D.   On: 09/04/2020 22:31    ____________________________________________   PROCEDURES  Procedure(s) performed (including Critical  Care):  Procedures   ____________________________________________   INITIAL IMPRESSION / ASSESSMENT AND PLAN / ED COURSE  As part of my medical decision making, I reviewed the following data within the electronic MEDICAL RECORD NUMBER Nursing notes reviewed and incorporated, Labs reviewed, EKG interpreted, Old chart reviewed, Radiograph reviewed and Notes from prior ED visits reviewed and incorporated        Patients symptoms not typical for emergent causes of abdominal pain such as, but not limited to, appendicitis, abdominal aortic aneurysm, surgical biliary disease, pancreatitis, SBO, mesenteric ischemia, serious intra-abdominal bacterial illness. Presentation also not typical of gynecologic emergencies such as TOA, Ovarian Torsion, PID. Not Ectopic. Doubt atypical ACS.  Pt tolerating PO. Disposition: Patient will be discharged with strict return precautions and follow up with primary MD within 12-24 hours for further evaluation. Patient understands that this still may have an early presentation of an emergent medical condition such as appendicitis that will require a recheck.      ____________________________________________   FINAL CLINICAL IMPRESSION(S) / ED DIAGNOSES  Final diagnoses:  Right lower quadrant abdominal pain     ED Discharge Orders    None       Note:  This document was prepared using Dragon voice recognition software and may include unintentional dictation errors.   Merwyn Katos, MD 09/04/20 [phone]

## 2020-09-04 NOTE — ED Triage Notes (Signed)
Pt has abd pain.  No n/v/d.  No appetite.  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  abd distended.  Family with pt.  Pt alert.

## 2020-10-30 ENCOUNTER — Emergency Department
Admission: EM | Admit: 2020-10-30 | Discharge: 2020-10-30 | Disposition: A | Discharge status: Home / Self Care | Payer: Medicare Other | Attending: Emergency Medicine | Admitting: Emergency Medicine

## 2020-10-30 ENCOUNTER — Other Ambulatory Visit: Payer: Self-pay

## 2020-10-30 DIAGNOSIS — Z7982 Long term (current) use of aspirin: Secondary | ICD-10-CM | POA: Insufficient documentation

## 2020-10-30 DIAGNOSIS — Z96642 Presence of left artificial hip joint: Secondary | ICD-10-CM | POA: Diagnosis not present

## 2020-10-30 DIAGNOSIS — R1030 Lower abdominal pain, unspecified: Secondary | ICD-10-CM | POA: Insufficient documentation

## 2020-10-30 DIAGNOSIS — Z8719 Personal history of other diseases of the digestive system: Secondary | ICD-10-CM | POA: Diagnosis not present

## 2020-10-30 LAB — URINALYSIS, COMPLETE (UACMP) WITH MICROSCOPIC
Bilirubin Urine: NEGATIVE
Glucose, UA: NEGATIVE mg/dL
Hgb urine dipstick: NEGATIVE
Ketones, ur: NEGATIVE mg/dL
Leukocytes,Ua: NEGATIVE
Nitrite: NEGATIVE
Protein, ur: NEGATIVE mg/dL
Specific Gravity, Urine: 1.002 — ABNORMAL LOW (ref 1.005–1.030)
Squamous Epithelial / HPF: NONE SEEN (ref 0–5)
pH: 7 (ref 5.0–8.0)

## 2020-10-30 LAB — CBC WITH DIFFERENTIAL/PLATELET
Abs Immature Granulocytes: 0.02 10*3/uL (ref 0.00–0.07)
Basophils Absolute: 0 10*3/uL (ref 0.0–0.1)
Basophils Relative: 1 %
Eosinophils Absolute: 0.2 10*3/uL (ref 0.0–0.5)
Eosinophils Relative: 4 %
HCT: 43.1 % (ref 36.0–46.0)
Hemoglobin: 14.3 g/dL (ref 12.0–15.0)
Immature Granulocytes: 0 %
Lymphocytes Relative: 13 %
Lymphs Abs: 0.7 10*3/uL (ref 0.7–4.0)
MCH: 28.6 pg (ref 26.0–34.0)
MCHC: 33.2 g/dL (ref 30.0–36.0)
MCV: 86.2 fL (ref 80.0–100.0)
Monocytes Absolute: 0.7 10*3/uL (ref 0.1–1.0)
Monocytes Relative: 12 %
Neutro Abs: 3.9 10*3/uL (ref 1.7–7.7)
Neutrophils Relative %: 70 %
Platelets: 147 10*3/uL — ABNORMAL LOW (ref 150–400)
RBC: 5 MIL/uL (ref 3.87–5.11)
RDW: 14.6 % (ref 11.5–15.5)
WBC: 5.6 10*3/uL (ref 4.0–10.5)
nRBC: 0 % (ref 0.0–0.2)

## 2020-10-30 LAB — COMPREHENSIVE METABOLIC PANEL
ALT: 13 U/L (ref 0–44)
AST: 22 U/L (ref 15–41)
Albumin: 3.9 g/dL (ref 3.5–5.0)
Alkaline Phosphatase: 94 U/L (ref 38–126)
Anion gap: 7 (ref 5–15)
BUN: 15 mg/dL (ref 8–23)
CO2: 22 mmol/L (ref 22–32)
Calcium: 9.3 mg/dL (ref 8.9–10.3)
Chloride: 111 mmol/L (ref 98–111)
Creatinine, Ser: 0.91 mg/dL (ref 0.44–1.00)
GFR, Estimated: 58 mL/min — ABNORMAL LOW (ref >60)
Glucose, Bld: 103 mg/dL — ABNORMAL HIGH (ref 70–99)
Potassium: 3.6 mmol/L (ref 3.5–5.1)
Sodium: 140 mmol/L (ref 135–145)
Total Bilirubin: 1 mg/dL (ref 0.3–1.2)
Total Protein: 6 g/dL — ABNORMAL LOW (ref 6.5–8.1)

## 2020-10-30 LAB — LIPASE, BLOOD: Lipase: 31 U/L (ref 11–51)

## 2020-10-30 NOTE — ED Notes (Signed)
Pt assisted to restroom by Eman NT using wheelchair as pt stated couldn't wait to finish triage.

## 2020-10-30 NOTE — ED Provider Notes (Signed)
[facility] Emergency Department Provider Note   ____________________________________________   Event Date/Time   First MD Initiated Contact with Patient 10/30/20 1123     (approximate)  I have reviewed the triage vital signs and the nursing notes.   HISTORY  Chief Complaint Abdominal Pain    HPI Faith Vasquez is a 87 y.o. female with past medical history of GERD, diverticulosis, and C. difficile who presents to the ED complaining of abdominal pain.  Patient states that she has been dealing with pain in both sides of her lower abdomen intermittently for the past few months.  Pain seem to come on today after she had eaten breakfast and was sitting in her room at [facility].  She describes the pain as a cramping that has now resolved.  She denies any associated constipation, diarrhea, nausea, vomiting, dysuria, hematuria, or flank pain.  She states she was seen in the ED for similar pain a few months ago with unremarkable work-up at that time.  She states she has seen her doctor on multiple times and they have told her that nothing is wrong.  She denies any fevers, cough, chest pain, or shortness of breath.        Past Medical History:  Diagnosis Date  . Diverticulosis   . History of Clostridium difficile infection    uncertain dates, possibly in 2017 or 2018    Patient Active Problem List   Diagnosis Date Noted  . Closed left hip fracture (HCC) 03/24/2020  . Left displaced femoral neck fracture (HCC) 03/24/2020  . GERD (gastroesophageal reflux disease) 03/24/2020    Past Surgical History:  Procedure Laterality Date  . ABDOMINAL HYSTERECTOMY    . CHOLECYSTECTOMY    . HIP ARTHROPLASTY Left 03/25/2020   Procedure: ARTHROPLASTY BIPOLAR HIP (HEMIARTHROPLASTY);  Surgeon: Kennedy Bucker, MD;  Location: [facility] ORS;  Service: Orthopedics;  Laterality: Left;    Prior to Admission medications   Medication Sig Start Date End Date Taking? Authorizing  Provider  acetaminophen (TYLENOL) 325 MG tablet Take 2 tablets (650 mg total) by mouth every 6 (six) hours. 03/27/20   Evon Slack, PA-C  aspirin 81 MG chewable tablet Chew 81 mg by mouth daily.    [provider]  atorvastatin (LIPITOR) 20 MG tablet Take 20 mg by mouth at bedtime. 03/15/20   [provider]  Cholecalciferol (VITAMIN D3) 1.25 MG (50000 UT) CAPS Take 1 capsule by mouth once a week. Wednesday 03/22/20   [provider]  feeding supplement (ENSURE ENLIVE / ENSURE PLUS) LIQD Take 237 mLs by mouth 2 (two) times daily between meals. 03/27/20   Darlin Priestly, MD  polyethylene glycol (MIRALAX / GLYCOLAX) 17 g packet Take 17 g by mouth 2 (two) times daily. 03/27/20   Darlin Priestly, MD    Allergies Codeine, Iodine, and Ciprofloxacin  No family history on file.  Social History Social History   Tobacco Use  . Smoking status: Never Smoker  . Smokeless tobacco: Never Used  Substance Use Topics  . Alcohol use: Never  . Drug use: Never    Review of Systems  Constitutional: No fever/chills Eyes: No visual changes. ENT: No sore throat. Cardiovascular: Denies chest pain. Respiratory: Denies shortness of breath. Gastrointestinal: Positive for abdominal pain.  No nausea, no vomiting.  No diarrhea.  No constipation. Genitourinary: Negative for dysuria. Musculoskeletal: Negative for back pain. Skin: Negative for rash. Neurological: Negative for headaches, focal weakness or numbness.  ____________________________________________   PHYSICAL EXAM:  VITAL  SIGNS: ED Triage Vitals  Enc Vitals Group     BP      Pulse      Resp      Temp      Temp src      SpO2      Weight      Height      Head Circumference      Peak Flow      Pain Score      Pain Loc      Pain Edu?      Excl. in GC?     Constitutional: Alert and oriented. Eyes: Conjunctivae are normal. Head: Atraumatic. Nose: No congestion/rhinnorhea. Mouth/Throat: Mucous membranes are  moist. Neck: Normal ROM Cardiovascular: Normal rate, regular rhythm. Grossly normal heart sounds.  2+ radial pulses bilaterally. Respiratory: Normal respiratory effort.  No retractions. Lungs CTAB. Gastrointestinal: Soft and nontender. No distention. Genitourinary: deferred Musculoskeletal: No lower extremity tenderness nor edema. Neurologic:  Normal speech and language. No gross focal neurologic deficits are appreciated. Skin:  Skin is warm, dry and intact. No rash noted. Psychiatric: Mood and affect are normal. Speech and behavior are normal.  ____________________________________________   LABS (all labs ordered are listed, but only abnormal results are displayed)  Labs Reviewed  CBC WITH DIFFERENTIAL/PLATELET - Abnormal; Notable for the following components:      Result Value   Platelets 147 (*)    All other components within normal limits  COMPREHENSIVE METABOLIC PANEL - Abnormal; Notable for the following components:   Glucose, Bld 103 (*)    Total Protein 6.0 (*)    GFR, Estimated 58 (*)    All other components within normal limits  URINALYSIS, COMPLETE (UACMP) WITH MICROSCOPIC - Abnormal; Notable for the following components:   Color, Urine COLORLESS (*)    APPearance CLEAR (*)    Specific Gravity, Urine 1.002 (*)    Bacteria, UA RARE (*)    All other components within normal limits  LIPASE, BLOOD    PROCEDURES  Procedure(s) performed (including Critical Care):  Procedures   ____________________________________________   INITIAL IMPRESSION / ASSESSMENT AND PLAN / ED COURSE       87 year old female with past medical history of GERD, diverticulosis, and C. difficile who presents to the ED with intermittent abdominal pain for the past few months, had another episode today after eating breakfast.  She now states that pain has resolved and she has no tenderness on abdominal exam.  Vital signs are reassuring and we will check labs.  Potential imaging was discussed  with patient and her daughter-in-law, who agree that it would be reasonable to hold off on CT of abdomen/pelvis given resolution of pain and chronicity of her symptoms.  If patient were to have recurrent pain or labs are concerning, we may proceed with CT scan.  UA is also pending but patient denies any urinary symptoms.  Lab work is unremarkable and UA shows no signs of infection.  Patient has had no further abdominal pain or other symptoms here in the ED.  Given recurrent nature of her symptoms with reassuring work-up here in the ED, patient is appropriate for discharge home with PCP follow-up.  Patient and daughter-in-law counseled to return to the ED for new worsening symptoms, patient and family agree with plan.      ____________________________________________   FINAL CLINICAL IMPRESSION(S) / ED DIAGNOSES  Final diagnoses:  Lower abdominal pain     ED Discharge Orders    None  Note:  This document was prepared using Dragon voice recognition software and may include unintentional dictation errors.   Chesley Noon, MD 10/30/20 1304

## 2020-10-30 NOTE — ED Notes (Signed)
Pt just got done telling this RN that her pain is "constant" but then told EDP Jessup "yes" when he asked if it "comes and goes". Pt currently denying pain. Denies burning upon urination, constipation or diarrhea, back pain. Resp reg/unlabored, laying calmly on stretcher, skin dry. Stretcher locked low, rails up, call bell within reach, daughter-in-law remains at bedside.

## 2020-10-30 NOTE — ED Triage Notes (Signed)
Pt in from [facility] d/t lower/mid abdominal pain. EMS states when abdomen palpated pt "let out a lot of gas". Pt has pain/tenderness in LLQ when this RN palpates abdomen. VSS with EMS. Pt A&Ox4 and steady when standing; is a one assist.

## 2020-10-30 NOTE — ED Notes (Signed)
[phone] report given to [facility] RN.

## 2020-12-13 ENCOUNTER — Other Ambulatory Visit: Payer: Self-pay

## 2020-12-13 ENCOUNTER — Emergency Department
Admission: EM | Admit: 2020-12-13 | Discharge: 2020-12-13 | Disposition: A | Discharge status: Home / Self Care | Payer: Medicare Other | Attending: Emergency Medicine | Admitting: Emergency Medicine

## 2020-12-13 ENCOUNTER — Emergency Department: Payer: Medicare Other

## 2020-12-13 DIAGNOSIS — R140 Abdominal distension (gaseous): Secondary | ICD-10-CM | POA: Diagnosis not present

## 2020-12-13 DIAGNOSIS — B9689 Other specified bacterial agents as the cause of diseases classified elsewhere: Secondary | ICD-10-CM | POA: Diagnosis not present

## 2020-12-13 DIAGNOSIS — N3090 Cystitis, unspecified without hematuria: Secondary | ICD-10-CM | POA: Diagnosis not present

## 2020-12-13 DIAGNOSIS — Z7982 Long term (current) use of aspirin: Secondary | ICD-10-CM | POA: Diagnosis not present

## 2020-12-13 DIAGNOSIS — R1030 Lower abdominal pain, unspecified: Secondary | ICD-10-CM | POA: Diagnosis present

## 2020-12-13 DIAGNOSIS — Z96642 Presence of left artificial hip joint: Secondary | ICD-10-CM | POA: Insufficient documentation

## 2020-12-13 LAB — URINALYSIS, COMPLETE (UACMP) WITH MICROSCOPIC
Bilirubin Urine: NEGATIVE
Glucose, UA: NEGATIVE mg/dL
Hgb urine dipstick: NEGATIVE
Ketones, ur: NEGATIVE mg/dL
Nitrite: POSITIVE — AB
Protein, ur: NEGATIVE mg/dL
Specific Gravity, Urine: 1.013 (ref 1.005–1.030)
pH: 5 (ref 5.0–8.0)

## 2020-12-13 LAB — CBC
HCT: 42.3 % (ref 36.0–46.0)
Hemoglobin: 13.8 g/dL (ref 12.0–15.0)
MCH: 28.6 pg (ref 26.0–34.0)
MCHC: 32.6 g/dL (ref 30.0–36.0)
MCV: 87.8 fL (ref 80.0–100.0)
Platelets: 167 10*3/uL (ref 150–400)
RBC: 4.82 MIL/uL (ref 3.87–5.11)
RDW: 14.4 % (ref 11.5–15.5)
WBC: 6.6 10*3/uL (ref 4.0–10.5)
nRBC: 0 % (ref 0.0–0.2)

## 2020-12-13 LAB — COMPREHENSIVE METABOLIC PANEL
ALT: 13 U/L (ref 0–44)
AST: 22 U/L (ref 15–41)
Albumin: 3.9 g/dL (ref 3.5–5.0)
Alkaline Phosphatase: 80 U/L (ref 38–126)
Anion gap: 7 (ref 5–15)
BUN: 18 mg/dL (ref 8–23)
CO2: 24 mmol/L (ref 22–32)
Calcium: 9.6 mg/dL (ref 8.9–10.3)
Chloride: 108 mmol/L (ref 98–111)
Creatinine, Ser: 1.04 mg/dL — ABNORMAL HIGH (ref 0.44–1.00)
GFR, Estimated: 49 mL/min — ABNORMAL LOW (ref >60)
Glucose, Bld: 129 mg/dL — ABNORMAL HIGH (ref 70–99)
Potassium: 3.8 mmol/L (ref 3.5–5.1)
Sodium: 139 mmol/L (ref 135–145)
Total Bilirubin: 0.8 mg/dL (ref 0.3–1.2)
Total Protein: 6.3 g/dL — ABNORMAL LOW (ref 6.5–8.1)

## 2020-12-13 LAB — LIPASE, BLOOD: Lipase: 34 U/L (ref 11–51)

## 2020-12-13 MED ORDER — SODIUM CHLORIDE 0.9 % IV SOLN
1.0000 g | Freq: Once | INTRAVENOUS | Status: DC
  Filled 2020-12-13: qty 10

## 2020-12-13 MED ORDER — CEPHALEXIN 500 MG PO CAPS
500.0000 mg | ORAL_CAPSULE | Freq: Once | ORAL | Status: AC
  Administered 2020-12-13: 500 mg via ORAL
  Filled 2020-12-13: qty 1

## 2020-12-13 MED ORDER — ACETAMINOPHEN 500 MG PO TABS
1000.0000 mg | ORAL_TABLET | Freq: Once | ORAL | Status: AC
  Administered 2020-12-13: 1000 mg via ORAL
  Filled 2020-12-13: qty 2

## 2020-12-13 MED ORDER — CEPHALEXIN 500 MG PO CAPS: 500.0000 mg | ORAL_CAPSULE | Freq: Two times a day (BID) | ORAL | 0 refills | Status: DC

## 2020-12-13 NOTE — ED Notes (Signed)
Assisted patient to bathroom.

## 2020-12-13 NOTE — ED Provider Notes (Signed)
[facility] Emergency Department Provider Note  ____________________________________________  Time seen: Approximately 1:23 PM  I have reviewed the triage vital signs and the nursing notes.   HISTORY  Chief Complaint Abdominal Pain    HPI Faith Vasquez is a 87 y.o. female with a history of diverticulosis, dementia who comes ED complaining of lower abdominal pain and dysuria for the past few days.  Had a normal bowel movement today.  No nausea or vomiting.  No fevers or chills.  Was evaluated by medical personnel at [facility] who noted unusual abdominal distention and sent her to the ED for evaluation.  Pain is constant, mild, nonradiating, no aggravating or alleviating factors.    Past Medical History:  Diagnosis Date  . Diverticulosis   . History of Clostridium difficile infection    uncertain dates, possibly in 2017 or 2018     Patient Active Problem List   Diagnosis Date Noted  . Closed left hip fracture (HCC) 03/24/2020  . Left displaced femoral neck fracture (HCC) 03/24/2020  . GERD (gastroesophageal reflux disease) 03/24/2020     Past Surgical History:  Procedure Laterality Date  . ABDOMINAL HYSTERECTOMY    . CHOLECYSTECTOMY    . HIP ARTHROPLASTY Left 03/25/2020   Procedure: ARTHROPLASTY BIPOLAR HIP (HEMIARTHROPLASTY);  Surgeon: Kennedy Bucker, MD;  Location: [facility] ORS;  Service: Orthopedics;  Laterality: Left;     Prior to Admission medications   Medication Sig Start Date End Date Taking? Authorizing Provider  acetaminophen (TYLENOL) 325 MG tablet Take 2 tablets (650 mg total) by mouth every 6 (six) hours. 03/27/20   Evon Slack, PA-C  aspirin 81 MG chewable tablet Chew 81 mg by mouth daily.    [provider]  atorvastatin (LIPITOR) 20 MG tablet Take 20 mg by mouth at bedtime. 03/15/20   [provider]  Cholecalciferol (VITAMIN D3) 1.25 MG (50000 UT) CAPS Take 1 capsule by mouth once a week. Wednesday  03/22/20   [provider]  feeding supplement (ENSURE ENLIVE / ENSURE PLUS) LIQD Take 237 mLs by mouth 2 (two) times daily between meals. 03/27/20   Darlin Priestly, MD  polyethylene glycol (MIRALAX / GLYCOLAX) 17 g packet Take 17 g by mouth 2 (two) times daily. 03/27/20   Darlin Priestly, MD     Allergies Codeine, Iodine, and Ciprofloxacin   No family history on file.  Social History Social History   Tobacco Use  . Smoking status: Never  . Smokeless tobacco: Never  Substance Use Topics  . Alcohol use: Never  . Drug use: Never    Review of Systems  Constitutional:   No fever or chills.  ENT:   No sore throat. No rhinorrhea. Cardiovascular:   No chest pain or syncope. Respiratory:   No dyspnea or cough. Gastrointestinal:   Positive lower abdominal pain as above without vomiting and diarrhea.  Musculoskeletal:   Negative for focal pain or swelling All other systems reviewed and are negative except as documented above in ROS and HPI.  ____________________________________________   PHYSICAL EXAM:  VITAL SIGNS: ED Triage Vitals  Enc Vitals Group     BP 12/13/20 1105 118/82     Pulse Rate 12/13/20 1105 79     Resp 12/13/20 1105 18     Temp 12/13/20 1105 98 F (36.7 C)     Temp Source 12/13/20 1105 Oral     SpO2 12/13/20 1105 93 %     Weight 12/13/20 1106 140 lb (63.5 kg)  Height 12/13/20 1106 5\' 5"  (1.651 m)     Head Circumference --      Peak Flow --      Pain Score 12/13/20 1105 0     Pain Loc --      Pain Edu? --      Excl. in GC? --     Vital signs reviewed, nursing assessments reviewed.   Constitutional:   Alert and oriented. Non-toxic appearance. Eyes:   Conjunctivae are normal. EOMI. PERRL. ENT      Head:   Normocephalic and atraumatic.      Nose:   Wearing a mask.      Mouth/Throat:   Wearing a mask.      Neck:   No meningismus. Full ROM. Hematological/Lymphatic/Immunilogical:   No cervical lymphadenopathy. Cardiovascular:   RRR. Symmetric  bilateral radial and DP pulses.  No murmurs. Cap refill less than 2 seconds. Respiratory:   Normal respiratory effort without tachypnea/retractions. Breath sounds are clear and equal bilaterally. No wheezes/rales/rhonchi. Gastrointestinal:   Soft with left-sided abdominal tenderness and suprapubic tenderness.. Non distended. There is no CVA tenderness.  No rebound, rigidity, or guarding. Genitourinary:   deferred Musculoskeletal:   Normal range of motion in all extremities. No joint effusions.  No lower extremity tenderness.  No edema. Neurologic:   Normal speech and language.  Motor grossly intact. No acute focal neurologic deficits are appreciated.  Skin:    Skin is warm, dry and intact. No rash noted.  No petechiae, purpura, or bullae.  ____________________________________________    LABS (pertinent positives/negatives) (all labs ordered are listed, but only abnormal results are displayed) Labs Reviewed  COMPREHENSIVE METABOLIC PANEL - Abnormal; Notable for the following components:      Result Value   Glucose, Bld 129 (*)    Creatinine, Ser 1.04 (*)    Total Protein 6.3 (*)    GFR, Estimated 49 (*)    All other components within normal limits  URINALYSIS, COMPLETE (UACMP) WITH MICROSCOPIC - Abnormal; Notable for the following components:   Color, Urine YELLOW (*)    APPearance HAZY (*)    Nitrite POSITIVE (*)    Leukocytes,Ua MODERATE (*)    Bacteria, UA FEW (*)    All other components within normal limits  URINE CULTURE  LIPASE, BLOOD  CBC   ____________________________________________   EKG    ____________________________________________    RADIOLOGY  No results found.  ____________________________________________   PROCEDURES Procedures  ____________________________________________  DIFFERENTIAL DIAGNOSIS   Diverticulitis, cystitis, bowel obstruction, bowel perforation, intra-abdominal abscess  CLINICAL IMPRESSION / ASSESSMENT AND PLAN / ED  COURSE  Medications ordered in the ED: Medications  cefTRIAXone (ROCEPHIN) 1 g in sodium chloride 0.9 % 100 mL IVPB (has no administration in time range)    Pertinent labs & imaging results that were available during my care of the patient were reviewed by me and considered in my medical decision making (see chart for details).  Faith Vasquez was evaluated in Emergency Department on 12/13/2020 for the symptoms described in the history of present illness. She was evaluated in the context of the global COVID-19 pandemic, which necessitated consideration that the patient might be at risk for infection with the SARS-CoV-2 virus that causes COVID-19. Institutional protocols and algorithms that pertain to the evaluation of patients at risk for COVID-19 are in a state of rapid change based on information released by regulatory bodies including the CDC and federal and state organizations. These policies and algorithms were followed during the patient's  care in the ED.   Patient presents with abdominal distention and tenderness.  Vital signs are normal.  Serum labs unremarkable.  Urinalysis consistent with UTI.  CT scan obtained for further evaluation.  Clinical Course as of 12/13/20 1428  Thu Dec 13, 2020  1427 CT unremarkable.  Labs show a UTI.  Will treat with Keflex for cystitis, simethicone as needed for bloating [PS]  1428 Care discussed with son at bedside who is agreeable with this plan, also notes that the abdominal symptoms are chronic recurrent issue that she has had specialist evaluation for in the past and was ultimately attributed to dementia symptoms and anxiety. [PS]    Clinical Course User Index [PS] Sharman Cheek, MD     ____________________________________________   FINAL CLINICAL IMPRESSION(S) / ED DIAGNOSES    Final diagnoses:  Abdominal distension  Cystitis     ED Discharge Orders     None       Portions of this note were generated with dragon dictation  software. Dictation errors may occur despite best attempts at proofreading.    Sharman Cheek, MD 12/13/20 1325

## 2020-12-13 NOTE — Discharge Instructions (Addendum)
Your CT scan is unremarkable.  The labs show a bladder infection.  Simethicone can help with abdominal bloating.

## 2020-12-13 NOTE — ED Triage Notes (Signed)
Pt to ED with daughter in law from [employer] for lower abd pain and abdomen distention for months, reports worsening symptoms.  Denies constipation. Denies N/V/D Denies problems eating or drinking Pt denies pain at this time Daughter in law reports some dementia.

## 2020-12-15 LAB — URINE CULTURE: Culture: >=100000 — AB

## 2021-06-25 ENCOUNTER — Emergency Department
Admission: EM | Admit: 2021-06-25 | Discharge: 2021-06-25 | Disposition: A | Discharge status: Home / Self Care | Payer: Medicare Other | Attending: Emergency Medicine | Admitting: Emergency Medicine

## 2021-06-25 ENCOUNTER — Emergency Department: Payer: Medicare Other

## 2021-06-25 DIAGNOSIS — R0789 Other chest pain: Secondary | ICD-10-CM | POA: Insufficient documentation

## 2021-06-25 DIAGNOSIS — E876 Hypokalemia: Secondary | ICD-10-CM | POA: Diagnosis not present

## 2021-06-25 DIAGNOSIS — R079 Chest pain, unspecified: Secondary | ICD-10-CM

## 2021-06-25 LAB — BASIC METABOLIC PANEL
Anion gap: 8 (ref 5–15)
BUN: 21 mg/dL (ref 8–23)
CO2: 24 mmol/L (ref 22–32)
Calcium: 9.8 mg/dL (ref 8.9–10.3)
Chloride: 105 mmol/L (ref 98–111)
Creatinine, Ser: 1.33 mg/dL — ABNORMAL HIGH (ref 0.44–1.00)
GFR, Estimated: 37 mL/min — ABNORMAL LOW (ref >60)
Glucose, Bld: 153 mg/dL — ABNORMAL HIGH (ref 70–99)
Potassium: 3.3 mmol/L — ABNORMAL LOW (ref 3.5–5.1)
Sodium: 137 mmol/L (ref 135–145)

## 2021-06-25 LAB — CBC
HCT: 44.7 % (ref 36.0–46.0)
Hemoglobin: 14.5 g/dL (ref 12.0–15.0)
MCH: 28.5 pg (ref 26.0–34.0)
MCHC: 32.4 g/dL (ref 30.0–36.0)
MCV: 88 fL (ref 80.0–100.0)
Platelets: 162 10*3/uL (ref 150–400)
RBC: 5.08 MIL/uL (ref 3.87–5.11)
RDW: 14.2 % (ref 11.5–15.5)
WBC: 7.2 10*3/uL (ref 4.0–10.5)
nRBC: 0 % (ref 0.0–0.2)

## 2021-06-25 LAB — TROPONIN I (HIGH SENSITIVITY)
Troponin I (High Sensitivity): 9 ng/L (ref <18)
Troponin I (High Sensitivity): 12 ng/L (ref <18)

## 2021-06-25 NOTE — Discharge Instructions (Signed)
Please follow up with cardiology and primary care  Return to the ER if chest pain returns.

## 2021-06-25 NOTE — ED Notes (Signed)
Pt discharge information reviewed. Pt understands need for follow up care and when to return if symptoms worsen. All questions answered. Pt is alert and oriented with even and regular respirations. Pt is brought out of department in wheelchair by family back to nursing home.

## 2021-06-25 NOTE — ED Provider Notes (Signed)
[facility] Provider Note    Event Date/Time   First MD Initiated Contact with Patient 06/25/21 1747     (approximate)   History   Chest Pain (Mild left side, no pain longer )   HPI  Faith Vasquez is a 88 y.o. female with history of diverticulosis, C. difficile and as listed in EMR presents to the emergency department for treatment and evaluation of transient left side chest pain.  Patient states that she had just sat down to eat and had a sharp pain.  Pain lasted for a couple of minutes.  No cardiac history.    Physical Exam   Today's Vitals   06/25/21 1757 06/25/21 1800 06/25/21 1900 06/25/21 2138  BP:  (!) 154/82 (!) 145/88 (!) 159/91  Pulse:  100 74 77  Resp:  (!) 28 20 18   Temp:  98.2 F (36.8 C)  98 F (36.7 C)  TempSrc:  Oral  Oral  SpO2:   97% 100%  Weight: 50 kg     Height: 5\' 7"  (1.702 m)     PainSc: 0-No pain      Body mass index is 17.26 kg/m.    Most recent vital signs: Vitals:   06/25/21 1900 06/25/21 2138  BP: (!) 145/88 (!) 159/91  Pulse: 74 77  Resp: 20 18  Temp:  98 F (36.7 C)  SpO2: 97% 100%    General: Awake, no distress.  CV:  Good peripheral perfusion.  Resp:  Normal effort.  Abd:  No distention.  Other:     ED Results / Procedures / Treatments   Labs (all labs ordered are listed, but only abnormal results are displayed) Labs Reviewed  BASIC METABOLIC PANEL - Abnormal; Notable for the following components:      Result Value   Potassium 3.3 (*)    Glucose, Bld 153 (*)    Creatinine, Ser 1.33 (*)    GFR, Estimated 37 (*)    All other components within normal limits  CBC  TROPONIN I (HIGH SENSITIVITY)  TROPONIN I (HIGH SENSITIVITY)     EKG  ED ECG REPORT I, Dyer Klug, FNP-BC personally viewed and interpreted this ECG.   Date: 06/25/2021  EKG Time: 1751  Rate: 79  Rhythm: unchanged from previous tracings, normal sinus rhythm, LBBB  Axis: normal  Intervals:none  ST&T Change: no ST  changes    RADIOLOGY  Image and radiology report reviewed by me.  Chest x-ray shows asymmetric airspace disease present in the left lower lobe consistent with atelectasis or infection.  PROCEDURES:  Critical Care performed: No  Procedures   MEDICATIONS ORDERED IN ED: Medications - No data to display   IMPRESSION / MDM / ASSESSMENT AND PLAN / ED COURSE  I reviewed the triage vital signs and the nursing notes.                              Differential diagnosis includes, but is not limited to, CAD, MI, NSTEMI, atypical chest pain.  Nonspecific chest pain.   The patient is on the cardiac monitor to evaluate for evidence of arrhythmia and/or significant heart rate changes.  88 year old female presents to the emergency department for treatment and evaluation after experiencing left-sided chest wall pain.  See HPI for further details.  Plan will be to perform cardiac work-up.  Vital signs are reassuring.  She is not tachycardic, tachypneic, hypoxic, febrile, or hypotensive.  EKG showing  sinus rhythm with a left bundle branch block which is unchanged from previous performed on March 24, 2020.  Initial troponin is normal.  CBC is normal.  She has a very mild hypokalemia at 3.3.   Second troponin also within normal limits. Patient has had no return of chest pain and has been resting well. Family have been at bedside with her. Plan will be to discharge her home with ER return precautions. She is also encouraged to follow up with cardiology and primary care.       FINAL CLINICAL IMPRESSION(S) / ED DIAGNOSES   Final diagnoses:  Nonspecific chest pain     Rx / DC Orders   ED Discharge Orders     None        Note:  This document was prepared using Dragon voice recognition software and may include unintentional dictation errors.   Chinita Pester, FNP 06/25/21 2224    Gilles Chiquito, MD 06/25/21 2228

## 2021-06-25 NOTE — ED Triage Notes (Signed)
Started tonight left side cp , no longer having pain

## 2021-08-12 ENCOUNTER — Other Ambulatory Visit: Payer: Self-pay

## 2021-08-12 ENCOUNTER — Emergency Department
Admission: EM | Admit: 2021-08-12 | Discharge: 2021-08-12 | Disposition: A | Discharge status: Home / Self Care | Payer: Medicare Other | Attending: Emergency Medicine | Admitting: Emergency Medicine

## 2021-08-12 ENCOUNTER — Emergency Department: Payer: Medicare Other

## 2021-08-12 DIAGNOSIS — M79622 Pain in left upper arm: Secondary | ICD-10-CM | POA: Insufficient documentation

## 2021-08-12 DIAGNOSIS — R519 Headache, unspecified: Secondary | ICD-10-CM | POA: Diagnosis not present

## 2021-08-12 DIAGNOSIS — M25512 Pain in left shoulder: Secondary | ICD-10-CM | POA: Diagnosis present

## 2021-08-12 DIAGNOSIS — W19XXXA Unspecified fall, initial encounter: Secondary | ICD-10-CM | POA: Diagnosis not present

## 2021-08-12 DIAGNOSIS — F0390 Unspecified dementia without behavioral disturbance: Secondary | ICD-10-CM | POA: Diagnosis not present

## 2021-08-12 LAB — URINALYSIS, ROUTINE W REFLEX MICROSCOPIC
Bilirubin Urine: NEGATIVE
Glucose, UA: NEGATIVE mg/dL
Hgb urine dipstick: NEGATIVE
Ketones, ur: NEGATIVE mg/dL
Leukocytes,Ua: NEGATIVE
Nitrite: NEGATIVE
Protein, ur: NEGATIVE mg/dL
Specific Gravity, Urine: 1.004 — ABNORMAL LOW (ref 1.005–1.030)
pH: 7 (ref 5.0–8.0)

## 2021-08-12 MED ORDER — ACETAMINOPHEN 325 MG PO TABS
650.0000 mg | ORAL_TABLET | Freq: Once | ORAL | Status: AC
  Administered 2021-08-12: 650 mg via ORAL
  Filled 2021-08-12: qty 2

## 2021-08-12 NOTE — ED Triage Notes (Signed)
In from [facility] via EMS due to fall; unwitnessed; pt c/o pain at L shoulder; no hematoma or bleeding noted to head; pt alert and verbal; history of dementia and wandering. Pt calm and cooperative currently. Son and daughter in law here. 190/104 BP; 79 HR; 98% RA O2 with EMS.  ?

## 2021-08-12 NOTE — ED Notes (Signed)
[phone] report given to [facility]. Pt's daughter taking her back now and daughter has d/c paperwork.  ?

## 2021-08-12 NOTE — ED Notes (Signed)
Pt's family at bedside. Deny that pt is on blood thinners/anticoagulants. Non-slip socks placed on pt. Family refused IV placement and blood-work for pt currently.  ?

## 2021-08-12 NOTE — Discharge Instructions (Signed)
You can continue to take 650 mg of Tylenol every 4 hours as needed for shoulder pain. ? ? ? ?

## 2021-08-12 NOTE — ED Notes (Signed)
Pt to be assisted to restroom using wheelchair after imaging staff done as they're currently at bedside.  ?

## 2021-08-12 NOTE — ED Provider Notes (Signed)
? ?[facility] ?Provider Note ? ?Patient Contact: 9:07 PM (approximate) ? ? ?History  ? ?Shoulder Injury ? ? ?HPI ? ?Faith Vasquez is a 88 y.o. female presents to the emergency department after patient had a mechanical fall while waiting for her friends to arrive in the dining room at [facility].  Fall was unwitnessed but patient is complaining of pain along the left shoulder and left upper arm.  Patient does have a history of dementia and has a hard time providing historical information.  Son and daughter-in-law have not heard patient complained of any other pain since arriving in the emergency department. ? ?  ? ? ?Physical Exam  ? ?Triage Vital Signs: ?ED Triage Vitals  ?Enc Vitals Group  ?   BP --   ?   Pulse --   ?   Resp --   ?   Temp 08/12/21 2053 (!) 96.7 ?F (35.9 ?C)  ?   Temp Source 08/12/21 2053 Axillary  ?   SpO2 --   ?   Weight 08/12/21 2058 130 lb (59 kg)  ?   Height 08/12/21 2058 5\' 5"  (1.651 m)  ?   Head Circumference --   ?   Peak Flow --   ?   Pain Score 08/12/21 2057 7  ?   Pain Loc --   ?   Pain Edu? --   ?   Excl. in GC? --   ? ? ?Most recent vital signs: ?Vitals:  ? 08/12/21 2215 08/12/21 2239  ?BP:  (!) 154/83  ?Pulse: 76   ?Temp:    ?SpO2: 97%   ? ? ? ?General: Alert and in no acute distress. ?Eyes:  PERRL. EOMI. ?Head: No acute traumatic findings ?ENT: ?     Ears:  ?     Nose: No congestion/rhinnorhea. ?     Mouth/Throat: Mucous membranes are moist. ?Neck: No stridor. No cervical spine tenderness to palpation. ?Cardiovascular:  Good peripheral perfusion ?Respiratory: Normal respiratory effort without tachypnea or retractions. Lungs CTAB. Good air entry to the bases with no decreased or absent breath sounds. ?Gastrointestinal: Bowel sounds ?4 quadrants. Soft and nontender to palpation. No guarding or rigidity. No palpable masses. No distention. No CVA tenderness. ?Musculoskeletal: Full range of motion to all extremities.  ?Neurologic:  No gross focal neurologic  deficits are appreciated.  ?Skin:   No rash noted ?Other: ? ? ?ED Results / Procedures / Treatments  ? ?Labs ?(all labs ordered are listed, but only abnormal results are displayed) ?Labs Reviewed  ?URINALYSIS, ROUTINE W REFLEX MICROSCOPIC - Abnormal; Notable for the following components:  ?    Result Value  ? Color, Urine STRAW (*)   ? APPearance CLEAR (*)   ? Specific Gravity, Urine 1.004 (*)   ? All other components within normal limits  ? ? ? ? ?RADIOLOGY ? ?I personally viewed and evaluated these images as part of my medical decision making, as well as reviewing the written report by the radiologist. ? ?ED Provider Interpretation: I personally reviewed x-rays of the left shoulder and CTs of the head and cervical spine.  I agree with radiologist interpretation of no acute abnormality. ? ? ?PROCEDURES: ? ?Critical Care performed: No ? ?Procedures ? ? ?MEDICATIONS ORDERED IN ED: ?Medications  ?acetaminophen (TYLENOL) tablet 650 mg (650 mg Oral Given 08/12/21 2238)  ? ? ? ?IMPRESSION / MDM / ASSESSMENT AND PLAN / ED COURSE  ?I reviewed the triage vital signs and the nursing notes. ?             ?               ? ?  Differential diagnosis includes, but is not limited to, fall... ? ?Assessment and Plan: ?Fall:  ?88 year old female presents to the emergency department after an unwitnessed fall. ? ?CTs were obtained of the head and cervical spine which showed no evidence of intracranial bleed, skull fracture or C-spine fracture.  Patient had no acute bony abnormality of the left shoulder and no signs of UTI.  Tylenol was recommended for discomfort and a referral to orthopedics was given. ? ? ?FINAL CLINICAL IMPRESSION(S) / ED DIAGNOSES  ? ?Final diagnoses:  ?Acute pain of left shoulder  ? ? ? ?Rx / DC Orders  ? ?ED Discharge Orders   ? ? None  ? ?  ? ? ? ?Note:  This document was prepared using Dragon voice recognition software and may include unintentional dictation errors. ?  ?Orvil Feil, PA-C ?08/12/21 2250 ? ?   ?Phineas Semen, MD ?08/12/21 2301 ? ?

## 2021-09-19 ENCOUNTER — Emergency Department: Payer: Medicare Other

## 2021-09-19 ENCOUNTER — Inpatient Hospital Stay
Admission: EM | Admit: 2021-09-19 | Discharge: 2021-09-23 | DRG: 871 | Disposition: A | Discharge status: Skilled Nursing Facility | Payer: Medicare Other | Source: Skilled Nursing Facility | Attending: Student in an Organized Health Care Education/Training Program | Admitting: Student in an Organized Health Care Education/Training Program

## 2021-09-19 DIAGNOSIS — E860 Dehydration: Secondary | ICD-10-CM | POA: Diagnosis present

## 2021-09-19 DIAGNOSIS — J9601 Acute respiratory failure with hypoxia: Secondary | ICD-10-CM

## 2021-09-19 DIAGNOSIS — Z79899 Other long term (current) drug therapy: Secondary | ICD-10-CM | POA: Diagnosis not present

## 2021-09-19 DIAGNOSIS — K3189 Other diseases of stomach and duodenum: Secondary | ICD-10-CM | POA: Diagnosis present

## 2021-09-19 DIAGNOSIS — Z7982 Long term (current) use of aspirin: Secondary | ICD-10-CM | POA: Diagnosis not present

## 2021-09-19 DIAGNOSIS — R0603 Acute respiratory distress: Principal | ICD-10-CM

## 2021-09-19 DIAGNOSIS — J9600 Acute respiratory failure, unspecified whether with hypoxia or hypercapnia: Secondary | ICD-10-CM | POA: Diagnosis present

## 2021-09-19 DIAGNOSIS — D696 Thrombocytopenia, unspecified: Secondary | ICD-10-CM | POA: Diagnosis present

## 2021-09-19 DIAGNOSIS — J188 Other pneumonia, unspecified organism: Secondary | ICD-10-CM | POA: Diagnosis present

## 2021-09-19 DIAGNOSIS — J189 Pneumonia, unspecified organism: Secondary | ICD-10-CM | POA: Diagnosis present

## 2021-09-19 DIAGNOSIS — R140 Abdominal distension (gaseous): Secondary | ICD-10-CM | POA: Diagnosis not present

## 2021-09-19 DIAGNOSIS — Z90710 Acquired absence of both cervix and uterus: Secondary | ICD-10-CM | POA: Diagnosis not present

## 2021-09-19 DIAGNOSIS — K219 Gastro-esophageal reflux disease without esophagitis: Secondary | ICD-10-CM | POA: Diagnosis present

## 2021-09-19 DIAGNOSIS — I10 Essential (primary) hypertension: Secondary | ICD-10-CM | POA: Diagnosis present

## 2021-09-19 DIAGNOSIS — Z9049 Acquired absence of other specified parts of digestive tract: Secondary | ICD-10-CM | POA: Diagnosis not present

## 2021-09-19 DIAGNOSIS — D649 Anemia, unspecified: Secondary | ICD-10-CM | POA: Diagnosis present

## 2021-09-19 DIAGNOSIS — Z20822 Contact with and (suspected) exposure to covid-19: Secondary | ICD-10-CM | POA: Diagnosis present

## 2021-09-19 DIAGNOSIS — E876 Hypokalemia: Secondary | ICD-10-CM | POA: Diagnosis present

## 2021-09-19 DIAGNOSIS — Z96642 Presence of left artificial hip joint: Secondary | ICD-10-CM | POA: Diagnosis present

## 2021-09-19 DIAGNOSIS — F0390 Unspecified dementia without behavioral disturbance: Secondary | ICD-10-CM | POA: Diagnosis present

## 2021-09-19 DIAGNOSIS — A084 Viral intestinal infection, unspecified: Secondary | ICD-10-CM | POA: Diagnosis present

## 2021-09-19 DIAGNOSIS — R0602 Shortness of breath: Secondary | ICD-10-CM | POA: Diagnosis present

## 2021-09-19 DIAGNOSIS — A419 Sepsis, unspecified organism: Secondary | ICD-10-CM | POA: Diagnosis present

## 2021-09-19 LAB — COMPREHENSIVE METABOLIC PANEL
ALT: 157 U/L — ABNORMAL HIGH (ref 0–44)
AST: 39 U/L (ref 15–41)
Albumin: 3.2 g/dL — ABNORMAL LOW (ref 3.5–5.0)
Alkaline Phosphatase: 72 U/L (ref 38–126)
Anion gap: 9 (ref 5–15)
BUN: 67 mg/dL — ABNORMAL HIGH (ref 8–23)
CO2: 22 mmol/L (ref 22–32)
Calcium: 9.2 mg/dL (ref 8.9–10.3)
Chloride: 105 mmol/L (ref 98–111)
Creatinine, Ser: 1.1 mg/dL — ABNORMAL HIGH (ref 0.44–1.00)
GFR, Estimated: 46 mL/min — ABNORMAL LOW (ref >60)
Glucose, Bld: 180 mg/dL — ABNORMAL HIGH (ref 70–99)
Potassium: 3.1 mmol/L — ABNORMAL LOW (ref 3.5–5.1)
Sodium: 136 mmol/L (ref 135–145)
Total Bilirubin: 0.9 mg/dL (ref 0.3–1.2)
Total Protein: 6 g/dL — ABNORMAL LOW (ref 6.5–8.1)

## 2021-09-19 LAB — CBC WITH DIFFERENTIAL/PLATELET
Abs Immature Granulocytes: 0.04 10*3/uL (ref 0.00–0.07)
Basophils Absolute: 0.1 10*3/uL (ref 0.0–0.1)
Basophils Relative: 1 %
Eosinophils Absolute: 0 10*3/uL (ref 0.0–0.5)
Eosinophils Relative: 0 %
HCT: 37.4 % (ref 36.0–46.0)
Hemoglobin: 12.3 g/dL (ref 12.0–15.0)
Immature Granulocytes: 0 %
Lymphocytes Relative: 4 %
Lymphs Abs: 0.5 10*3/uL — ABNORMAL LOW (ref 0.7–4.0)
MCH: 28.5 pg (ref 26.0–34.0)
MCHC: 32.9 g/dL (ref 30.0–36.0)
MCV: 86.8 fL (ref 80.0–100.0)
Monocytes Absolute: 0.5 10*3/uL (ref 0.1–1.0)
Monocytes Relative: 4 %
Neutro Abs: 10 10*3/uL — ABNORMAL HIGH (ref 1.7–7.7)
Neutrophils Relative %: 91 %
Platelets: 141 10*3/uL — ABNORMAL LOW (ref 150–400)
RBC: 4.31 MIL/uL (ref 3.87–5.11)
RDW: 15 % (ref 11.5–15.5)
Smear Review: NORMAL
WBC: 11 10*3/uL — ABNORMAL HIGH (ref 4.0–10.5)
nRBC: 0 % (ref 0.0–0.2)

## 2021-09-19 LAB — BRAIN NATRIURETIC PEPTIDE: B Natriuretic Peptide: 97.2 pg/mL (ref 0.0–100.0)

## 2021-09-19 LAB — LACTIC ACID, PLASMA
Lactic Acid, Venous: 2.4 mmol/L (ref 0.5–1.9)
Lactic Acid, Venous: 1.4 mmol/L (ref 0.5–1.9)

## 2021-09-19 LAB — TROPONIN I (HIGH SENSITIVITY)
Troponin I (High Sensitivity): 15 ng/L (ref <18)
Troponin I (High Sensitivity): 14 ng/L (ref <18)

## 2021-09-19 LAB — MAGNESIUM: Magnesium: 2.3 mg/dL (ref 1.7–2.4)

## 2021-09-19 LAB — RESP PANEL BY RT-PCR (FLU A&B, COVID) ARPGX2
Influenza A by PCR: NEGATIVE
Influenza B by PCR: NEGATIVE
SARS Coronavirus 2 by RT PCR: NEGATIVE

## 2021-09-19 MED ORDER — SODIUM CHLORIDE 0.9 % IV SOLN
2.0000 g | INTRAVENOUS | Status: DC
  Administered 2021-09-19: 2 g via INTRAVENOUS
  Filled 2021-09-19: qty 20

## 2021-09-19 MED ORDER — ENOXAPARIN SODIUM 30 MG/0.3ML IJ SOSY
30.0000 mg | PREFILLED_SYRINGE | INTRAMUSCULAR | Status: DC
  Administered 2021-09-19: 30 mg via SUBCUTANEOUS
  Filled 2021-09-19: qty 0.3

## 2021-09-19 MED ORDER — SODIUM CHLORIDE 0.9 % IV SOLN: INTRAVENOUS | Status: DC

## 2021-09-19 MED ORDER — ONDANSETRON HCL 4 MG/2ML IJ SOLN: 4.0000 mg | Freq: Four times a day (QID) | INTRAMUSCULAR | Status: DC | PRN

## 2021-09-19 MED ORDER — ONDANSETRON HCL 4 MG PO TABS: 4.0000 mg | ORAL_TABLET | Freq: Four times a day (QID) | ORAL | Status: DC | PRN

## 2021-09-19 MED ORDER — PANTOPRAZOLE SODIUM 40 MG IV SOLR
40.0000 mg | INTRAVENOUS | Status: DC
Start: 2021-09-19 — End: 2021-09-21
  Administered 2021-09-19 – 2021-09-20 (×2): 40 mg via INTRAVENOUS
  Filled 2021-09-19 (×2): qty 10

## 2021-09-19 MED ORDER — SODIUM CHLORIDE 0.9 % IV BOLUS (SEPSIS)
1000.0000 mL | Freq: Once | INTRAVENOUS | Status: AC
  Administered 2021-09-19: 1000 mL via INTRAVENOUS

## 2021-09-19 MED ORDER — SODIUM CHLORIDE 0.9 % IV SOLN
3.0000 g | Freq: Two times a day (BID) | INTRAVENOUS | Status: DC
  Administered 2021-09-20: 3 g via INTRAVENOUS
  Filled 2021-09-19 (×2): qty 8

## 2021-09-19 MED ORDER — IOHEXOL 300 MG/ML  SOLN
100.0000 mL | Freq: Once | INTRAMUSCULAR | Status: AC | PRN
  Administered 2021-09-19: 100 mL via INTRAVENOUS

## 2021-09-19 MED ORDER — SODIUM CHLORIDE 0.9 % IV SOLN
500.0000 mg | INTRAVENOUS | Status: DC
  Administered 2021-09-19: 500 mg via INTRAVENOUS
  Filled 2021-09-19: qty 5

## 2021-09-19 MED ORDER — ACETAMINOPHEN 325 MG PO TABS
650.0000 mg | ORAL_TABLET | Freq: Once | ORAL | Status: AC
  Administered 2021-09-19: 650 mg via ORAL
  Filled 2021-09-19: qty 2

## 2021-09-19 MED ORDER — LACTATED RINGERS IV SOLN
INTRAVENOUS | Status: AC
  Filled 2021-09-19 (×2): qty 1000

## 2021-09-19 NOTE — Assessment & Plan Note (Signed)
Secondary to GI losses from nausea, vomiting and poor oral intake ?Serum creatinine appears to be at baseline but BUN is markedly elevated at 67 ?Continue IV fluid hydration ?Repeat renal parameters in a.m. ?

## 2021-09-19 NOTE — Progress Notes (Signed)
Notified bedside nurse of need to draw repeat lactic acid. 

## 2021-09-19 NOTE — ED Notes (Signed)
Pt had a soft dark bowel movement at this time and was cleaned up with sheets changed. Pt was very cooperative and family member stated she did not wish to do the enema at this time. ?

## 2021-09-19 NOTE — ED Provider Notes (Signed)
? ?[facility] ?Provider Note ? ? ? Event Date/Time  ? First MD Initiated Contact with Patient 09/19/21 1114   ?  (approximate) ? ? ?History  ? ?Shortness of Breath ? ? ?HPI ? ?Faith Vasquez is a 88 y.o. female with history of diverticulosis, no heart or kidney disease, no liver disease, presents emergency department via EMS from [facility]  Patient has been complaining of shortness of breath and has new abdominal distention today.  Family member states that she had slurred speech the other day almost like she was drunk.  States that she has not had a fever, chills, cough or congestion.  Unsure if they have a DNR for the patient.  Patient was noted to be hypoxic on arrival of EMS, they states she was at 86% on room air, placed her on 4 L here in the ED.  Patient has never had to use oxygen before.  Daughter-in-law states that the patient is usually very active, walks with a walker. ? ?  ? ? ?Physical Exam  ? ?Triage Vital Signs: ?ED Triage Vitals  ?Enc Vitals Group  ?   BP 09/19/21 1101 133/75  ?   Pulse Rate 09/19/21 1101 99  ?   Resp 09/19/21 1101 (!) 26  ?   Temp 09/19/21 1101 (!) 97.5 ?F (36.4 ?C)  ?   Temp Source 09/19/21 1101 Oral  ?   SpO2 09/19/21 1101 95 %  ?   Weight 09/19/21 1106 130 lb (59 kg)  ?   Height 09/19/21 1106 5\' 5"  (1.651 m)  ?   Head Circumference --   ?   Peak Flow --   ?   Pain Score 09/19/21 1105 0  ?   Pain Loc --   ?   Pain Edu? --   ?   Excl. in GC? --   ? ? ?Most recent vital signs: ?Vitals:  ? 09/19/21 1200 09/19/21 1221  ?BP: 140/66   ?Pulse: 98 95  ?Resp: 20 18  ?Temp:    ?SpO2: 97% 93%  ? ? ? ?General: Awake, no distress.   ?CV:  Good peripheral perfusion. regular rate and  rhythm ?Resp:  Increased respiratory effort ?Abd:  Positive abdominal distention ?Other:    ? ? ?ED Results / Procedures / Treatments  ? ?Labs ?(all labs ordered are listed, but only abnormal results are displayed) ?Labs Reviewed  ?COMPREHENSIVE METABOLIC PANEL - Abnormal; Notable for  the following components:  ?    Result Value  ? Potassium 3.1 (*)   ? Glucose, Bld 180 (*)   ? BUN 67 (*)   ? Creatinine, Ser 1.10 (*)   ? Total Protein 6.0 (*)   ? Albumin 3.2 (*)   ? ALT 157 (*)   ? GFR, Estimated 46 (*)   ? All other components within normal limits  ?LACTIC ACID, PLASMA - Abnormal; Notable for the following components:  ? Lactic Acid, Venous 2.4 (*)   ? All other components within normal limits  ?CBC WITH DIFFERENTIAL/PLATELET - Abnormal; Notable for the following components:  ? WBC 11.0 (*)   ? Platelets 141 (*)   ? Neutro Abs 10.0 (*)   ? Lymphs Abs 0.5 (*)   ? All other components within normal limits  ?CULTURE, BLOOD (SINGLE)  ?RESP PANEL BY RT-PCR (FLU A&B, COVID) [id number]  ?BRAIN NATRIURETIC PEPTIDE  ?LACTIC ACID, PLASMA  ?URINALYSIS, ROUTINE W REFLEX MICROSCOPIC  ?TROPONIN I (HIGH SENSITIVITY)  ?TROPONIN I (HIGH SENSITIVITY)  ? ? ? ?  EKG ? ?EKG ? ? ?RADIOLOGY ?Chest x-ray ?CT of the head without contrast, CT chest abdomen pelvis with contrast ? ? ? ?PROCEDURES: ? ? ?Marland Kitchen.Critical Care E&M ?Performed by: Faythe GheeFisher, Keyen Marban W, PA-C ? ?Critical care provider statement:  ?  Critical care time (minutes):  75 ?  Critical care time was exclusive of:  Separately billable procedures and treating other patients ?  Critical care was necessary to treat or prevent imminent or life-threatening deterioration of the following conditions:  Respiratory failure and sepsis ?  Critical care was time spent personally by me on the following activities:  Blood draw for specimens, development of treatment plan with patient or surrogate, evaluation of patient's response to treatment, examination of patient, ordering and performing treatments and interventions, ordering and review of laboratory studies, ordering and review of radiographic studies, pulse oximetry, re-evaluation of patient's condition and review of old charts ?After initial E/M assessment, critical care services were subsequently performed that were exclusive of  separately billable procedures or treatment.  ? ? ? ?MEDICATIONS ORDERED IN ED: ?Medications  ?0.9 %  sodium chloride infusion ( Intravenous New Bag/Given 09/19/21 1250)  ?cefTRIAXone (ROCEPHIN) 2 g in sodium chloride 0.9 % 100 mL IVPB (2 g Intravenous New Bag/Given 09/19/21 1256)  ?azithromycin (ZITHROMAX) 500 mg in sodium chloride 0.9 % 250 mL IVPB (500 mg Intravenous New Bag/Given 09/19/21 1317)  ?sodium chloride 0.9 % bolus 1,000 mL (1,000 mLs Intravenous New Bag/Given 09/19/21 1250)  ?  And  ?sodium chloride 0.9 % bolus 1,000 mL (1,000 mLs Intravenous New Bag/Given 09/19/21 1248)  ?iohexol (OMNIPAQUE) 300 MG/ML solution 100 mL (100 mLs Intravenous Contrast Given 09/19/21 1231)  ? ? ? ?IMPRESSION / MDM / ASSESSMENT AND PLAN / ED COURSE  ?I reviewed the triage vital signs and the nursing notes. ?             ?               ? ?Differential diagnosis includes, but is not limited to, acute respiratory distress, CAP, COVID, liver failure with ascites, bowel obstruction, AAA, CHF ? ?Patient will remain on 4 L O2.  She is in respiratory distress with elevated pulse rate, elevated heart rate, blood pressure is slightly elevated at 141/76, will also keep her on the cardiac monitor. ? ?Labs are concerning for sepsis, code sepsis called  ?WBC is elevated at a 11.0, platelets are decreased at 141, lactic acid is 2.4 which is elevated concerns for sepsis, comprehensive metabolic panel shows potassium of 3.1, glucose 180, BUN and creatinine are also elevated along with ALT of 157, her GFR is decreased at 46, troponin is normal which is reassuring. Bnp is normal indicating low suspicion for CHF ? ?CT of the head was independently reviewed by me did not see any acute abnormality.  Confirmed by radiology ? ?We did review the old charts and patient does have multiple CTs with contrast in the past 2 years.  Do feel that we can add contrast to her CT chest abdomen pelvis without any concerns. ? ?CT chest abdomen pelvis with IV contrast  does not show bowel obstruction, does show some dilated loops with air-fluid levels, concerns for pneumonia in the right lower lung, this was independently reviewed by me and confirmed by radiology. ? ?Critical care was performed.  Patient is in respiratory distress on arrival.  We will be admitting for pneumonia, sepsis and. respiratory distress. ? ?Shared visit with Dr. Scotty CourtStafford. ? ?Did speak with hospitalist.  Dr.Agbata  is admitting.  Would like for [place] to insert NG tube for abdominal distention to decompress.  Dr. Scotty Court has discussed CT results with the family.  Patient appears to be in stable condition at this time. ? ? ?  ? ? ?FINAL CLINICAL IMPRESSION(S) / ED DIAGNOSES  ? ?Final diagnoses:  ?Acute respiratory distress  ?Acute respiratory failure with hypoxia (HCC)  ?Acute sepsis (HCC)  ? ? ? ?Rx / DC Orders  ? ?ED Discharge Orders   ? ? None  ? ?  ? ? ? ?Note:  This document was prepared using Dragon voice recognition software and may include unintentional dictation errors. ? ?  ?Faythe Ghee, PA-C ?09/19/21 1344 ? ?  ?Sharman Cheek, MD ?09/19/21 1403 ? ?

## 2021-09-19 NOTE — Consult Note (Signed)
CODE SEPSIS - PHARMACY COMMUNICATION ? ?**Broad Spectrum Antibiotics should be administered within 1 hour of Sepsis diagnosis** ? ?Time Code Sepsis Called/Page Received: 1212 ? ?Antibiotics Ordered: rocephin, azithromycin ? ?Time of 1st antibiotic administration: 1256 ? ?Additional action taken by pharmacy: none ? ?If necessary, Name of Provider/Nurse Contacted: n/a ? ? ? ?Bettey Costa ,PharmD ?Clinical Pharmacist  ?09/19/2021  2:16 PM ? ?

## 2021-09-19 NOTE — ED Notes (Signed)
Portable Xray at bedside.

## 2021-09-19 NOTE — Consult Note (Signed)
?Date of Consultation:  09/19/2021 ? ?Requesting Physician:  Collier Bullock, MD ? ?Reason for Consultation:  Gastric distention ? ?History of Present Illness: ?Faith Vasquez is a 88 y.o. female presenting with nausea, vomiting, and shortness of breath.  She has dementia and her daughter provides the medical history.  The patient lives at an assisted living facility.  Per her daughter, about two days ago there was an issue with the patient having slurred speech.  Yesterday the patient had nausea and vomiting and has had multiple episodes of this.  Her abdomen today appeared to be very distended and she's also had worsening shortness of breath.  She was brought in by EMS.  She's had a CT of chest, abdomen, and pelvis, including head CT.  Head CT was negative for acute pathology, and CT chest showed possible right sided pneumonia, and abdomen/pelvis CT showed a distended stomach with fluid tracking up to the thoracic esophagus, but without any distended small bowel or colon to suggest bowel obstruction.  She does have a surgical history of cholecystectomy, appendectomy, and hysterectomy.  Labwork showed a WBC of 11.7, total bilirubin 0.9, AST 39, ALT 157, alk phos 72, creatinine 1.1, and lactic acid of 2.4 which resolved down to 1.4 with some IV fluids. ? ?On my evaluation, the patient's RN says that her abdomen distention has improved and she has had a lot of flatus and only a small bowel movement.  No further vomiting. ? ?Past Medical History: ?Past Medical History:  ?Diagnosis Date  ? Diverticulosis   ? History of Clostridium difficile infection   ? uncertain dates, possibly in 2017 or 2018  ?  ? ?Past Surgical History: ?Past Surgical History:  ?Procedure Laterality Date  ? ABDOMINAL HYSTERECTOMY    ? CHOLECYSTECTOMY    ? HIP ARTHROPLASTY Left 03/25/2020  ? Procedure: ARTHROPLASTY BIPOLAR HIP (HEMIARTHROPLASTY);  Surgeon: Hessie Knows, MD;  Location: [facility] ORS;  Service: Orthopedics;  Laterality: Left;   ? ? ?Home Medications: ?Prior to Admission medications   ?Medication Sig Start Date End Date Taking? Authorizing Provider  ?acetaminophen (TYLENOL) 325 MG tablet Take 2 tablets (650 mg total) by mouth every 6 (six) hours. ?Patient taking differently: Take 325 mg by mouth 3 (three) times daily. 03/27/20  Yes Duanne Guess, PA-C  ?ALPRAZolam (XANAX) 0.25 MG tablet Take 0.25 mg by mouth 2 (two) times daily as needed for anxiety.   Yes [provider]  ?amLODipine (NORVASC) 5 MG tablet Take 5 mg by mouth daily. 08/28/21  Yes [provider]  ?aspirin 81 MG EC tablet Chew 81 mg by mouth daily.   Yes [provider]  ?atorvastatin (LIPITOR) 20 MG tablet Take 20 mg by mouth daily. 03/15/20  Yes [provider]  ?Cholecalciferol (VITAMIN D3) 50 MCG (2000 UT) TABS Take 1 capsule by mouth daily. Wednesday 03/22/20  Yes [provider]  ?guaifenesin (HUMIBID E) 400 MG TABS tablet Take 400 mg by mouth every 4 (four) hours as needed.   Yes [provider]  ?ondansetron (ZOFRAN) 4 MG tablet Take 4 mg by mouth every 6 (six) hours as needed for nausea or vomiting.   Yes [provider]  ?polyethylene glycol (MIRALAX / GLYCOLAX) 17 g packet Take 17 g by mouth 2 (two) times daily. ?Patient taking differently: Take 17 g by mouth 3 (three) times a week. Monday, Wednesday, and Friday. 03/27/20  Yes Enzo Bi, MD  ?sennosides-docusate sodium (SENOKOT-S) 8.6-50 MG tablet Take 2 tablets by mouth 2 (two) times  daily.   Yes [provider]  ?feeding supplement (ENSURE ENLIVE / ENSURE PLUS) LIQD Take 237 mLs by mouth 2 (two) times daily between meals. 03/27/20   Enzo Bi, MD  ? ? ?Allergies: ?Allergies  ?Allergen Reactions  ? Codeine   ? Iodine   ? Macrobid [Nitrofurantoin] Other (See Comments)  ?  Unknown reaction.  ? Ciprofloxacin Rash  ?  Other reaction(s): whelps  ? ? ?Social History: ? reports that she has never smoked. She has never used smokeless tobacco. She  reports that she does not drink alcohol and does not use drugs.  ? ?Family History: ?No family history on file. ? ?Review of Systems: ?Review of Systems  ?Unable to perform ROS: Dementia  ? ?Physical Exam ?BP 111/60   Pulse 87   Temp (!) 97.5 ?F (36.4 ?C) (Oral)   Resp (!) 23   Ht 5' 5"  (1.651 m)   Wt 59 kg   SpO2 99%   BMI 21.63 kg/m?  ?CONSTITUTIONAL: No acute distress ?HEENT:  Normocephalic, atraumatic, extraocular motion intact. ?NECK: Trachea is midline, and there is no jugular venous distension. ?RESPIRATORY:  Normal respiratory effort without pathologic use of accessory muscles.  Her voice sounds mildly aphonic ?CARDIOVASCULAR: Regular rhythm and rate. ?GI: The abdomen is soft, distended, nontender to palpation.  Has well-healed scars from her surgeries with no evidence of hernia.  ?MUSCULOSKELETAL:  Normal muscle strength and tone in all four extremities.  Does have some mild bruising in bilateral lower extremities from a recent fall.  No peripheral edema or cyanosis. ?SKIN: Skin turgor is normal. There are no pathologic skin lesions.  ?NEUROLOGIC:  Motor and sensation is grossly normal.  Cranial nerves are grossly intact. ?PSYCH:  Alert and oriented to person, place and time. Affect is normal. ? ?Laboratory Analysis: ?Results for orders placed or performed during the hospital encounter of 09/19/21 (from the past 24 hour(s))  ?Comprehensive metabolic panel     Status: Abnormal  ? Collection Time: 09/19/21 11:21 AM  ?Result Value Ref Range  ? Sodium 136 135 - 145 mmol/L  ? Potassium 3.1 (L) 3.5 - 5.1 mmol/L  ? Chloride 105 98 - 111 mmol/L  ? CO2 22 22 - 32 mmol/L  ? Glucose, Bld 180 (H) 70 - 99 mg/dL  ? BUN 67 (H) 8 - 23 mg/dL  ? Creatinine, Ser 1.10 (H) 0.44 - 1.00 mg/dL  ? Calcium 9.2 8.9 - 10.3 mg/dL  ? Total Protein 6.0 (L) 6.5 - 8.1 g/dL  ? Albumin 3.2 (L) 3.5 - 5.0 g/dL  ? AST 39 15 - 41 U/L  ? ALT 157 (H) 0 - 44 U/L  ? Alkaline Phosphatase 72 38 - 126 U/L  ? Total Bilirubin 0.9 0.3 - 1.2 mg/dL   ? GFR, Estimated 46 (L) >60 mL/min  ? Anion gap 9 5 - 15  ?Brain natriuretic peptide     Status: None  ? Collection Time: 09/19/21 11:21 AM  ?Result Value Ref Range  ? B Natriuretic Peptide 97.2 0.0 - 100.0 pg/mL  ?Lactic acid, plasma     Status: Abnormal  ? Collection Time: 09/19/21 11:21 AM  ?Result Value Ref Range  ? Lactic Acid, Venous 2.4 (HH) 0.5 - 1.9 mmol/L  ?Troponin I (High Sensitivity)     Status: None  ? Collection Time: 09/19/21 11:21 AM  ?Result Value Ref Range  ? Troponin I (High Sensitivity) 15 <18 ng/L  ?CBC with Differential     Status: Abnormal  ? Collection  Time: 09/19/21 11:21 AM  ?Result Value Ref Range  ? WBC 11.0 (H) 4.0 - 10.5 K/uL  ? RBC 4.31 3.87 - 5.11 MIL/uL  ? Hemoglobin 12.3 12.0 - 15.0 g/dL  ? HCT 37.4 36.0 - 46.0 %  ? MCV 86.8 80.0 - 100.0 fL  ? MCH 28.5 26.0 - 34.0 pg  ? MCHC 32.9 30.0 - 36.0 g/dL  ? RDW 15.0 11.5 - 15.5 %  ? Platelets 141 (L) 150 - 400 K/uL  ? nRBC 0.0 0.0 - 0.2 %  ? Neutrophils Relative % 91 %  ? Neutro Abs 10.0 (H) 1.7 - 7.7 K/uL  ? Lymphocytes Relative 4 %  ? Lymphs Abs 0.5 (L) 0.7 - 4.0 K/uL  ? Monocytes Relative 4 %  ? Monocytes Absolute 0.5 0.1 - 1.0 K/uL  ? Eosinophils Relative 0 %  ? Eosinophils Absolute 0.0 0.0 - 0.5 K/uL  ? Basophils Relative 1 %  ? Basophils Absolute 0.1 0.0 - 0.1 K/uL  ? WBC Morphology MILD LEFT SHIFT (1-5% METAS, OCC MYELO, OCC BANDS)   ? RBC Morphology MORPHOLOGY UNREMARKABLE   ? Smear Review Normal platelet morphology   ? Immature Granulocytes 0 %  ? Abs Immature Granulocytes 0.04 0.00 - 0.07 K/uL  ?Magnesium     Status: None  ? Collection Time: 09/19/21 11:21 AM  ?Result Value Ref Range  ? Magnesium 2.3 1.7 - 2.4 mg/dL  ?Resp Panel by RT-PCR (Flu A&B, Covid) Nasopharyngeal Swab     Status: None  ? Collection Time: 09/19/21  1:00 PM  ? Specimen: Nasopharyngeal Swab; Nasopharyngeal(NP) swabs in vial transport medium  ?Result Value Ref Range  ? SARS Coronavirus 2 by RT PCR NEGATIVE NEGATIVE  ? Influenza A by PCR NEGATIVE NEGATIVE  ?  Influenza B by PCR NEGATIVE NEGATIVE  ?Troponin I (High Sensitivity)     Status: None  ? Collection Time: 09/19/21  1:00 PM  ?Result Value Ref Range  ? Troponin I (High Sensitivity) 14 <18 ng/L  ?Lactic acid, plasma     St

## 2021-09-19 NOTE — ED Notes (Signed)
Secure chat sent to Dr. Joylene Igo regarding orders for NGT and patient family members at bedside not feeling like the patient will tolerate well.  ?

## 2021-09-19 NOTE — ED Notes (Signed)
Pt transported to CT via stretcher at this time.  

## 2021-09-19 NOTE — Assessment & Plan Note (Signed)
Patient presents for evaluation of shortness of breath and noted to have room air pulse oximetry of 86% at rest.  She is also tachypneic ?She is currently on 4 L of oxygen with pulse oximetry of 92% ?Acute respiratory failure appears to be secondary to multifocal pneumonia ?We will attempt to wean patient off oxygen once acute illness improves ?

## 2021-09-19 NOTE — H&P (Signed)
?History and Physical  ? ? ?Patient: Faith Vasquez [id number] DOB: [birth year] ?DOA: 09/19/2021 ?DOS: the patient was seen and examined on 09/19/2021 ?PCP: Verl Blalock, NP  ?Patient coming from: ALF/ILF ? ?Chief Complaint:  ?Chief Complaint  ?Patient presents with  ? Shortness of Breath  ? ?HPI: Faith Vasquez is a 88 y.o. female with medical history significant for diverticulosis who presents to the ER from the assisted living facility where she resides for evaluation of hypoxia and shortness of breath.  She had room air pulse oximetry of about 86% per the staff at the assisted living facility.  She is currently on 4 L of oxygen with improvement in her pulse oximetry to 92%.  Patient also states has been sick for about a week and has had nausea and multiple episodes of emesis.  She has had poor oral intake secondary to same.  On the day of admission she was noted to have abdominal distention but denies having any abdominal pain.  She had a bowel movement 1 day prior to admission.  While in the ER she has said several times she needed to have a bowel movement but has been unable to. ?She appears very uncomfortable and cannot stay still in bed. ?I am unable to do a review of systems due to her overall condition. ? ?Review of Systems: unable to review all systems due to the inability of the patient to answer questions. ?Past Medical History:  ?Diagnosis Date  ? Diverticulosis   ? History of Clostridium difficile infection   ? uncertain dates, possibly in 2017 or 2018  ? ?Past Surgical History:  ?Procedure Laterality Date  ? ABDOMINAL HYSTERECTOMY    ? CHOLECYSTECTOMY    ? HIP ARTHROPLASTY Left 03/25/2020  ? Procedure: ARTHROPLASTY BIPOLAR HIP (HEMIARTHROPLASTY);  Surgeon: Hessie Knows, MD;  Location: [facility] ORS;  Service: Orthopedics;  Laterality: Left;  ? ?Social History:  reports that she has never smoked. She has never used smokeless tobacco. She reports that she does not drink alcohol and does not use  drugs. ? ?Allergies  ?Allergen Reactions  ? Codeine   ? Iodine   ? Ciprofloxacin Rash  ?  Other reaction(s): whelps  ? ? ?No family history on file. ? ?Prior to Admission medications   ?Medication Sig Start Date End Date Taking? Authorizing Provider  ?acetaminophen (TYLENOL) 325 MG tablet Take 2 tablets (650 mg total) by mouth every 6 (six) hours. 03/27/20   Duanne Guess, PA-C  ?aspirin 81 MG chewable tablet Chew 81 mg by mouth daily.    [provider]  ?atorvastatin (LIPITOR) 20 MG tablet Take 20 mg by mouth at bedtime. 03/15/20   [provider]  ?cephALEXin (KEFLEX) 500 MG capsule Take 1 capsule (500 mg total) by mouth 2 (two) times daily. 12/13/20   Carrie Mew, MD  ?Cholecalciferol (VITAMIN D3) 1.25 MG (50000 UT) CAPS Take 1 capsule by mouth once a week. Wednesday 03/22/20   [provider]  ?feeding supplement (ENSURE ENLIVE / ENSURE PLUS) LIQD Take 237 mLs by mouth 2 (two) times daily between meals. 03/27/20   Enzo Bi, MD  ?polyethylene glycol (MIRALAX / GLYCOLAX) 17 g packet Take 17 g by mouth 2 (two) times daily. 03/27/20   Enzo Bi, MD  ? ? ?Physical Exam: ?Vitals:  ? 09/19/21 1134 09/19/21 1200 09/19/21 1221 09/19/21 1333  ?BP: (!) 141/76 140/66  (!) 145/97  ?Pulse: (!) 101 98 95 96  ?Resp: 20 20 18 20   ?Temp:      ?  TempSrc:      ?SpO2: 98% 97% 93% 96%  ?Weight:      ?Height:      ? ?Physical Exam ?Vitals and nursing note reviewed.  ?Constitutional:   ?   Comments: Noted to be very uncomfortable  ?HENT:  ?   Head: Normocephalic and atraumatic.  ?   Mouth/Throat:  ?   Comments: Dry mucous membranes ?Eyes:  ?   Pupils: Pupils are equal, round, and reactive to light.  ?Cardiovascular:  ?   Rate and Rhythm: Tachycardia present.  ?Pulmonary:  ?   Effort: Pulmonary effort is normal.  ?   Breath sounds: Examination of the right-lower field reveals rhonchi. Examination of the left-lower field reveals rhonchi. Rhonchi present.  ?Abdominal:  ?   Comments: Distended.  Hypoactive  bowel sounds.  Nontender  ?Musculoskeletal:     ?   General: Normal range of motion.  ?   Cervical back: Normal range of motion and neck supple.  ?Skin: ?   General: Skin is warm and dry.  ?Neurological:  ?   Mental Status: She is alert.  ?   Motor: Weakness present.  ?Psychiatric:     ?   Mood and Affect: Mood is anxious.  ? ? ?Data Reviewed: ?Relevant notes from primary care and specialist visits, past discharge summaries as available in EHR, including Care Everywhere. ?Prior diagnostic testing as pertinent to current admission diagnoses ?Updated medications and problem lists for reconciliation ?ED course, including vitals, labs, imaging, treatment and response to treatment ?Triage notes, nursing and pharmacy notes and ED provider's notes ?Notable results as noted in HPI ?Labs reviewed.  White count 11.0, hemoglobin 12.3, hematocrit 37.4, MCV 86.8, RDW 15, platelet count 141, lactic acid 2.4, sodium 136, potassium 3.1, chloride 105, bicarb 22, glucose 180, BUN 67, creatinine 1.10, total protein 6.0, albumin 3.2, AST 39, ALT 156 ?Chest x-ray reviewed by me shows decreased lung volumes with mildly increased right basilar ?heterogeneous airspace opacity and mild bilateral interstitial thickening. Findings may reflect mild interstitial pulmonary edema with possible right basilar atelectasis versus early pneumonia. ?CT scan of abdomen and pelvis shows Stomach is moderately distended with air-fluid level. Small bowel loops are not dilated. Appendix is not seen. There is no focal pericecal inflammation. Numerous diverticula are seen in the colon without signs of focal acute diverticulitis. Increased interstitial markings are seen in the right lung, more so in the right lower lobe. This may suggest multifocal pneumonia with possible underlying scarring. ?There is fluid in the lumen of thoracic esophagus. There is mild mucosal enhancement in the thoracic esophagus. Findings suggest possible gastroesophageal reflux and  reflux esophagitis. ?CT scan of the head without contrast shows No acute intracranial findings are seen in noncontrast CT brain. ?Atrophy. Small-vessel disease. Old lacunar infarct is seen in the left basal ganglia ?There are no new results to review at this time. ? ? ? ?Assessment and Plan: ?* Acute respiratory failure (HCC) ?Patient presents for evaluation of shortness of breath and noted to have room air pulse oximetry of 86% at rest.  She is also tachypneic ?She is currently on 4 L of oxygen with pulse oximetry of 92% ?Acute respiratory failure appears to be secondary to multifocal pneumonia ?We will attempt to wean patient off oxygen once acute illness improves ? ?Multifocal pneumonia ?Patient presents for evaluation of shortness of breath and imaging shows multifocal pneumonia. ?She has had multiple episodes of emesis and there is a concern for aspiration as the cause of her  multifocal pneumonia  ?We will start patient on Unasyn ?Follow-up results of blood cultures ? ?GERD (gastroesophageal reflux disease) ?Patient with a history of GERD who presents for evaluation of nausea, vomiting and abdominal distention. ?Imaging shows Stomach is moderately distended with air-fluid level. Small bowel loops are not dilated. ?We will place NG tube for gastric decompression ?IV fluid resuscitation ?Place patient on IV Protonix ?We will request surgical consult ? ?Dehydration ?Secondary to GI losses from nausea, vomiting and poor oral intake ?Serum creatinine appears to be at baseline but BUN is markedly elevated at 67 ?Continue IV fluid hydration ?Repeat renal parameters in a.m. ? ?Hypokalemia ?Secondary to GI losses from nausea and vomiting ?Supplement potassium ?Check magnesium levels ? ? ? ? ? Advance Care Planning:   Code Status: Full Code  ? ?Consults: Surgery ? ?Family Communication: Greater than 50% of time was spent discussing patient's condition and plan of care with her daughter-in-law at the bedside.  All  questions and concerns have been addressed.  She verbalizes understanding and agrees with the plan.  CODE STATUS was discussed and she is a full code ? ?Severity of Illness: ?The appropriate patient status for t

## 2021-09-19 NOTE — ED Notes (Signed)
Pt family requests for pt to have enema after she wakes up d/t pt finally resting comfortably.  ?

## 2021-09-19 NOTE — ED Triage Notes (Signed)
Pt BIB [facility] from ALF [facility]) C/O SOB. Per EMS, pt reports being "sick" for about a week, reports new abdominal distention today. Pt noted to have hoarse voice during triage. ? ? ?

## 2021-09-19 NOTE — ED Notes (Signed)
Pt resting quietly in bed with eyes closed. Respirations even and unlabored. No S/S of distress noted. Family remains at bedside. Call light within reach.  ?

## 2021-09-19 NOTE — Assessment & Plan Note (Signed)
Patient presents for evaluation of shortness of breath and imaging shows multifocal pneumonia. ?She has had multiple episodes of emesis and there is a concern for aspiration as the cause of her multifocal pneumonia  ?We will start patient on Unasyn ?Follow-up results of blood cultures ?

## 2021-09-19 NOTE — Assessment & Plan Note (Signed)
Patient with a history of GERD who presents for evaluation of nausea, vomiting and abdominal distention. ?Imaging shows Stomach is moderately distended with air-fluid level. Small bowel loops are not dilated. ?We will place NG tube for gastric decompression ?IV fluid resuscitation ?Place patient on IV Protonix ?We will request surgical consult ?

## 2021-09-19 NOTE — Progress Notes (Signed)
Elink following code sepsis °

## 2021-09-19 NOTE — Consult Note (Signed)
Pharmacy Antibiotic Note ? ?Faith Vasquez is a 88 y.o. female admitted on 09/19/2021 with  shortness of breath .  Pharmacy has been consulted for Unasyn dosing. Patient received 1 time doses of rocephin and azithromycin in ED. ? ?Plan: ?Unasyn 3g IV Q 12 hours ? ?Height: 5\' 5"  (165.1 cm) ?Weight: 59 kg (130 lb) ?IBW/kg (Calculated) : 57 ? ?Temp (24hrs), Avg:97.5 ?F (36.4 ?C), Min:97.5 ?F (36.4 ?C), Max:97.5 ?F (36.4 ?C) ? ?Recent Labs  ?Lab 09/19/21 ?1121  ?WBC 11.0*  ?CREATININE 1.10*  ?LATICACIDVEN 2.4*  ?  ?Estimated Creatinine Clearance: 26.9 mL/min (A) (by C-G formula based on SCr of 1.1 mg/dL (H)).   ? ?Allergies  ?Allergen Reactions  ? Codeine   ? Iodine   ? Ciprofloxacin Rash  ?  Other reaction(s): whelps  ? ? ?Antimicrobials this admission: ?Azithro/Rocephin 4/27 x1 ?Unasyn 4/27 >>  ? ?Dose adjustments this admission: ?N/a ? ?Microbiology results: ?4/27 BCx: sent ? ? ?Thank you for allowing pharmacy to be a part of this patient?s care. ? ?5/27 ?09/19/2021 3:10 PM ? ?

## 2021-09-19 NOTE — Assessment & Plan Note (Signed)
Secondary to GI losses from nausea and vomiting Supplement potassium Check magnesium levels 

## 2021-09-19 NOTE — ED Notes (Signed)
Pt is asleep at this time. Family does not wish to have NG tube place, x-ray taken, soap suds enema or move to C-pod due to the pt being asleep at this time and having a Hx of dementia with sundowning.  ?

## 2021-09-20 ENCOUNTER — Inpatient Hospital Stay: Payer: Medicare Other

## 2021-09-20 ENCOUNTER — Other Ambulatory Visit: Payer: Self-pay

## 2021-09-20 DIAGNOSIS — E860 Dehydration: Secondary | ICD-10-CM | POA: Diagnosis not present

## 2021-09-20 DIAGNOSIS — K219 Gastro-esophageal reflux disease without esophagitis: Secondary | ICD-10-CM | POA: Diagnosis not present

## 2021-09-20 DIAGNOSIS — J9601 Acute respiratory failure with hypoxia: Secondary | ICD-10-CM | POA: Diagnosis not present

## 2021-09-20 DIAGNOSIS — J189 Pneumonia, unspecified organism: Secondary | ICD-10-CM | POA: Diagnosis not present

## 2021-09-20 DIAGNOSIS — E876 Hypokalemia: Secondary | ICD-10-CM | POA: Diagnosis not present

## 2021-09-20 DIAGNOSIS — R140 Abdominal distension (gaseous): Secondary | ICD-10-CM | POA: Diagnosis not present

## 2021-09-20 DIAGNOSIS — J9600 Acute respiratory failure, unspecified whether with hypoxia or hypercapnia: Secondary | ICD-10-CM | POA: Diagnosis not present

## 2021-09-20 LAB — CBC
HCT: 31.2 % — ABNORMAL LOW (ref 36.0–46.0)
Hemoglobin: 10.1 g/dL — ABNORMAL LOW (ref 12.0–15.0)
MCH: 28.9 pg (ref 26.0–34.0)
MCHC: 32.4 g/dL (ref 30.0–36.0)
MCV: 89.4 fL (ref 80.0–100.0)
Platelets: 111 10*3/uL — ABNORMAL LOW (ref 150–400)
RBC: 3.49 MIL/uL — ABNORMAL LOW (ref 3.87–5.11)
RDW: 15.4 % (ref 11.5–15.5)
WBC: 8 10*3/uL (ref 4.0–10.5)
nRBC: 0 % (ref 0.0–0.2)

## 2021-09-20 LAB — URINALYSIS, ROUTINE W REFLEX MICROSCOPIC
Bilirubin Urine: NEGATIVE
Glucose, UA: NEGATIVE mg/dL
Ketones, ur: NEGATIVE mg/dL
Nitrite: POSITIVE — AB
Protein, ur: NEGATIVE mg/dL
Specific Gravity, Urine: 1.038 — ABNORMAL HIGH (ref 1.005–1.030)
pH: 5 (ref 5.0–8.0)

## 2021-09-20 LAB — BASIC METABOLIC PANEL
Anion gap: 4 — ABNORMAL LOW (ref 5–15)
BUN: 47 mg/dL — ABNORMAL HIGH (ref 8–23)
CO2: 22 mmol/L (ref 22–32)
Calcium: 8.7 mg/dL — ABNORMAL LOW (ref 8.9–10.3)
Chloride: 114 mmol/L — ABNORMAL HIGH (ref 98–111)
Creatinine, Ser: 0.94 mg/dL (ref 0.44–1.00)
GFR, Estimated: 56 mL/min — ABNORMAL LOW (ref >60)
Glucose, Bld: 89 mg/dL (ref 70–99)
Potassium: 3.4 mmol/L — ABNORMAL LOW (ref 3.5–5.1)
Sodium: 140 mmol/L (ref 135–145)

## 2021-09-20 MED ORDER — ALPRAZOLAM 0.25 MG PO TABS
0.2500 mg | ORAL_TABLET | Freq: Two times a day (BID) | ORAL | Status: DC | PRN
  Administered 2021-09-20 – 2021-09-23 (×4): 0.25 mg via ORAL
  Filled 2021-09-20 (×4): qty 1

## 2021-09-20 MED ORDER — SODIUM CHLORIDE 0.9 % IV SOLN
3.0000 g | Freq: Four times a day (QID) | INTRAVENOUS | Status: DC
  Administered 2021-09-20 – 2021-09-23 (×12): 3 g via INTRAVENOUS
  Filled 2021-09-20 (×2): qty 8
  Filled 2021-09-20: qty 3
  Filled 2021-09-20 (×4): qty 8
  Filled 2021-09-20 (×2): qty 3
  Filled 2021-09-20 (×3): qty 8
  Filled 2021-09-20: qty 3

## 2021-09-20 MED ORDER — ORAL CARE MOUTH RINSE
15.0000 mL | Freq: Two times a day (BID) | OROMUCOSAL | Status: DC
  Administered 2021-09-20 – 2021-09-23 (×5): 15 mL via OROMUCOSAL

## 2021-09-20 MED ORDER — ENOXAPARIN SODIUM 40 MG/0.4ML IJ SOSY
40.0000 mg | PREFILLED_SYRINGE | INTRAMUSCULAR | Status: DC
  Administered 2021-09-20 – 2021-09-22 (×3): 40 mg via SUBCUTANEOUS
  Filled 2021-09-20 (×3): qty 0.4

## 2021-09-20 MED ORDER — POTASSIUM CHLORIDE CRYS ER 20 MEQ PO TBCR
40.0000 meq | EXTENDED_RELEASE_TABLET | Freq: Once | ORAL | Status: AC
  Administered 2021-09-20: 40 meq via ORAL
  Filled 2021-09-20: qty 2

## 2021-09-20 NOTE — ED Notes (Signed)
Patient transported to X-ray 

## 2021-09-20 NOTE — Progress Notes (Signed)
Mobility Specialist - Progress Note ? ? 09/20/21 1600  ?Mobility  ?Activity Ambulated with assistance in hallway  ?Level of Assistance Standby assist, set-up cues, supervision of patient - no hands on  ?Assistive Device Front wheel walker  ?Distance Ambulated (ft) 90 ft  ?Activity Response Tolerated well  ?$Mobility charge 1 Mobility  ? ? ? ?Mobility responded to bed alarm. Pt lying in bed with [place] doffed, stating she wants to walk. Pt a little impulsive, but carries out single-step commands well. Cueing needed for hand placement to stand. Supervision during ambulation, denied SOB on 2L. Pt returned supine with needs in reach. Family at bedside.  ? ? ?Filiberto Pinks ?Mobility Specialist ?09/20/21, 4:52 PM ? ? ? ? ?

## 2021-09-20 NOTE — Progress Notes (Addendum)
Pt is very agiatated and wanting to get up and ahrd to re direct. Son is at bedside and requesting for xanax to help pt calm down. NP Jon Billings made aware. Will continue to monitor. ? ?Update 1932: NP Jon Billings placed order. Will continue to monitor. ?

## 2021-09-20 NOTE — Progress Notes (Signed)
?PROGRESS NOTE ? ?Faith Vasquez    DOB: 1922-10-12, 88 y.o.  ?[id number]  ?  Code Status: Full Code   ?DOA: 09/19/2021   LOS: 1  ? ?Brief hospital course  ?Faith Vasquez is a 88 y.o. female with a PMH significant for diverticulosis, HTN, advanced dementia. ? ?They presented from memory care unit at [facility] to the ED on 09/19/2021 with SOB and abdominal distention x 1 days. She was hypoxic and tachypneic on arrival requiring 4L O2. She denies urinary symptoms.  ? ?In the ED, Significant findings included multifocal pneumonia on chest CT. No evidence of intestinal obstruction. Appearance of recent compression fracture of L1. LA normal, troponin negative, respiratory panel negative, leukocytosis of 11.0.  ?Urinalysis positive for signs of infection.  ? ?They were treated with IV fluids and antibiotics. General surgery was consulted.   ?Patient was admitted to medicine service for further workup and management of acute hypoxic respiratory failure as outlined in detail below. ? ?09/20/21 -stable, improving ? ?Assessment & Plan  ?Principal Problem: ?  Acute respiratory failure ([place]) ?Active Problems: ?  Multifocal pneumonia ?  GERD (gastroesophageal reflux disease) ?  Hypokalemia ?  Dehydration ? ?Acute hypoxic respiratory failure  multifocal PNA- improving. Patient endorses improvement in her respiratory status but is still having conversational dyspnea. She states that she had no dyspnea while ambulating to the commode. O2 has decreased to 3L.  ?- continue IV Abx ?- f/u cultures ?- incentive spirometer ?- wean to room air as tolerated.  ?  ?Abdominal pain- discussed care with general surgery. Agree that is likely related to illeus and evidence of viral gastroenteritis which seems to be improving. Patient was able to tolerate liquid diet. Abdominal pain improved. Had BM today ?- anti-emetics PRN ?- strict I/O ?- advance diet as tolerated ?  ?Dehydration ?Secondary to GI losses from nausea, vomiting and poor  oral intake ?Serum creatinine appears to be at baseline but BUN is markedly elevated at 67 ?Continue IV fluid hydration ?- BMP am ?  ?Hypokalemia- improved. K+ 3.1>3.4 ?Secondary to GI losses from nausea and vomiting ?- replete PRN ?- BMP am ? ?Body mass index is 21.63 kg/m?. ? ?VTE ppx: enoxaparin (LOVENOX) injection 30 mg Start: 09/19/21 2200 ? ? ?Diet:  ?   ?Diet  ? Diet NPO time specified  ? ?Consultants: ?General surgery ?Subjective 09/20/21   ? ?Pt reports feeling much improved. She was able to ambulate to commode without dyspnea. Had normal BM.  ?  ?Objective  ? ?Vitals:  ? 09/20/21 0300 09/20/21 0600 09/20/21 0630 09/20/21 0730  ?BP: (!) 142/80 (!) 142/63 (!) 120/59 (!) 117/59  ?Pulse: 93 84 74 100  ?Resp: (!) 21 (!) 28  (!) 30  ?Temp:      ?TempSrc:      ?SpO2: 98% 96%  94%  ?Weight:      ?Height:      ? ? ?Intake/Output Summary (Last 24 hours) at 09/20/2021 0830 ?Last data filed at 09/20/2021 0057 ?Gross per 24 hour  ?Intake 3657.05 ml  ?Output --  ?Net 3657.05 ml  ? ?Filed Weights  ? 09/19/21 1106  ?Weight: 59 kg  ?  ? ?Physical Exam:  ?General: awake, alert, NAD ?Respiratory: increased respiratory effort. Speaking in short phrases. No accessory muscle use. Scattered crackles ?Cardiovascular: quick capillary refill, normal S1/S2, RRR ?Gastrointestinal: soft, NT, ND ?Nervous: A&O x3. no gross focal neurologic deficits, normal speech ?Extremities: moves all equally, no edema, normal tone ?Skin: dry, intact, normal temperature, normal  color. No rashes, lesions or ulcers on exposed skin ?Psychiatry: normal mood, congruent affect ? ?Labs   ?I have personally reviewed the following labs and imaging studies ?CBC ?   ?Component Value Date/Time  ? WBC 8.0 09/20/2021 0518  ? RBC 3.49 (L) 09/20/2021 0518  ? HGB 10.1 (L) 09/20/2021 0518  ? HCT 31.2 (L) 09/20/2021 0518  ? PLT 111 (L) 09/20/2021 0518  ? MCV 89.4 09/20/2021 0518  ? MCH 28.9 09/20/2021 0518  ? MCHC 32.4 09/20/2021 0518  ? RDW 15.4 09/20/2021 0518  ?  LYMPHSABS 0.5 (L) 09/19/2021 1121  ? MONOABS 0.5 09/19/2021 1121  ? EOSABS 0.0 09/19/2021 1121  ? BASOSABS 0.1 09/19/2021 1121  ? ? ?  Latest Ref Rng & Units 09/20/2021  ?  5:18 AM 09/19/2021  ? 11:21 AM 06/25/2021  ?  5:58 PM  ?BMP  ?Glucose 70 - 99 mg/dL 89   180   153    ?BUN 8 - 23 mg/dL 47   67   21    ?Creatinine 0.44 - 1.00 mg/dL 0.94   1.10   1.33    ?Sodium 135 - 145 mmol/L 140   136   137    ?Potassium 3.5 - 5.1 mmol/L 3.4   3.1   3.3    ?Chloride 98 - 111 mmol/L 114   105   105    ?CO2 22 - 32 mmol/L 22   22   24     ?Calcium 8.9 - 10.3 mg/dL 8.7   9.2   9.8    ? ? ?CT Head Wo Contrast ? ?Result Date: 09/19/2021 ?CLINICAL DATA:  Altered mental status EXAM: CT HEAD WITHOUT CONTRAST TECHNIQUE: Contiguous axial images were obtained from the base of the skull through the vertex without intravenous contrast. RADIATION DOSE REDUCTION: This exam was performed according to the departmental dose-optimization program which includes automated exposure control, adjustment of the mA and/or kV according to patient size and/or use of iterative reconstruction technique. COMPARISON:  08/12/2021 FINDINGS: Brain: No acute intracranial findings are seen. There are no signs of bleeding. Cortical sulci are prominent. There is decreased density in the periventricular and subcortical white matter. Lacunar infarct is seen in the left basal ganglia. Vascular: Scattered arterial calcifications are seen. Skull: Unremarkable. Sinuses/Orbits: Unremarkable. Other: None IMPRESSION: No acute intracranial findings are seen in noncontrast CT brain. Atrophy. Small-vessel disease. Old lacunar infarct is seen in the left basal ganglia. Electronically Signed   By: Elmer Picker M.D.   On: 09/19/2021 13:10  ? ?CT CHEST ABDOMEN PELVIS W CONTRAST ? ?Result Date: 09/19/2021 ?CLINICAL DATA:  Hypoxia, abdominal distention EXAM: CT CHEST, ABDOMEN, AND PELVIS WITH CONTRAST TECHNIQUE: Multidetector CT imaging of the chest, abdomen and pelvis was  performed following the standard protocol during bolus administration of intravenous contrast. RADIATION DOSE REDUCTION: This exam was performed according to the departmental dose-optimization program which includes automated exposure control, adjustment of the mA and/or kV according to patient size and/or use of iterative reconstruction technique. CONTRAST:  165mL OMNIPAQUE IOHEXOL 300 MG/ML  SOLN COMPARISON:  CT abdomen done on 12/13/2020 FINDINGS: CT CHEST FINDINGS Cardiovascular: Heart is enlarged in size. Scattered coronary artery calcifications are seen. There is homogeneous enhancement in thoracic aorta. There are no filling defects in the central pulmonary artery branches in the mediastinum. Mediastinum/Nodes: No significant lymphadenopathy is seen. There is fluid in the lumen of thoracic esophagus. There is mild mucosal enhancement in the thoracic esophagus. Lungs/Pleura: Increased interstitial markings are seen in  the right upper lobe, right middle lobe and right lower lobe. Subtle increase in interstitial markings are seen in the periphery of left lower lung fields. There is no focal consolidation. There is no pleural effusion or pneumothorax. Musculoskeletal: Bony structures in the thorax are unremarkable. CT ABDOMEN PELVIS FINDINGS Hepatobiliary: There is a 1 cm hypervascular structure in the dome of right lobe of liver. There is no dilation of bile ducts. Gallbladder is not seen. Pancreas: No focal abnormality is seen. Spleen: Unremarkable. Adrenals/Urinary Tract: Adrenals are unremarkable. There is no hydronephrosis. There are multiple cortical and parapelvic cysts in both kidneys. There are no renal or ureteral stones. Evaluation of the urinary bladder is limited by severe beam hardening artifacts. Stomach/Bowel: Small hiatal hernia is seen. Stomach is moderately distended with air-fluid level. Small bowel loops are not dilated. Appendix is not seen. There is no focal pericecal inflammation. Numerous  diverticula are seen in the colon without signs of focal acute diverticulitis. Vascular/Lymphatic: Scattered atherosclerotic plaques and calcifications are seen in the aorta and its major branches. Reproductive: Freddrick March

## 2021-09-20 NOTE — Consult Note (Signed)
Pharmacy Antibiotic Note ? ?Faith Vasquez is a 88 y.o. female admitted on 09/19/2021 with  shortness of breath .  Pharmacy has been consulted for Unasyn dosing.  ? ?Plan: ? ?Adjust Unasyn to 3 g IV q6h ?--CrCl 31.5 mL/min in chart; Scr 0.94 ?--Normalized CrCl ~ 40 mL/min ? ?Height: 5\' 5"  (165.1 cm) ?Weight: 59 kg (130 lb) ?IBW/kg (Calculated) : 57 ? ?Temp (24hrs), Avg:97.7 ?F (36.5 ?C), Min:97.6 ?F (36.4 ?C), Max:97.7 ?F (36.5 ?C) ? ?Recent Labs  ?Lab 09/19/21 ?1121 09/19/21 ?1530 09/20/21 ?0518  ?WBC 11.0*  --  8.0  ?CREATININE 1.10*  --  0.94  ?LATICACIDVEN 2.4* 1.4  --   ? ?  ?Estimated Creatinine Clearance: 31.5 mL/min (by C-G formula based on SCr of 0.94 mg/dL).   ? ?Allergies  ?Allergen Reactions  ? Codeine   ? Iodine   ? Macrobid [Nitrofurantoin] Other (See Comments)  ?  Unknown reaction.  ? Ciprofloxacin Rash  ?  Other reaction(s): whelps  ? ? ?Antimicrobials this admission: ?Azithro/Rocephin 4/27 x1 ?Unasyn 4/27 >>  ? ?Dose adjustments this admission: ?N/A ? ?Microbiology results: ?4/27 BCx: NGTD ? ?Thank you for allowing pharmacy to be a part of this patient?s care. ? ?5/27 ?09/20/2021 4:11 PM ?

## 2021-09-20 NOTE — Plan of Care (Signed)
°  Problem: Clinical Measurements: °Goal: Respiratory complications will improve °Outcome: Progressing °  °Problem: Clinical Measurements: °Goal: Cardiovascular complication will be avoided °Outcome: Progressing °  °Problem: Activity: °Goal: Risk for activity intolerance will decrease °Outcome: Progressing °  °Problem: Coping: °Goal: Level of anxiety will decrease °Outcome: Progressing °  °Problem: Elimination: °Goal: Will not experience complications related to urinary retention °Outcome: Progressing °  °Problem: Pain Managment: °Goal: General experience of comfort will improve °Outcome: Progressing °  °Problem: Safety: °Goal: Ability to remain free from injury will improve °Outcome: Progressing °  °

## 2021-09-20 NOTE — ED Notes (Signed)
Pt had a small soft bowel movement at this time. Pt cleaned and placed in a new brief at this time. ?

## 2021-09-20 NOTE — Progress Notes (Signed)
PHARMACIST - PHYSICIAN COMMUNICATION ? ?CONCERNING:  Enoxaparin (Lovenox) for DVT Prophylaxis  ? ? ?RECOMMENDATION: ?Patient was prescribed enoxaparin 30mg  q24 hours for VTE prophylaxis.  ? ?Filed Weights  ? 09/19/21 1106  ?Weight: 59 kg (130 lb)  ? ? ?Body mass index is 21.63 kg/m?. ? ?Estimated Creatinine Clearance: 31.5 mL/min (by C-G formula based on SCr of 0.94 mg/dL). ? ?Patient is candidate for enoxaparin 40mg  every 24 hours based on CrCl >43ml/min and Weight >45kg ? ?DESCRIPTION: ?Pharmacy has adjusted enoxaparin dose per [facility] policy. ? ?Patient is now receiving enoxaparin 40 mg every 24 hours  ? ?Benita Gutter ?09/20/2021 ?4:10 PM  ?

## 2021-09-20 NOTE — ED Notes (Signed)
Informed RN bed assigned 

## 2021-09-20 NOTE — ED Notes (Signed)
Pt is asleep at this time. Purewic device in place ? ?

## 2021-09-20 NOTE — ED Notes (Signed)
Pt had a small soft bowel movement at this time. ?

## 2021-09-20 NOTE — Progress Notes (Signed)
Admission profile updated. ?

## 2021-09-20 NOTE — ED Notes (Signed)
Pt cleaned and new brief applied by this Marketing executive. ?

## 2021-09-20 NOTE — Progress Notes (Signed)
[facility] ?SURGICAL PROGRESS NOTE (cpt [phone]) ? ?Hospital Day(s): 1.  ? ?Interval History: Patient seen and examined, no acute events or new complaints overnight. Patient reports she is feeling much better this morning. Very minimal abdominal soreness; improved significantly. No fever, chills, nausea, emesis. Leukocytosis resolved; now 8.0K. Renal function normal; sCr - 0.94. Mild hypokalemia to 3.1. She is NPO. She has passed flatus and had multiple BMs. KUB this morning is without gastric distension.  ? ?Review of Systems:  ?Constitutional: denies fever, chills  ?HEENT: denies cough or congestion  ?Respiratory: denies any shortness of breath  ?Cardiovascular: denies chest pain or palpitations  ?Gastrointestinal: denies abdominal pain, N/V ?Genitourinary: denies burning with urination or urinary frequency ?Musculoskeletal: denies pain, decreased motor or sensation ? ?Vital signs in last 24 hours: [min-max] current  ?Temp:  [97.5 ?F (36.4 ?C)] 97.5 ?F (36.4 ?C) (04/27 1101) ?Pulse Rate:  [74-101] 100 (04/28 0730) ?Resp:  [18-30] 30 (04/28 0730) ?BP: (111-145)/(59-97) 117/59 (04/28 0730) ?SpO2:  [86 %-100 %] 94 % (04/28 0730) ?Weight:  [59 kg] 59 kg (04/27 1106)     Height: 5\' 5"  (165.1 cm) Weight: 59 kg BMI (Calculated): 21.63  ? ?Intake/Output last 2 shifts:  ?04/27 0701 - 04/28 0700 ?In: 3657.1 [I.V.:1307.1; IV Piggyback:2350] ?Out: -   ? ?Physical Exam:  ?Constitutional: alert, cooperative and no distress  ?HENT: normocephalic without obvious abnormality  ?Eyes: PERRL, EOM's grossly intact and symmetric  ?Respiratory: breathing non-labored at rest  ?Cardiovascular: regular rate and sinus rhythm  ?Gastrointestinal: soft, non-tender, and non-distended, no rebound/guarding ?Musculoskeletal: no edema or wounds, motor and sensation grossly intact, NT  ? ? ?Labs:  ? ?  Latest Ref Rng & Units 09/20/2021  ?  5:18 AM 09/19/2021  ? 11:21 AM 06/25/2021  ?  5:58 PM  ?CBC  ?WBC 4.0 - 10.5 K/uL 8.0   11.0   7.2     ?Hemoglobin 12.0 - 15.0 g/dL 06/27/2021   48.2   50.0    ?Hematocrit 36.0 - 46.0 % 31.2   37.4   44.7    ?Platelets 150 - 400 K/uL 111   141   162    ? ? ?  Latest Ref Rng & Units 09/20/2021  ?  5:18 AM 09/19/2021  ? 11:21 AM 06/25/2021  ?  5:58 PM  ?CMP  ?Glucose 70 - 99 mg/dL 89   06/27/2021   488    ?BUN 8 - 23 mg/dL 47   67   21    ?Creatinine 0.44 - 1.00 mg/dL 891   6.94   5.03    ?Sodium 135 - 145 mmol/L 140   136   137    ?Potassium 3.5 - 5.1 mmol/L 3.4   3.1   3.3    ?Chloride 98 - 111 mmol/L 114   105   105    ?CO2 22 - 32 mmol/L 22   22   24     ?Calcium 8.9 - 10.3 mg/dL 8.7   9.2   9.8    ?Total Protein 6.5 - 8.1 g/dL  6.0     ?Total Bilirubin 0.3 - 1.2 mg/dL  0.9     ?Alkaline Phos 38 - 126 U/L  72     ?AST 15 - 41 U/L  39     ?ALT 0 - 44 U/L  157     ? ? ?Imaging studies:  ? ?KUB (09/20/2021) personally reviewed with non-specific mild gaseous distension, no gastric distension, and radiologist  reviewed:  ?IMPRESSION: ?1. No acute cardiopulmonary findings. ?2. No evidence for intraperitoneal free air. ?3. Diffuse gas-filled small bowel and colon raises the question of ?underlying ileus. ?4. Distended urinary bladder with opacified urine compatible with ?contrast infused CT yesterday. Correlation for urinary retention ?suggested. ? ? ?Assessment/Plan: (ICD-10's: K56.7) ?88 y.o. female with clinically resolved ileus likely secondary to acute medical issues (PNA, UTI), complicated by numerous medical conditions and advanced age ? ? - I think we can initiate CLD today; advance as tolerated  ? - No role for NGT ?- No role for surgical intervention  ? - Monitor abdominal examination; on-going bowel function  ? - Pain control prn (minimize narcotics); antiemetics prn ?- Further management per primary service ? ?- Nothing further from surgical perspective; ADAT. We will sign off. Please call with questions/concerns   ? ?All of the above findings and recommendations were discussed with the patient, patient's family, and the  medical team, and all of patient's and family's questions were answered to their expressed satisfaction. ? ?-- ?Lynden Oxford, PA-C ?[facility] ?09/20/2021, 8:06 AM ?M-F: 7am - 4pm ? ?

## 2021-09-20 NOTE — Progress Notes (Signed)
Cross Cover ?Patient very agitated per nurse report and son at [facility] requesting her xanax she takes at home routinely. Med ordered from home med list ?

## 2021-09-21 DIAGNOSIS — E860 Dehydration: Secondary | ICD-10-CM | POA: Diagnosis not present

## 2021-09-21 DIAGNOSIS — A419 Sepsis, unspecified organism: Secondary | ICD-10-CM

## 2021-09-21 DIAGNOSIS — K219 Gastro-esophageal reflux disease without esophagitis: Secondary | ICD-10-CM | POA: Diagnosis not present

## 2021-09-21 DIAGNOSIS — J9601 Acute respiratory failure with hypoxia: Secondary | ICD-10-CM | POA: Diagnosis not present

## 2021-09-21 LAB — CBC
HCT: 26.9 % — ABNORMAL LOW (ref 36.0–46.0)
Hemoglobin: 8.8 g/dL — ABNORMAL LOW (ref 12.0–15.0)
MCH: 28.8 pg (ref 26.0–34.0)
MCHC: 32.7 g/dL (ref 30.0–36.0)
MCV: 87.9 fL (ref 80.0–100.0)
Platelets: 124 10*3/uL — ABNORMAL LOW (ref 150–400)
RBC: 3.06 MIL/uL — ABNORMAL LOW (ref 3.87–5.11)
RDW: 14.9 % (ref 11.5–15.5)
WBC: 7.5 10*3/uL (ref 4.0–10.5)
nRBC: 0 % (ref 0.0–0.2)

## 2021-09-21 LAB — BASIC METABOLIC PANEL
Anion gap: 4 — ABNORMAL LOW (ref 5–15)
BUN: 28 mg/dL — ABNORMAL HIGH (ref 8–23)
CO2: 24 mmol/L (ref 22–32)
Calcium: 8.7 mg/dL — ABNORMAL LOW (ref 8.9–10.3)
Chloride: 113 mmol/L — ABNORMAL HIGH (ref 98–111)
Creatinine, Ser: 0.89 mg/dL (ref 0.44–1.00)
GFR, Estimated: 59 mL/min — ABNORMAL LOW (ref >60)
Glucose, Bld: 91 mg/dL (ref 70–99)
Potassium: 3.7 mmol/L (ref 3.5–5.1)
Sodium: 141 mmol/L (ref 135–145)

## 2021-09-21 NOTE — Progress Notes (Signed)
?PROGRESS NOTE ? ?Faith Vasquez    DOB: 1923-04-11, 88 y.o.  ?[id number]  ?  Code Status: Full Code   ?DOA: 09/19/2021   LOS: 2  ? ?Brief hospital course  ?Faith Vasquez is a 88 y.o. female with a PMH significant for diverticulosis, HTN, advanced dementia. ? ?They presented from memory care unit at [facility] to the ED on 09/19/2021 with SOB and abdominal distention x 1 days. She was hypoxic and tachypneic on arrival requiring 4L O2. She denies urinary symptoms.  ? ?In the ED, Significant findings included multifocal pneumonia on chest CT. No evidence of intestinal obstruction. Appearance of recent compression fracture of L1. LA normal, troponin negative, respiratory panel negative, leukocytosis of 11.0.  ?Urinalysis positive for signs of infection.  ? ?They were treated with IV fluids and antibiotics. General surgery was consulted.   ?Patient was admitted to medicine service for further workup and management of acute hypoxic respiratory failure as outlined in detail below. ? ?09/21/21 -stable, improving ? ?Assessment & Plan  ?Principal Problem: ?  Acute respiratory failure (HCC) ?Active Problems: ?  Multifocal pneumonia ?  GERD (gastroesophageal reflux disease) ?  Hypokalemia ?  Dehydration ? ?Acute hypoxic respiratory failure  multifocal PNA- improving. Patient denies respiratory complaints and is able to speak in full sentences without dyspnea. O2 has decreased to 2L.  ?- continue IV Abx ?- f/u cultures ?- incentive spirometer, flutter valve ?- wean to room air as tolerated.  ?  ?Abdominal pain- discussed care with general surgery. Agree that is likely related to illeus and evidence of viral gastroenteritis which seems to be improving/resolving. Patient was able to tolerate liquid diet. Abdominal pain improved. Having regular Bms. Now on regular diet ?- anti-emetics PRN ?- strict I/O ?- advanced diet and patient tolerating at breakfast time. ?  ?Dehydration- Secondary to GI losses from nausea, vomiting and  poor oral intake ?Cr returned to normal baseline ?- discontinue IV fluid hydration now that she's taking good PO ?- BMP am ?  ?Hypokalemia- resolved K+ 3.1>3.4>3.7 ?Secondary to GI losses from nausea and vomiting ?- replete PRN ?- BMP am ? ?Thrombocytopenia  anemia- mild and chronic. No signs of active bleeding. Platelets 111>124. Hgb 10.1>8.8, likely dilutional ?- CBC am ? ?Body mass index is 21.63 kg/m?. ? ?VTE ppx: enoxaparin (LOVENOX) injection 40 mg Start: 09/20/21 2200 ? ? ?Diet:  ?   ?Diet  ? Diet regular Room service appropriate? Yes; Fluid consistency: Thin  ? ?Consultants: ?General surgery ?Subjective 09/21/21   ? ?Pt reports feeling much better. She is oriented to self but confused on where she is this morning. She denies any complaints of pain, CP, SOB, abdominal pain.  ?  ?Objective  ? ?Vitals:  ? 09/20/21 1837 09/21/21 0000 09/21/21 0451 09/21/21 0756  ?BP: (!) 150/68 112/65 (!) 110/50 (!) 103/49  ?Pulse: 89   72  ?Resp: 18 20 20 18   ?Temp: 99.3 ?F (37.4 ?C) 98.2 ?F (36.8 ?C) 97.7 ?F (36.5 ?C) 97.9 ?F (36.6 ?C)  ?TempSrc:  Oral Oral   ?SpO2: 93% 93% 93% 95%  ?Weight:      ?Height:      ? ? ?Intake/Output Summary (Last 24 hours) at 09/21/2021 0814 ?Last data filed at 09/21/2021 09/23/2021 ?Gross per 24 hour  ?Intake 568.27 ml  ?Output 350 ml  ?Net 218.27 ml  ? ? ?Filed Weights  ? 09/19/21 1106  ?Weight: 59 kg  ?  ? ?Physical Exam:  ?General: awake, alert, NAD ?Respiratory: normal respiratory effort. Scattered  crackles ?Cardiovascular: quick capillary refill, normal S1/S2, RRR ?Gastrointestinal: soft, NT, ND ?Nervous: A&O x1. Patient is experiencing some acute delirium and is not oriented to place or time. no gross focal neurologic deficits, normal speech. Able to follow commands.  ?Extremities: moves all equally, no edema, normal tone ?Skin: dry, intact, normal temperature, normal color. No rashes, lesions or ulcers on exposed skin ?Psychiatry: normal mood, congruent affect ? ?Labs   ?I have personally  reviewed the following labs and imaging studies ?CBC ?   ?Component Value Date/Time  ? WBC 7.5 09/21/2021 0437  ? RBC 3.06 (L) 09/21/2021 0437  ? HGB 8.8 (L) 09/21/2021 0437  ? HCT 26.9 (L) 09/21/2021 0437  ? PLT 124 (L) 09/21/2021 0437  ? MCV 87.9 09/21/2021 0437  ? MCH 28.8 09/21/2021 0437  ? MCHC 32.7 09/21/2021 0437  ? RDW 14.9 09/21/2021 0437  ? LYMPHSABS 0.5 (L) 09/19/2021 1121  ? MONOABS 0.5 09/19/2021 1121  ? EOSABS 0.0 09/19/2021 1121  ? BASOSABS 0.1 09/19/2021 1121  ? ? ?  Latest Ref Rng & Units 09/21/2021  ?  4:37 AM 09/20/2021  ?  5:18 AM 09/19/2021  ? 11:21 AM  ?BMP  ?Glucose 70 - 99 mg/dL 91   89   [id number]    ?BUN 8 - 23 mg/dL 28   47   67    ?Creatinine 0.44 - 1.00 mg/dL 0.960.89   0.450.94   4.091.10    ?Sodium 135 - 145 mmol/L 141   140   136    ?Potassium 3.5 - 5.1 mmol/L 3.7   3.4   3.1    ?Chloride 98 - 111 mmol/L 113   114   105    ?CO2 22 - 32 mmol/L 24   22   22     ?Calcium 8.9 - 10.3 mg/dL 8.7   8.7   9.2    ? ? ?CT Head Wo Contrast ? ?Result Date: 09/19/2021 ?CLINICAL DATA:  Altered mental status EXAM: CT HEAD WITHOUT CONTRAST TECHNIQUE: Contiguous axial images were obtained from the base of the skull through the vertex without intravenous contrast. RADIATION DOSE REDUCTION: This exam was performed according to the departmental dose-optimization program which includes automated exposure control, adjustment of the mA and/or kV according to patient size and/or use of iterative reconstruction technique. COMPARISON:  08/12/2021 FINDINGS: Brain: No acute intracranial findings are seen. There are no signs of bleeding. Cortical sulci are prominent. There is decreased density in the periventricular and subcortical white matter. Lacunar infarct is seen in the left basal ganglia. Vascular: Scattered arterial calcifications are seen. Skull: Unremarkable. Sinuses/Orbits: Unremarkable. Other: None IMPRESSION: No acute intracranial findings are seen in noncontrast CT brain. Atrophy. Small-vessel disease. Old lacunar infarct  is seen in the left basal ganglia. Electronically Signed   By: Ernie AvenaPalani  Rathinasamy M.D.   On: 09/19/2021 13:10  ? ?CT CHEST ABDOMEN PELVIS W CONTRAST ? ?Result Date: 09/19/2021 ?CLINICAL DATA:  Hypoxia, abdominal distention EXAM: CT CHEST, ABDOMEN, AND PELVIS WITH CONTRAST TECHNIQUE: Multidetector CT imaging of the chest, abdomen and pelvis was performed following the standard protocol during bolus administration of intravenous contrast. RADIATION DOSE REDUCTION: This exam was performed according to the departmental dose-optimization program which includes automated exposure control, adjustment of the mA and/or kV according to patient size and/or use of iterative reconstruction technique. CONTRAST:  100mL OMNIPAQUE IOHEXOL 300 MG/ML  SOLN COMPARISON:  CT abdomen done on 12/13/2020 FINDINGS: CT CHEST FINDINGS Cardiovascular: Heart is enlarged in size. Scattered coronary artery  calcifications are seen. There is homogeneous enhancement in thoracic aorta. There are no filling defects in the central pulmonary artery branches in the mediastinum. Mediastinum/Nodes: No significant lymphadenopathy is seen. There is fluid in the lumen of thoracic esophagus. There is mild mucosal enhancement in the thoracic esophagus. Lungs/Pleura: Increased interstitial markings are seen in the right upper lobe, right middle lobe and right lower lobe. Subtle increase in interstitial markings are seen in the periphery of left lower lung fields. There is no focal consolidation. There is no pleural effusion or pneumothorax. Musculoskeletal: Bony structures in the thorax are unremarkable. CT ABDOMEN PELVIS FINDINGS Hepatobiliary: There is a 1 cm hypervascular structure in the dome of right lobe of liver. There is no dilation of bile ducts. Gallbladder is not seen. Pancreas: No focal abnormality is seen. Spleen: Unremarkable. Adrenals/Urinary Tract: Adrenals are unremarkable. There is no hydronephrosis. There are multiple cortical and parapelvic  cysts in both kidneys. There are no renal or ureteral stones. Evaluation of the urinary bladder is limited by severe beam hardening artifacts. Stomach/Bowel: Small hiatal hernia is seen. Stomach is moderately d

## 2021-09-22 DIAGNOSIS — E860 Dehydration: Secondary | ICD-10-CM | POA: Diagnosis not present

## 2021-09-22 DIAGNOSIS — E876 Hypokalemia: Secondary | ICD-10-CM | POA: Diagnosis not present

## 2021-09-22 DIAGNOSIS — K219 Gastro-esophageal reflux disease without esophagitis: Secondary | ICD-10-CM | POA: Diagnosis not present

## 2021-09-22 DIAGNOSIS — J9601 Acute respiratory failure with hypoxia: Secondary | ICD-10-CM | POA: Diagnosis not present

## 2021-09-22 LAB — CBC
HCT: 27 % — ABNORMAL LOW (ref 36.0–46.0)
Hemoglobin: 8.9 g/dL — ABNORMAL LOW (ref 12.0–15.0)
MCH: 28.3 pg (ref 26.0–34.0)
MCHC: 33 g/dL (ref 30.0–36.0)
MCV: 86 fL (ref 80.0–100.0)
Platelets: 133 10*3/uL — ABNORMAL LOW (ref 150–400)
RBC: 3.14 MIL/uL — ABNORMAL LOW (ref 3.87–5.11)
RDW: 14.6 % (ref 11.5–15.5)
WBC: 8.4 10*3/uL (ref 4.0–10.5)
nRBC: 0 % (ref 0.0–0.2)

## 2021-09-22 NOTE — Progress Notes (Signed)
Mobility Specialist - Progress Note ? ? ? 09/22/21 1600  ?Mobility  ?Activity Refused mobility  ? ? ? ?Pt refused mobility d/t fatigue -- second attempt this date. Will re attempt at a later date and time. ? ?Clarisa Schools ?Mobility Specialist ?09/22/21, 4:33 PM ? ? ? ? ?

## 2021-09-22 NOTE — Progress Notes (Signed)
?PROGRESS NOTE ? ?Faith Vasquez    DOB: 11-Apr-1923, 88 y.o.  ?[id number]  ?  Code Status: Full Code   ?DOA: 09/19/2021   LOS: 3  ? ?Brief hospital course  ?Faith Vasquez is a 88 y.o. female with a PMH significant for diverticulosis, HTN, advanced dementia. ? ?They presented from memory care unit at [facility] to the ED on 09/19/2021 with SOB and abdominal distention x 1 days. She was hypoxic and tachypneic on arrival requiring 4L O2. She denies urinary symptoms.  ? ?In the ED, Significant findings included multifocal pneumonia on chest CT. No evidence of intestinal obstruction. Appearance of recent compression fracture of L1. LA normal, troponin negative, respiratory panel negative, leukocytosis of 11.0.  ?Urinalysis positive for signs of infection.  ? ?They were treated with IV fluids and antibiotics. General surgery was consulted.   ?Patient was admitted to medicine service for further workup and management of acute hypoxic respiratory failure as outlined in detail below. ? ?09/22/21 -stable, improving ? ?Assessment & Plan  ?Principal Problem: ?  Acute respiratory failure ([place]) ?Active Problems: ?  Multifocal pneumonia ?  GERD (gastroesophageal reflux disease) ?  Hypokalemia ?  Dehydration ?  Acute sepsis ([place]) ? ?Acute hypoxic respiratory failure  multifocal PNA- improving. Patient denies respiratory complaints and is able to speak in full sentences without dyspnea. O2 has decreased to 2L.  ?- continue IV Abx and change to PO tomorrow ?- f/u cultures NGTD ?- incentive spirometer, flutter valve ?- wean to room air as tolerated.  ?  ?Abdominal pain- discussed care with general surgery. Agree that is likely related to illeus and evidence of viral gastroenteritis which seems to be improving/resolving. Patient was able to tolerate liquid diet. Abdominal pain improved. Having regular Bms. Now on regular diet ?- anti-emetics PRN ?- strict I/O ?- advanced diet and patient tolerating at breakfast time. ?   ?Dehydration- Secondary to GI losses from nausea, vomiting and poor oral intake ?Cr returned to normal baseline ?- discontinue IV fluid hydration now that she's taking good PO ?- BMP am ?  ?Hypokalemia- resolved K+ 3.1>3.4>3.7 ?Secondary to GI losses from nausea and vomiting ?- replete PRN ? ?Thrombocytopenia  anemia- mild and chronic. No signs of active bleeding. Platelets [id number]. Hgb 10.1>8.8>8.9, likely dilutional ? ?Body mass index is 21.63 kg/m?. ? ?VTE ppx: enoxaparin (LOVENOX) injection 40 mg Start: 09/20/21 2200 ? ? ?Diet:  ?   ?Diet  ? Diet regular Room service appropriate? Yes; Fluid consistency: Thin  ? ?Consultants: ?General surgery ?Subjective 09/22/21   ? ?Pt reports feeling well. Denies any complaints today. Exhibits some confusion on why she is here. Would like to go home today. ?  ?Objective  ? ?Vitals:  ? 09/21/21 1816 09/21/21 2319 09/22/21 0414 09/22/21 [id number]  ?BP: 132/62 (!) 119/43 (!) 109/51 (!) 121/56  ?Pulse: 78 86 66 70  ?Resp: 18 18 18 16   ?Temp: 97.9 ?F (36.6 ?C) 98.2 ?F (36.8 ?C) 98 ?F (36.7 ?C) 98 ?F (36.7 ?C)  ?TempSrc:  Oral Oral   ?SpO2: 94% 94% 95% 90%  ?Weight:      ?Height:      ? ? ?Intake/Output Summary (Last 24 hours) at 09/22/2021 [id number] ?Last data filed at 09/21/2021 1414 ?Gross per 24 hour  ?Intake 0 ml  ?Output --  ?Net 0 ml  ? ? ?Filed Weights  ? 09/19/21 1106  ?Weight: 59 kg  ?  ? ?Physical Exam:  ?General: awake, alert, NAD ?Respiratory: normal respiratory effort. Scattered crackles ?Cardiovascular: quick  capillary refill, normal S1/S2, RRR ?Gastrointestinal: soft, NT, ND ?Nervous: A&O x1. Patient is experiencing some acute delirium and is not oriented to place or time. no gross focal neurologic deficits, normal speech. Able to follow commands.  ?Extremities: moves all equally, no edema, normal tone ?Skin: dry, intact, normal temperature, normal color. No rashes, lesions or ulcers on exposed skin ?Psychiatry: normal mood, congruent affect ? ?Labs   ?I have personally  reviewed the following labs and imaging studies ?CBC ?   ?Component Value Date/Time  ? WBC 7.5 09/21/2021 0437  ? RBC 3.06 (L) 09/21/2021 0437  ? HGB 8.8 (L) 09/21/2021 0437  ? HCT 26.9 (L) 09/21/2021 0437  ? PLT 124 (L) 09/21/2021 0437  ? MCV 87.9 09/21/2021 0437  ? MCH 28.8 09/21/2021 0437  ? MCHC 32.7 09/21/2021 0437  ? RDW 14.9 09/21/2021 0437  ? LYMPHSABS 0.5 (L) 09/19/2021 1121  ? MONOABS 0.5 09/19/2021 1121  ? EOSABS 0.0 09/19/2021 1121  ? BASOSABS 0.1 09/19/2021 1121  ? ? ?  Latest Ref Rng & Units 09/21/2021  ?  4:37 AM 09/20/2021  ?  5:18 AM 09/19/2021  ? 11:21 AM  ?BMP  ?Glucose 70 - 99 mg/dL 91   89   180    ?BUN 8 - 23 mg/dL 28   47   67    ?Creatinine 0.44 - 1.00 mg/dL 0.89   0.94   1.10    ?Sodium 135 - 145 mmol/L 141   140   136    ?Potassium 3.5 - 5.1 mmol/L 3.7   3.4   3.1    ?Chloride 98 - 111 mmol/L 113   114   105    ?CO2 22 - 32 mmol/L 24   22   22     ?Calcium 8.9 - 10.3 mg/dL 8.7   8.7   9.2    ? ? ?No results found. ? ?Disposition Plan & Communication  ?Patient status: Inpatient  ?Admitted From: SNF ?Planned disposition location: Skilled nursing facility ?Anticipated discharge date: 5/1 pending improvement in respiratory status and can return to SNF ? ?Family Communication: son ?  ?Author: ?Richarda Osmond, DO ?[facility] ?09/22/2021, 8:12 AM  ? ?Available by Epic secure chat 7AM-7PM. ?If 7PM-7AM, please contact night-coverage.  ?[facility] contact information found on [email or web address] ? ?

## 2021-09-23 DIAGNOSIS — A419 Sepsis, unspecified organism: Secondary | ICD-10-CM | POA: Diagnosis not present

## 2021-09-23 DIAGNOSIS — K219 Gastro-esophageal reflux disease without esophagitis: Secondary | ICD-10-CM | POA: Diagnosis not present

## 2021-09-23 DIAGNOSIS — E876 Hypokalemia: Secondary | ICD-10-CM | POA: Diagnosis not present

## 2021-09-23 DIAGNOSIS — J9601 Acute respiratory failure with hypoxia: Secondary | ICD-10-CM | POA: Diagnosis not present

## 2021-09-23 MED ORDER — ACETAMINOPHEN 325 MG PO TABS: 325.0000 mg | ORAL_TABLET | Freq: Three times a day (TID) | ORAL | Status: DC

## 2021-09-23 MED ORDER — AMOXICILLIN-POT CLAVULANATE 875-125 MG PO TABS: 1.0000 | ORAL_TABLET | Freq: Two times a day (BID) | ORAL | 0 refills | Status: AC

## 2021-09-23 NOTE — TOC Transition Note (Addendum)
Transition of Care (TOC) - CM/SW Discharge Note ? ? ?Patient Details  ?Name: Faith Vasquez ?MRN: [id number] ?Date of Birth: Jan 28, 1923 ? ?Transition of Care (TOC) CM/SW Contact:  ?Truddie Hidden, RN ?Phone Number: ?09/23/2021, 1:44 PM ? ? ?Clinical Narrative:    ?Spoke with Bonita Quin in admissions. Confirmed patient can return to facility today. Son will transport. Bonita Quin gave number for nurse to call report [phone], fax number [phone]. TOC signing off ? ? ?  ?  ? ? ?Patient Goals and CMS Choice ?  ?  ?  ? ?Discharge Placement ?  ?           ?  ?  ?  ?  ? ?Discharge Plan and Services ?  ?  ?           ?  ?  ?  ?  ?  ?  ?  ?  ?  ?  ? ?Social Determinants of Health (SDOH) Interventions ?  ? ? ?Readmission Risk Interventions ?   ? View : No data to display.  ?  ?  ?  ? ? ? ? ? ?

## 2021-09-23 NOTE — Discharge Summary (Signed)
? ? ?Physician Discharge Summary  ?Patient: Faith Vasquez [id number] DOB: 14-Oct-1922   Code Status: Full Code ?Admit date: 09/19/2021 ?Discharge date: 09/23/2021 ?Disposition: Skilled nursing facility, PT, OT, and RN ?PCP: Verl Blalock, NP ? ?Recommendations for Outpatient Follow-up:  ?Follow up with PCP within 1-2 weeks ?monitor respiratory status and completion of antibiotic treatment ?Monitor blood pressure as home medications were held with low/normal blood pressures while inpatient ? ?Discharge Diagnoses:  ?Principal Problem: ?  Acute respiratory failure ([name]) ?Active Problems: ?  Multifocal pneumonia ?  GERD (gastroesophageal reflux disease) ?  Hypokalemia ?  Dehydration ?  Acute sepsis ([place]) ? ?Brief Hospital Course Summary: ?[name] is a 88 y.o. female with a PMH significant for diverticulosis, HTN, advanced dementia. ?  ?They presented from memory care unit at [name] to the ED on 09/19/2021 with SOB and abdominal distention x 1 days. She was hypoxic and tachypneic on arrival requiring 4L O2. She denies urinary symptoms.  ?  ?In the ED, Significant findings included multifocal pneumonia on chest CT. No evidence of intestinal obstruction. Appearance of recent compression fracture of L1. LA normal, troponin negative, respiratory panel negative, leukocytosis of 11.0.  ?Urinalysis positive for signs of infection but did not have symptoms.  ?  ?They were treated with IV fluids and antibiotics.  ?General surgery was initially consulted for the abdominal distention which was presumed ileus and had improved spontaneously as patient was having Bms in ED so no intervention was needed. ? ?CAP- patient was gradually weaned from oxygen and on day of discharge was able to ambulate in room without desaturations while ORA. She was transitioned from IV unasyn to PO augmentin to complete her treatment for infection.  ? ?HTN- her home blood pressure medications were held in setting of normotension/low blood  pressures while inpatient and discontinued at discharge. Her statin medication was also discontinued to decrease pill burden and not indicated.  ? ?Delirium- patient had intermittent episodes of delirium primarily in early mornings which resolved during the day. Presume that this will lesson with return to her familiar environment and resolution of her acute illness.   ? ?Discharge Condition: Good, improved ?Recommended discharge diet: Regular healthy diet ? ?Consultations: ?General surgery ? ?Procedures/Studies: ?None  ? ? ?Allergies as of 09/23/2021   ? ?   Reactions  ? Codeine   ? Iodine   ? Macrobid [nitrofurantoin] Other (See Comments)  ? Unknown reaction.  ? Ciprofloxacin Rash  ? Other reaction(s): whelps  ? ?  ? ?  ?Medication List  ?  ? ?STOP taking these medications   ? ?amLODipine 5 MG tablet ?Commonly known as: NORVASC ?  ?atorvastatin 20 MG tablet ?Commonly known as: LIPITOR ?  ?sennosides-docusate sodium 8.6-50 MG tablet ?Commonly known as: SENOKOT-S ?  ? ?  ? ?TAKE these medications   ? ?acetaminophen 325 MG tablet ?Commonly known as: TYLENOL ?Take 1 tablet (325 mg total) by mouth 3 (three) times daily. ?  ?ALPRAZolam 0.25 MG tablet ?Commonly known as: Duanne Moron ?Take 0.25 mg by mouth 2 (two) times daily as needed for anxiety. ?  ?amoxicillin-clavulanate 875-125 MG tablet ?Commonly known as: Augmentin ?Take 1 tablet by mouth 2 (two) times daily for 10 days. ?  ?aspirin 81 MG EC tablet ?Chew 81 mg by mouth daily. ?  ?feeding supplement Liqd ?Take 237 mLs by mouth 2 (two) times daily between meals. ?  ?guaifenesin 400 MG Tabs tablet ?Commonly known as: HUMIBID E ?Take 400 mg by mouth every 4 (four)  hours as needed. ?  ?ondansetron 4 MG tablet ?Commonly known as: ZOFRAN ?Take 4 mg by mouth every 6 (six) hours as needed for nausea or vomiting. ?  ?polyethylene glycol 17 g packet ?Commonly known as: MIRALAX / GLYCOLAX ?Take 17 g by mouth 2 (two) times daily. ?What changed:  ?when to take this ?additional  instructions ?  ?Vitamin D3 50 MCG (2000 UT) Tabs ?Take 1 capsule by mouth daily. Wednesday ?  ? ?  ? ? ? ?Subjective   ?Pt reports she is feeling well. She denies dyspnea, SOB, chest pain. She is ready to go home.  ?Objective  ?Blood pressure (!) 119/59, pulse 71, temperature 98.1 ?F (36.7 ?C), resp. rate 16, height 5\' 5"  (1.651 m), weight 59 kg, SpO2 93 %.  ? ?General: Pt is alert, awake, not in acute distress ?Cardiovascular: RRR, S1/S2 +, no rubs, no gallops ?Respiratory: CTA bilaterally, no wheezing, no rhonchi. Mild crackled heard at bases ?Abdominal: Soft, NT, ND, bowel sounds + ?Extremities: no edema, no cyanosis ? ? ?The results of significant diagnostics from this hospitalization (including imaging, microbiology, ancillary and laboratory) are listed below for reference.  ? ?Imaging studies: ?CT Head Wo Contrast ? ?Result Date: 09/19/2021 ?CLINICAL DATA:  Altered mental status EXAM: CT HEAD WITHOUT CONTRAST TECHNIQUE: Contiguous axial images were obtained from the base of the skull through the vertex without intravenous contrast. RADIATION DOSE REDUCTION: This exam was performed according to the departmental dose-optimization program which includes automated exposure control, adjustment of the mA and/or kV according to patient size and/or use of iterative reconstruction technique. COMPARISON:  08/12/2021 FINDINGS: Brain: No acute intracranial findings are seen. There are no signs of bleeding. Cortical sulci are prominent. There is decreased density in the periventricular and subcortical white matter. Lacunar infarct is seen in the left basal ganglia. Vascular: Scattered arterial calcifications are seen. Skull: Unremarkable. Sinuses/Orbits: Unremarkable. Other: None IMPRESSION: No acute intracranial findings are seen in noncontrast CT brain. Atrophy. Small-vessel disease. Old lacunar infarct is seen in the left basal ganglia. Electronically Signed   By: Elmer Picker M.D.   On: 09/19/2021 13:10  ? ?CT  CHEST ABDOMEN PELVIS W CONTRAST ? ?Result Date: 09/19/2021 ?CLINICAL DATA:  Hypoxia, abdominal distention EXAM: CT CHEST, ABDOMEN, AND PELVIS WITH CONTRAST TECHNIQUE: Multidetector CT imaging of the chest, abdomen and pelvis was performed following the standard protocol during bolus administration of intravenous contrast. RADIATION DOSE REDUCTION: This exam was performed according to the departmental dose-optimization program which includes automated exposure control, adjustment of the mA and/or kV according to patient size and/or use of iterative reconstruction technique. CONTRAST:  13mL OMNIPAQUE IOHEXOL 300 MG/ML  SOLN COMPARISON:  CT abdomen done on 12/13/2020 FINDINGS: CT CHEST FINDINGS Cardiovascular: Heart is enlarged in size. Scattered coronary artery calcifications are seen. There is homogeneous enhancement in thoracic aorta. There are no filling defects in the central pulmonary artery branches in the mediastinum. Mediastinum/Nodes: No significant lymphadenopathy is seen. There is fluid in the lumen of thoracic esophagus. There is mild mucosal enhancement in the thoracic esophagus. Lungs/Pleura: Increased interstitial markings are seen in the right upper lobe, right middle lobe and right lower lobe. Subtle increase in interstitial markings are seen in the periphery of left lower lung fields. There is no focal consolidation. There is no pleural effusion or pneumothorax. Musculoskeletal: Bony structures in the thorax are unremarkable. CT ABDOMEN PELVIS FINDINGS Hepatobiliary: There is a 1 cm hypervascular structure in the dome of right lobe of liver. There is no dilation  of bile ducts. Gallbladder is not seen. Pancreas: No focal abnormality is seen. Spleen: Unremarkable. Adrenals/Urinary Tract: Adrenals are unremarkable. There is no hydronephrosis. There are multiple cortical and parapelvic cysts in both kidneys. There are no renal or ureteral stones. Evaluation of the urinary bladder is limited by severe  beam hardening artifacts. Stomach/Bowel: Small hiatal hernia is seen. Stomach is moderately distended with air-fluid level. Small bowel loops are not dilated. Appendix is not seen. There is no focal pericecal inf

## 2021-09-23 NOTE — Progress Notes (Signed)
Report given to [place] at [facility] in [place]. Son transporting patient back to facility.All belongings with pt. ?

## 2021-09-24 LAB — CULTURE, BLOOD (SINGLE)
Culture: NO GROWTH
Special Requests: ADEQUATE

## 2021-10-26 IMAGING — CT CT ABD-PELV W/O CM
2 of 4 series · 16 of 46 positions shown, 18 images · non-contrast
Comparison: September 04, 2020.

CLINICAL DATA: Acute generalized abdominal pain.

EXAM:
CT ABDOMEN AND PELVIS WITHOUT CONTRAST
TECHNIQUE: Multidetector CT imaging of the abdomen and pelvis was performed
following the standard protocol without IV contrast.

[Series 2: axial (person_name) (person_name) · axial · 0.75mm/px · z∈[-1135,-720]mm · 13 of 91 slices shown, 15 images]
[im 4/91  soft-tissue]
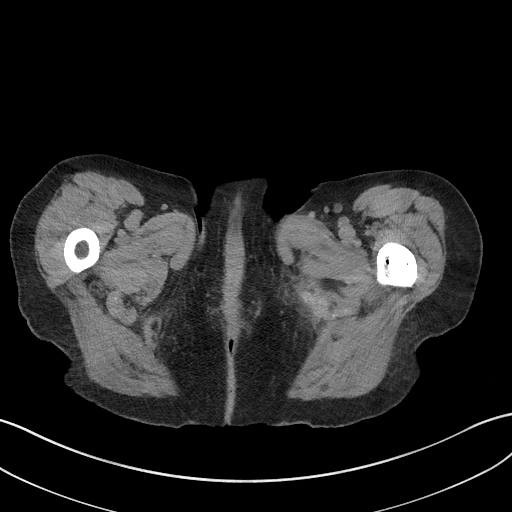
[im 4/91  bone]
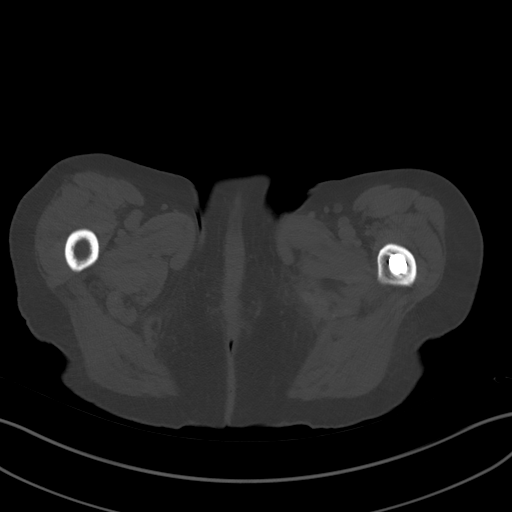
[im 11/91  soft-tissue]
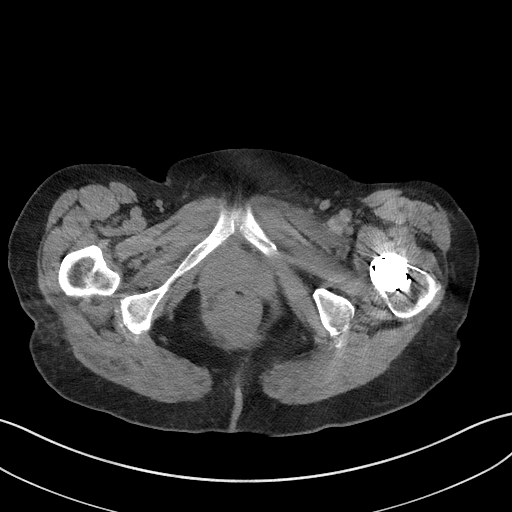
[im 19/91  soft-tissue]
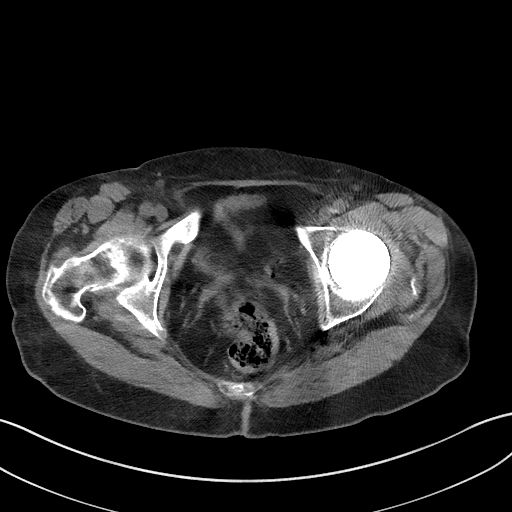
[im 26/91  soft-tissue]
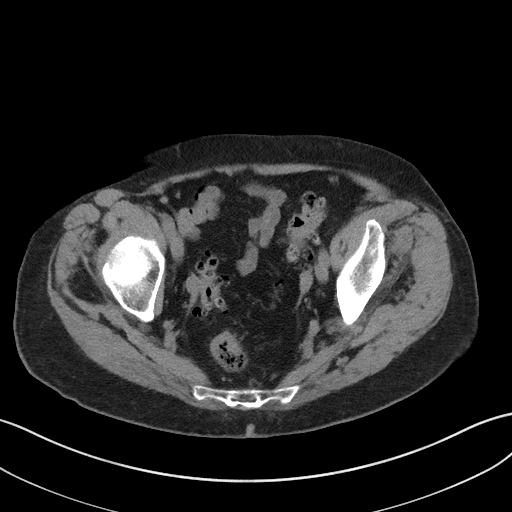
[im 33/91  soft-tissue]
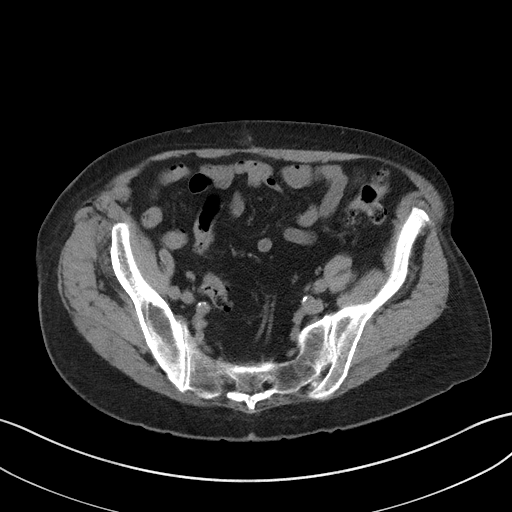
[im 40/91  soft-tissue]
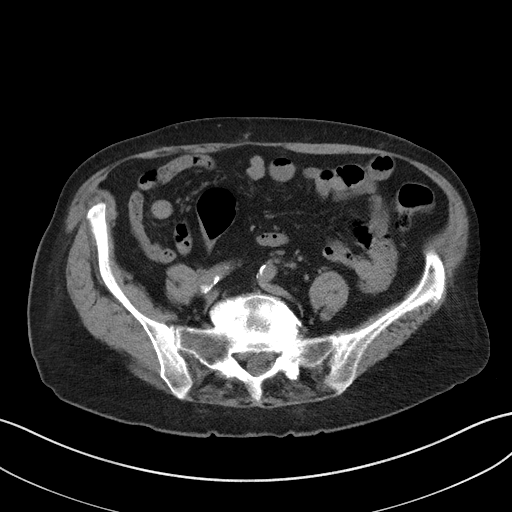
[im 47/91  soft-tissue]
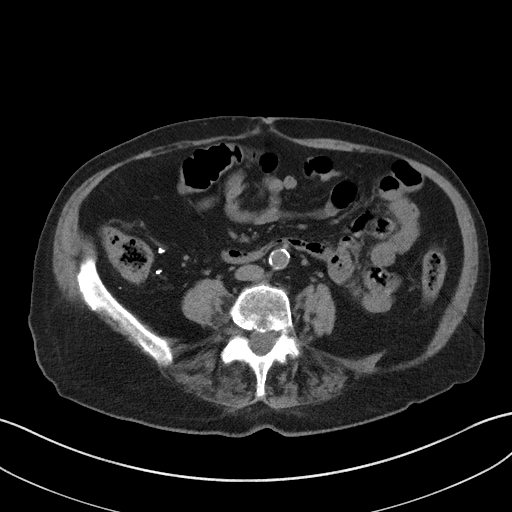
[im 51/91  soft-tissue]
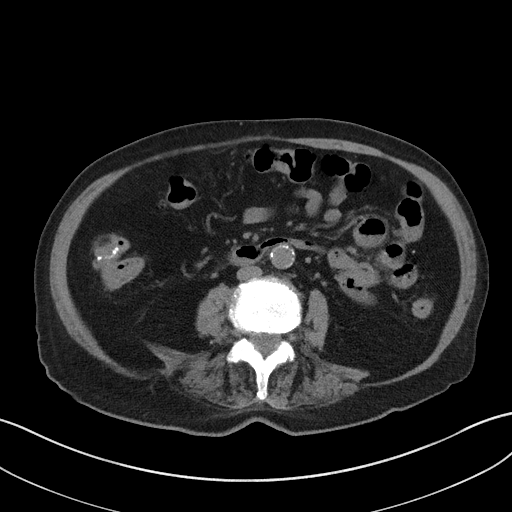
[im 58/91  soft-tissue]
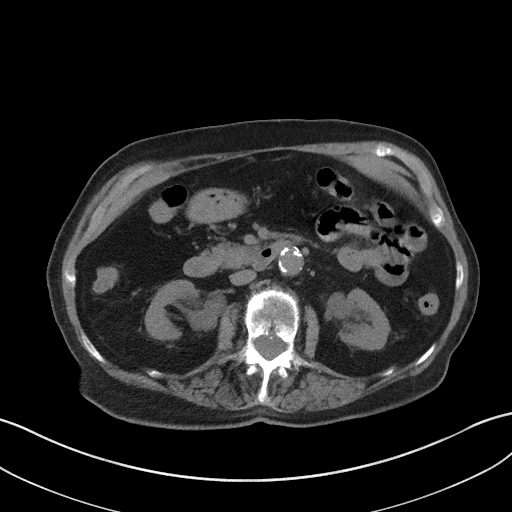
[im 58/91  bone]
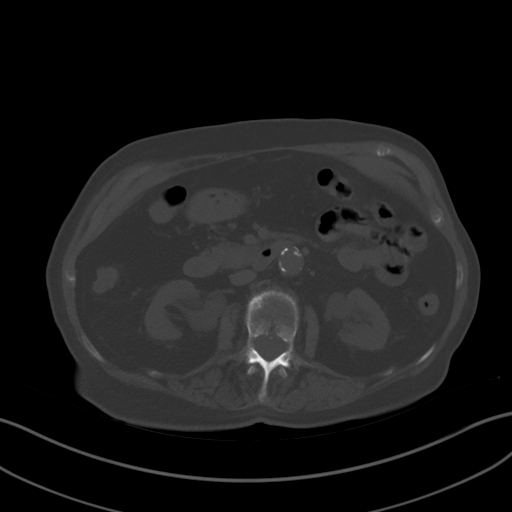
[im 65/91  soft-tissue]
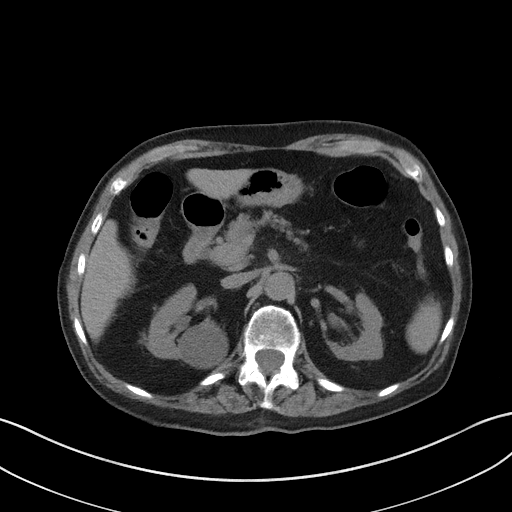
[im 73/91  soft-tissue]
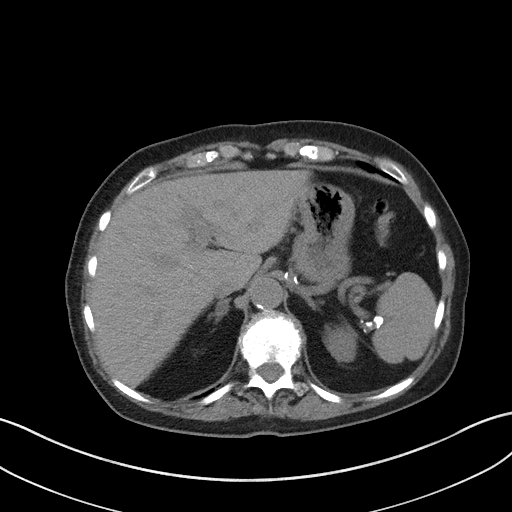
[im 80/91  soft-tissue]
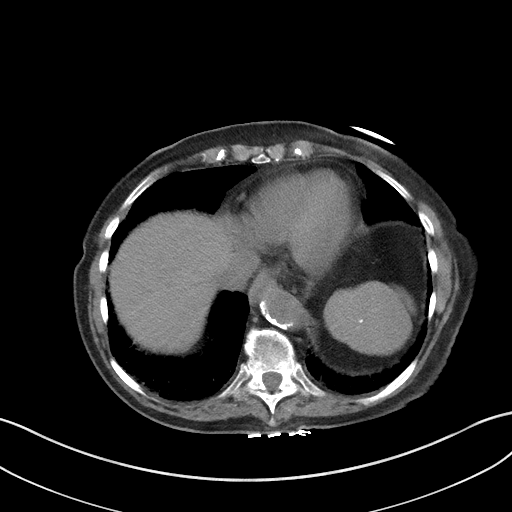
[im 87/91  soft-tissue]
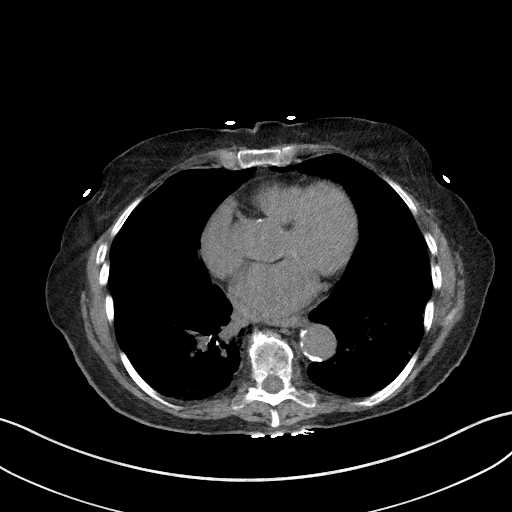

[Series 5: coronal st · coronal · 0.72mm/px · 3 of 76 slices shown]
[im 26/76  soft-tissue]
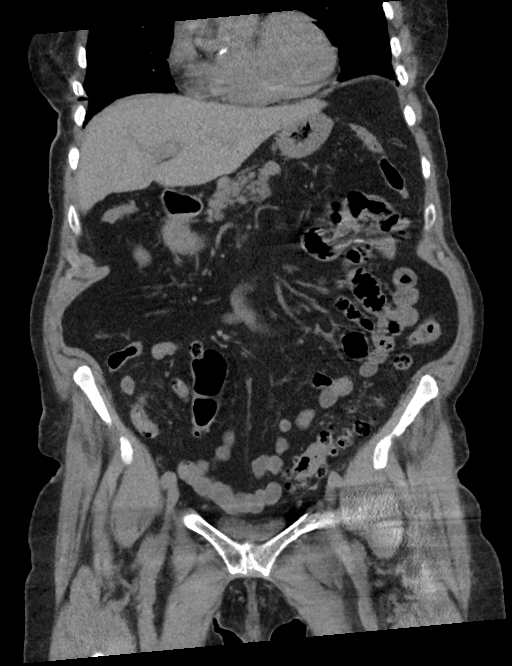
[im 34/76  soft-tissue]
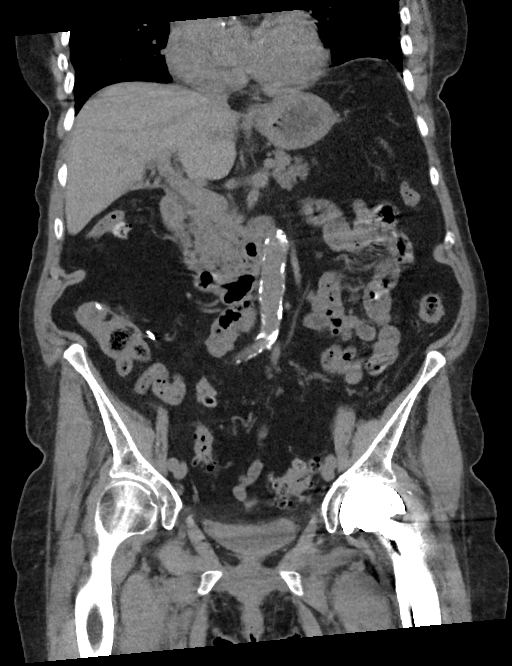
[im 42/76  soft-tissue]
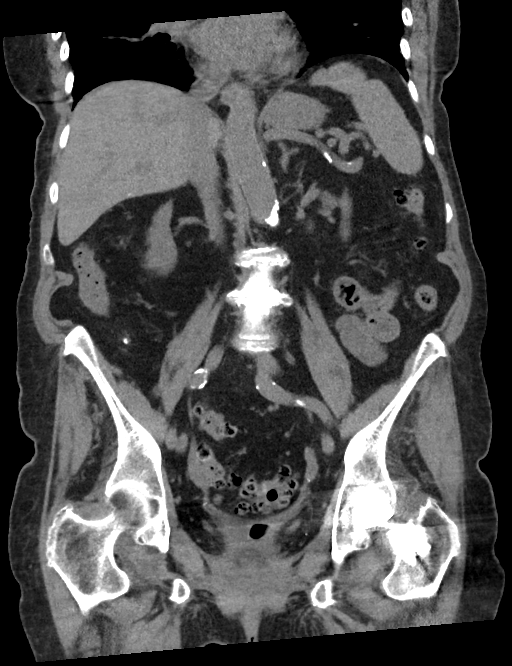

[16 of 46 positions shown; findings below may reference images not displayed]

FINDINGS: Lower chest: No acute abnormality.

Hepatobiliary: No focal liver abnormality is seen. Status post
cholecystectomy. No biliary dilatation.

Pancreas: Unremarkable. No pancreatic ductal dilatation or
surrounding inflammatory changes.

Spleen: Normal in size without focal abnormality.

Adrenals/Urinary Tract: Adrenal glands appear normal. Stable right
renal cortical cysts are noted. Stable probable bilateral parapelvic
cyst. No hydronephrosis or renal obstruction is noted. No renal or
ureteral calculi are noted. Urinary bladder is unremarkable.

Stomach/Bowel: The stomach appears normal. There is no evidence of
bowel obstruction or inflammation. Diverticulosis of descending and
sigmoid colon is noted without inflammation.

Vascular/Lymphatic: Aortic atherosclerosis. No enlarged abdominal or
pelvic lymph nodes.

Reproductive: Status post hysterectomy. No adnexal masses.

Other: No abdominal wall hernia or abnormality. No abdominopelvic
ascites.

Musculoskeletal: No acute or significant osseous findings.
IMPRESSION: Diverticulosis of descending and sigmoid colon without inflammation.

No acute abnormality seen in the abdomen or pelvis.

Aortic Atherosclerosis ([id number]).

## 2021-12-19 ENCOUNTER — Emergency Department: Payer: Medicare Other

## 2021-12-19 ENCOUNTER — Emergency Department
Admission: EM | Admit: 2021-12-19 | Discharge: 2021-12-19 | Disposition: A | Discharge status: Home / Self Care | Payer: Medicare Other | Source: Home / Self Care | Attending: Emergency Medicine | Admitting: Emergency Medicine

## 2021-12-19 ENCOUNTER — Other Ambulatory Visit: Payer: Self-pay

## 2021-12-19 DIAGNOSIS — N3000 Acute cystitis without hematuria: Secondary | ICD-10-CM | POA: Insufficient documentation

## 2021-12-19 DIAGNOSIS — W19XXXA Unspecified fall, initial encounter: Secondary | ICD-10-CM

## 2021-12-19 DIAGNOSIS — S72001A Fracture of unspecified part of neck of right femur, initial encounter for closed fracture: Secondary | ICD-10-CM | POA: Diagnosis not present

## 2021-12-19 DIAGNOSIS — W06XXXA Fall from bed, initial encounter: Secondary | ICD-10-CM | POA: Insufficient documentation

## 2021-12-19 DIAGNOSIS — S0083XA Contusion of other part of head, initial encounter: Secondary | ICD-10-CM | POA: Insufficient documentation

## 2021-12-19 DIAGNOSIS — S81811A Laceration without foreign body, right lower leg, initial encounter: Secondary | ICD-10-CM | POA: Insufficient documentation

## 2021-12-19 DIAGNOSIS — S72002A Fracture of unspecified part of neck of left femur, initial encounter for closed fracture: Secondary | ICD-10-CM | POA: Diagnosis not present

## 2021-12-19 DIAGNOSIS — F0390 Unspecified dementia without behavioral disturbance: Secondary | ICD-10-CM | POA: Insufficient documentation

## 2021-12-19 LAB — URINALYSIS, COMPLETE (UACMP) WITH MICROSCOPIC
Bilirubin Urine: NEGATIVE
Glucose, UA: NEGATIVE mg/dL
Hgb urine dipstick: NEGATIVE
Ketones, ur: NEGATIVE mg/dL
Nitrite: POSITIVE — AB
Protein, ur: NEGATIVE mg/dL
Specific Gravity, Urine: 1.019 (ref 1.005–1.030)
WBC, UA: >50 WBC/hpf — ABNORMAL HIGH (ref 0–5)
pH: 5 (ref 5.0–8.0)

## 2021-12-19 LAB — CBC
HCT: 40.6 % (ref 36.0–46.0)
Hemoglobin: 13 g/dL (ref 12.0–15.0)
MCH: 27.7 pg (ref 26.0–34.0)
MCHC: 32 g/dL (ref 30.0–36.0)
MCV: 86.4 fL (ref 80.0–100.0)
Platelets: 145 10*3/uL — ABNORMAL LOW (ref 150–400)
RBC: 4.7 MIL/uL (ref 3.87–5.11)
RDW: 13.7 % (ref 11.5–15.5)
WBC: 8.6 10*3/uL (ref 4.0–10.5)
nRBC: 0 % (ref 0.0–0.2)

## 2021-12-19 LAB — BASIC METABOLIC PANEL
Anion gap: 6 (ref 5–15)
BUN: 21 mg/dL (ref 8–23)
CO2: 24 mmol/L (ref 22–32)
Calcium: 9.7 mg/dL (ref 8.9–10.3)
Chloride: 111 mmol/L (ref 98–111)
Creatinine, Ser: 1.03 mg/dL — ABNORMAL HIGH (ref 0.44–1.00)
GFR, Estimated: 50 mL/min — ABNORMAL LOW (ref >60)
Glucose, Bld: 106 mg/dL — ABNORMAL HIGH (ref 70–99)
Potassium: 3.8 mmol/L (ref 3.5–5.1)
Sodium: 141 mmol/L (ref 135–145)

## 2021-12-19 LAB — TSH: TSH: 1.43 u[IU]/mL (ref 0.350–4.500)

## 2021-12-19 LAB — TROPONIN I (HIGH SENSITIVITY): Troponin I (High Sensitivity): 6 ng/L (ref <18)

## 2021-12-19 MED ORDER — CEPHALEXIN 500 MG PO CAPS: 500.0000 mg | ORAL_CAPSULE | Freq: Four times a day (QID) | ORAL | 0 refills | Status: DC

## 2021-12-19 NOTE — ED Notes (Signed)
ED provider at bedside.

## 2021-12-19 NOTE — ED Notes (Signed)
Pt ambulated in room with walker. Pt was steady on feet.

## 2021-12-19 NOTE — ED Provider Notes (Signed)
[facility] Provider Note    Event Date/Time   First MD Initiated Contact with Patient 12/19/21 [phone]     (approximate)   History   Fall   HPI  Faith Vasquez is a 88 y.o. female   with a PMH significant for diverticulosis, HTN, advanced dementia who presents for evaluation after unwitnessed fall at her nursing facility.  Per report this apparently happened last night and was not communicated to overnight staff until this morning with son states he felt this happened earlier today.  Patient states it happened sometime last night early this morning but is not 100% sure.  She states she slid out of bed.  She denies hitting her head or any acute pain.  Per patient and son no other sick symptoms such as chest pain, cough, shortness of breath, headache, sore throat patient is denying any other acute symptoms at this time.  She is not on any blood thinners aside from daily ASA.  She is on alprazolam and Risperdal which patient's son states she takes for "sundowning" and anxiety.      Physical Exam  Triage Vital Signs: ED Triage Vitals  Enc Vitals Group     BP 12/19/21 0842 (!) 123/53     Pulse Rate 12/19/21 0842 63     Resp 12/19/21 0842 16     Temp 12/19/21 0847 (!) 97.5 F (36.4 C)     Temp Source 12/19/21 0847 Axillary     SpO2 12/19/21 0847 94 %     Weight 12/19/21 0850 138 lb 10.7 oz (62.9 kg)     Height 12/19/21 0850 5\' 5"  (1.651 m)     Head Circumference --      Peak Flow --      Pain Score 12/19/21 0842 0     Pain Loc --      Pain Edu? --      Excl. in GC? --     Most recent vital signs: Vitals:   12/19/21 1030 12/19/21 1200  BP: 138/66 135/66  Pulse: 60 61  Resp: 20 20  Temp:    SpO2: 93% 95%    General: Awake, no distress.  CV:  Good peripheral perfusion.  2+ radial pulse. Resp:  Normal effort.  Clear bilaterally and slightly tachypneic. Abd:  No distention.  Soft throughout. Other:   CN II-XII grossly intact.  Appears to be a  very small bruise over the forehead that is nontender.  No other obvious trauma to the face scalp head or neck.  Patient is not oriented but baseline per son at bedside.  Small skin tear to the right lower leg just distal to the knee.  No tenderness/deformities over the C/T/L spine  No focal TTP over b/l shoulders, elbows, wrists, hips, knees, ankles  2+ b/l radial and PD pulses   No other obvious trauma to face, scalp ,head, neck or torso     ED Results / Procedures / Treatments  Labs (all labs ordered are listed, but only abnormal results are displayed) Labs Reviewed  BASIC METABOLIC PANEL - Abnormal; Notable for the following components:      Result Value   Glucose, Bld 106 (*)    Creatinine, Ser 1.03 (*)    GFR, Estimated 50 (*)    All other components within normal limits  CBC - Abnormal; Notable for the following components:   Platelets 145 (*)    All other components within normal limits  URINALYSIS, COMPLETE (UACMP) WITH  MICROSCOPIC - Abnormal; Notable for the following components:   Color, Urine YELLOW (*)    APPearance HAZY (*)    Nitrite POSITIVE (*)    Leukocytes,Ua LARGE (*)    WBC, UA >50 (*)    Bacteria, UA MANY (*)    All other components within normal limits  URINE CULTURE  TSH  TROPONIN I (HIGH SENSITIVITY)     EKG  EKG is remarkable for sinus rhythm with a parable of 262 and a left bundle branch block with a ventricular rate of 61 without evidence of acute ischemia or significant arrhythmia.  This is compared to EKG from 09/19/2021 it also showed evidence of left bundle branch block and a parable of 213  RADIOLOGY  CT head and C-spine my interpretation without evidence of skull fracture, intracranial hemorrhage, edema, mass effect or acute C-spine injury.  I reviewed radiology interpretation and agree their findings of same.  Chest x-ray my interpretation no evidence of a rib fracture, pneumothorax, large effusion, overt edema but does show some  opacities in the lower lungs unchanged from chest x-ray from 4/27.  I see no new consolidation.  I reviewed radiology interpretation and agree their findings of same.   PROCEDURES:  Critical Care performed: No  .1-3 Lead EKG Interpretation  Performed by: Gilles Chiquito, MD Authorized by: Gilles Chiquito, MD     Interpretation: non-specific     ECG rate assessment: normal     Rhythm: sinus rhythm     Ectopy: none     Conduction: abnormal     Abnormal conduction: 1st degree AV block     The patient is on the cardiac monitor to evaluate for evidence of arrhythmia and/or significant heart rate changes.   MEDICATIONS ORDERED IN ED: Medications - No data to display   IMPRESSION / MDM / ASSESSMENT AND PLAN / ED COURSE  I reviewed the triage vital signs and the nursing notes. Patient's presentation is most consistent with acute presentation with potential threat to life or bodily function.                               Differential diagnosis includes, but is not limited to fall possibly secondary to increased weakness from an arrhythmia, ACS, acute infectious process such as from cystitis versus polypharmacy given she is on alprazolam and Depakote.  I discussed concerns that are present may be contributing to increased risk for falls with son at bedside we will discuss with PCP next couple days to see if there is a safe alternative.  No clear focal deficits at this time suggest CVA.  Patient is demented not able to provide a clear history about exactly what happened given her age and without apparent bruising over the head I obtained a CT head and C-spine as well as a chest x-ray.  CT head and C-spine my interpretation without evidence of skull fracture, intracranial hemorrhage, edema, mass effect or acute C-spine injury.  I reviewed radiology interpretation and agree their findings of same.  Chest x-ray my interpretation no evidence of a rib fracture, pneumothorax, large effusion,  overt edema but does show some opacities in the lower lungs unchanged from chest x-ray from 4/27.  I see no new consolidation.  I reviewed radiology interpretation and agree their findings of same.  EKG is remarkable for sinus rhythm with a parable of 262 and a left bundle branch block with a ventricular rate of  61 without evidence of acute ischemia or significant arrhythmia.  This is compared to EKG from 09/19/2021 it also showed evidence of left bundle branch block and a parable of 213. Absence of any chest pain and nonelevated troponin is not suggestive of ACS.   CBC without leukocytosis or acute anemia.  BMP is unremarkable for any significant electrolyte or metabolic derangements.  TSH is WNL.  UA is concerning for cystitis with positive nitrites and large leuks esterase with greater than 50 WBCs.  Urine culture sent.  I do not believe patient is septic.  We will treat with a course of Keflex.  Suspect this may be contributing to patient's weakness that caused her to fall.  She was able to ambulate with assistance using walker emergency room which she does at baseline.  Given she does not ambulate with stable vitals and low suspicion for other immediate life-threatening process I think she is appropriate for discharge with outpatient follow-up.  Discharged in stable condition.  Strict return precautions provided and discussed with family.      FINAL CLINICAL IMPRESSION(S) / ED DIAGNOSES   Final diagnoses:  Acute cystitis without hematuria  Fall, initial encounter  Dementia, unspecified dementia severity, unspecified dementia type, unspecified whether behavioral, psychotic, or mood disturbance or anxiety (HCC)     Rx / DC Orders   ED Discharge Orders          Ordered    cephALEXin (KEFLEX) 500 MG capsule  4 times daily        12/19/21 1246             Note:  This document was prepared using Dragon voice recognition software and may include unintentional dictation errors.    Gilles Chiquito, MD 12/19/21 1251

## 2021-12-19 NOTE — ED Triage Notes (Signed)
Pt arrives via EMS from facility. Per staff had fall yesterday that was not reported to staff. Pt alert and oriented x 4. States yesterday was getting out of bed and slid to floor.Skin tear to right calf, pt denies pain. Son at bedside.

## 2021-12-19 NOTE — ED Notes (Signed)
Cleaned skin tear to RLE and placed non-adherent bandage.

## 2021-12-20 ENCOUNTER — Other Ambulatory Visit: Payer: Self-pay

## 2021-12-20 ENCOUNTER — Encounter: Payer: Self-pay | Admitting: Orthopedic Surgery

## 2021-12-20 ENCOUNTER — Inpatient Hospital Stay
Admission: EM | Admit: 2021-12-20 | Discharge: 2021-12-24 | DRG: 521 | Disposition: A | Discharge status: Skilled Nursing Facility | Payer: Medicare Other | Source: Ambulatory Visit | Attending: Internal Medicine | Admitting: Internal Medicine

## 2021-12-20 ENCOUNTER — Emergency Department: Payer: Medicare Other

## 2021-12-20 DIAGNOSIS — Z881 Allergy status to other antibiotic agents status: Secondary | ICD-10-CM

## 2021-12-20 DIAGNOSIS — N3000 Acute cystitis without hematuria: Secondary | ICD-10-CM | POA: Diagnosis present

## 2021-12-20 DIAGNOSIS — Z888 Allergy status to other drugs, medicaments and biological substances status: Secondary | ICD-10-CM

## 2021-12-20 DIAGNOSIS — B961 Klebsiella pneumoniae [K. pneumoniae] as the cause of diseases classified elsewhere: Secondary | ICD-10-CM | POA: Diagnosis present

## 2021-12-20 DIAGNOSIS — W06XXXA Fall from bed, initial encounter: Secondary | ICD-10-CM | POA: Diagnosis present

## 2021-12-20 DIAGNOSIS — Z20822 Contact with and (suspected) exposure to covid-19: Secondary | ICD-10-CM | POA: Diagnosis present

## 2021-12-20 DIAGNOSIS — I10 Essential (primary) hypertension: Secondary | ICD-10-CM | POA: Diagnosis present

## 2021-12-20 DIAGNOSIS — S72002A Fracture of unspecified part of neck of left femur, initial encounter for closed fracture: Principal | ICD-10-CM | POA: Diagnosis present

## 2021-12-20 DIAGNOSIS — M25551 Pain in right hip: Secondary | ICD-10-CM | POA: Diagnosis present

## 2021-12-20 DIAGNOSIS — G309 Alzheimer's disease, unspecified: Secondary | ICD-10-CM | POA: Diagnosis present

## 2021-12-20 DIAGNOSIS — K219 Gastro-esophageal reflux disease without esophagitis: Secondary | ICD-10-CM | POA: Diagnosis present

## 2021-12-20 DIAGNOSIS — F419 Anxiety disorder, unspecified: Secondary | ICD-10-CM | POA: Diagnosis present

## 2021-12-20 DIAGNOSIS — Y92129 Unspecified place in nursing home as the place of occurrence of the external cause: Secondary | ICD-10-CM

## 2021-12-20 DIAGNOSIS — F0280 Dementia in other diseases classified elsewhere without behavioral disturbance: Secondary | ICD-10-CM | POA: Diagnosis present

## 2021-12-20 DIAGNOSIS — J9601 Acute respiratory failure with hypoxia: Secondary | ICD-10-CM | POA: Diagnosis present

## 2021-12-20 DIAGNOSIS — F05 Delirium due to known physiological condition: Secondary | ICD-10-CM | POA: Diagnosis not present

## 2021-12-20 DIAGNOSIS — Z1611 Resistance to penicillins: Secondary | ICD-10-CM | POA: Diagnosis present

## 2021-12-20 DIAGNOSIS — Z96652 Presence of left artificial knee joint: Secondary | ICD-10-CM | POA: Diagnosis present

## 2021-12-20 DIAGNOSIS — Z7982 Long term (current) use of aspirin: Secondary | ICD-10-CM | POA: Diagnosis not present

## 2021-12-20 DIAGNOSIS — I447 Left bundle-branch block, unspecified: Secondary | ICD-10-CM | POA: Diagnosis present

## 2021-12-20 DIAGNOSIS — F03C0 Unspecified dementia, severe, without behavioral disturbance, psychotic disturbance, mood disturbance, and anxiety: Secondary | ICD-10-CM | POA: Diagnosis present

## 2021-12-20 DIAGNOSIS — Z79899 Other long term (current) drug therapy: Secondary | ICD-10-CM | POA: Diagnosis not present

## 2021-12-20 DIAGNOSIS — N390 Urinary tract infection, site not specified: Secondary | ICD-10-CM | POA: Diagnosis present

## 2021-12-20 DIAGNOSIS — Z885 Allergy status to narcotic agent status: Secondary | ICD-10-CM

## 2021-12-20 DIAGNOSIS — S72001A Fracture of unspecified part of neck of right femur, initial encounter for closed fracture: Principal | ICD-10-CM

## 2021-12-20 LAB — CBC WITH DIFFERENTIAL/PLATELET
Abs Immature Granulocytes: 0.05 10*3/uL (ref 0.00–0.07)
Basophils Absolute: 0 10*3/uL (ref 0.0–0.1)
Basophils Relative: 0 %
Eosinophils Absolute: 0.1 10*3/uL (ref 0.0–0.5)
Eosinophils Relative: 1 %
HCT: 44.2 % (ref 36.0–46.0)
Hemoglobin: 14.1 g/dL (ref 12.0–15.0)
Immature Granulocytes: 1 %
Lymphocytes Relative: 8 %
Lymphs Abs: 0.7 10*3/uL (ref 0.7–4.0)
MCH: 27.8 pg (ref 26.0–34.0)
MCHC: 31.9 g/dL (ref 30.0–36.0)
MCV: 87 fL (ref 80.0–100.0)
Monocytes Absolute: 0.7 10*3/uL (ref 0.1–1.0)
Monocytes Relative: 8 %
Neutro Abs: 7.3 10*3/uL (ref 1.7–7.7)
Neutrophils Relative %: 82 %
Platelets: 142 10*3/uL — ABNORMAL LOW (ref 150–400)
RBC: 5.08 MIL/uL (ref 3.87–5.11)
RDW: 13.6 % (ref 11.5–15.5)
WBC: 8.8 10*3/uL (ref 4.0–10.5)
nRBC: 0 % (ref 0.0–0.2)

## 2021-12-20 LAB — BASIC METABOLIC PANEL
Anion gap: 8 (ref 5–15)
BUN: 28 mg/dL — ABNORMAL HIGH (ref 8–23)
CO2: 25 mmol/L (ref 22–32)
Calcium: 9.9 mg/dL (ref 8.9–10.3)
Chloride: 107 mmol/L (ref 98–111)
Creatinine, Ser: 1.17 mg/dL — ABNORMAL HIGH (ref 0.44–1.00)
GFR, Estimated: 43 mL/min — ABNORMAL LOW (ref >60)
Glucose, Bld: 107 mg/dL — ABNORMAL HIGH (ref 70–99)
Potassium: 3.6 mmol/L (ref 3.5–5.1)
Sodium: 140 mmol/L (ref 135–145)

## 2021-12-20 LAB — PROCALCITONIN: Procalcitonin: <0.1 ng/mL

## 2021-12-20 LAB — SARS CORONAVIRUS 2 BY RT PCR: SARS Coronavirus 2 by RT PCR: NEGATIVE

## 2021-12-20 MED ORDER — OXYCODONE-ACETAMINOPHEN 5-325 MG PO TABS
1.0000 | ORAL_TABLET | Freq: Four times a day (QID) | ORAL | Status: DC | PRN
  Administered 2021-12-21: 1 via ORAL
  Filled 2021-12-20 (×2): qty 1

## 2021-12-20 MED ORDER — SODIUM CHLORIDE 0.9 % IV SOLN: 500.0000 mg | INTRAVENOUS | Status: DC

## 2021-12-20 MED ORDER — HEPARIN SODIUM (PORCINE) 5000 UNIT/ML IJ SOLN
5000.0000 [IU] | Freq: Three times a day (TID) | INTRAMUSCULAR | Status: AC
  Administered 2021-12-20: 5000 [IU] via SUBCUTANEOUS
  Filled 2021-12-20: qty 1

## 2021-12-20 MED ORDER — ONDANSETRON HCL 4 MG/2ML IJ SOLN: 4.0000 mg | Freq: Four times a day (QID) | INTRAMUSCULAR | Status: DC | PRN

## 2021-12-20 MED ORDER — SENNOSIDES-DOCUSATE SODIUM 8.6-50 MG PO TABS
1.0000 | ORAL_TABLET | Freq: Every evening | ORAL | Status: DC | PRN
  Filled 2021-12-20 (×2): qty 1

## 2021-12-20 MED ORDER — ACETAMINOPHEN 325 MG PO TABS: 650.0000 mg | ORAL_TABLET | Freq: Four times a day (QID) | ORAL | Status: DC | PRN

## 2021-12-20 MED ORDER — HYDRALAZINE HCL 20 MG/ML IJ SOLN: 5.0000 mg | Freq: Four times a day (QID) | INTRAMUSCULAR | Status: DC | PRN

## 2021-12-20 MED ORDER — ONDANSETRON HCL 4 MG PO TABS: 4.0000 mg | ORAL_TABLET | Freq: Four times a day (QID) | ORAL | Status: DC | PRN

## 2021-12-20 MED ORDER — ACETAMINOPHEN 650 MG RE SUPP: 650.0000 mg | Freq: Four times a day (QID) | RECTAL | Status: DC | PRN

## 2021-12-20 MED ORDER — RISPERIDONE 0.25 MG PO TABS
0.2500 mg | ORAL_TABLET | Freq: Two times a day (BID) | ORAL | Status: DC
  Administered 2021-12-20 – 2021-12-24 (×7): 0.25 mg via ORAL
  Filled 2021-12-20 (×8): qty 1

## 2021-12-20 MED ORDER — CHLORHEXIDINE GLUCONATE CLOTH 2 % EX PADS
6.0000 | MEDICATED_PAD | Freq: Every day | CUTANEOUS | Status: DC
  Administered 2021-12-21 (×2): 6 via TOPICAL

## 2021-12-20 MED ORDER — SODIUM CHLORIDE 0.9 % IV SOLN
1.0000 g | INTRAVENOUS | Status: DC
  Filled 2021-12-20: qty 10

## 2021-12-20 MED ORDER — ALPRAZOLAM 0.25 MG PO TABS
0.2500 mg | ORAL_TABLET | Freq: Two times a day (BID) | ORAL | Status: DC | PRN
  Administered 2021-12-20 – 2021-12-23 (×3): 0.25 mg via ORAL
  Filled 2021-12-20 (×3): qty 1

## 2021-12-20 MED ORDER — MORPHINE SULFATE (PF) 2 MG/ML IV SOLN
1.0000 mg | INTRAVENOUS | Status: AC | PRN
  Administered 2021-12-20 – 2021-12-22 (×4): 1 mg via INTRAVENOUS
  Filled 2021-12-20 (×4): qty 1

## 2021-12-20 MED ORDER — CEFAZOLIN SODIUM-DEXTROSE 1-4 GM/50ML-% IV SOLN
1.0000 g | Freq: Once | INTRAVENOUS | Status: AC
  Administered 2021-12-21: 1 g via INTRAVENOUS
  Filled 2021-12-20: qty 50

## 2021-12-20 NOTE — Assessment & Plan Note (Signed)
-   Etiology work-up in progress, query secondary to pain.  Procalcitonin negative -Continue to monitor -Continue with supplemental oxygen as patient just had her surgery, wean as tolerated

## 2021-12-20 NOTE — Assessment & Plan Note (Addendum)
-   Present on admission Urine culture grew Klebsiella pneumonia which shows resistance only to ampicillin.  Patient was on ceftriaxone. -Switch ceftriaxone with cefazolin-can placed on Keflex from tomorrow

## 2021-12-20 NOTE — ED Triage Notes (Signed)
Pt arrives to ED from home via PoV with family with c/c of fall this morning. Pt was seen yesterday and Dx with right hip fracture. Pain is severe now after 2nd fall. Pt is A&Ox4, movement from wheelchair to bed was extremely painful. O2 sats at 88% with initial assessment.Distal pulses intact. Pt denies hitting head today.

## 2021-12-20 NOTE — ED Notes (Signed)
Pt resting in bed at this time, daughter at bedside.

## 2021-12-20 NOTE — Progress Notes (Signed)
[employer] has history of becoming combative with anxiety and is ion antianxiety regimen which has been ordered from home meds  Patient has advanced directives and hard copy in chart. Reviewed with POA at bedside and confirmed patient is full code but should she have incurable illness she would not want to suffer or kept alive by extraordinary means  Procal >0.10, no change in antibiotics

## 2021-12-20 NOTE — H&P (Addendum)
History and Physical   Faith Vasquez OQH:[id number] DOB: [birth year] DOA: 12/20/2021  PCP: Enid Baas, MD  Outpatient Specialists: Dr. Rosita Kea Patient coming from: [facility] urgent care  I have personally briefly reviewed patient's old medical records in [facility] EMR.  Chief Concern: falling  HPI: [name] is a 88 year old with memory impairment, Alzheimer's, hypertension, who presents to the ED for chief concern of fall and possible right hip failure.   Initial temperature is pending, respiration rate of 22, heart rate of 77, blood pressure 155/82, spO2 of 90% on 2 L [place].   Serum sodium is 140, potassium 2.6, chloride 107, bicarb 25, BUN of 28, serum creatinine 1.17, nonfasting blood glucose 107, WBC 8.8, hemoglobin 14.1, platelets of 142.  GFR 43.  UA was positive for large leukocytes and nitrates on 12/19/2021.  ED treatment: Cefazolin 1 g IV  She was seen in the ED on 12/20/21, for a fall and was able to ambulate in the ED and thus discharged from the ED. Per report, she fell again on 12/20/21 and was seen at [facility] and was found to have right hip fracture. She was sent to [facility] ED for chief concern of acute hypoxic respiratory at 87% on room air.   At bedside patient was able to tell me her full name, her age, the current calendar year and she knows she is in the hospital.  She was able to identify her friend [name] at bedside.  She reports that she is here in the hospital because she needs her hip fixed.  She did not remember falling on day of admission.  She denies chest pain, shortness of breath, abdominal pain, hematuria, diarrhea, new cough, fever, chills.  She did endorse dysuria with urination.  Social history: She lives at [facility].  She denies history of tobacco, EtOH, recreational drug use.  She is retired and formerly was a Futures trader.  ROS: Constitutional: no weight change, no fever ENT/Mouth: no sore throat, no rhinorrhea Eyes: no  eye pain, no vision changes Cardiovascular: no chest pain, no dyspnea,  no edema, no palpitations Respiratory: no cough, no sputum, no wheezing Gastrointestinal: no nausea, no vomiting, no diarrhea, no constipation Genitourinary: no urinary incontinence, + dysuria, no hematuria Musculoskeletal: no arthralgias, no myalgias Skin: no skin lesions, no pruritus, Neuro: + weakness, no loss of consciousness, no syncope Psych: no anxiety, no depression, + decrease appetite Heme/Lymph: no bruising, no bleeding  ED Course: Discussed with emergency medicine provider, patient requiring hospitalization due to concerns of acute hypoxic respiratory failure and right hip fracture diagnosed at [facility] urgent care center.  Assessment/Plan  Principal Problem:   Acute hypoxemic respiratory failure (HCC) Active Problems:   GERD (gastroesophageal reflux disease)   Essential hypertension   Advanced dementia (HCC)   Right hip pain   UTI (urinary tract infection)   Assessment and Plan:  * Acute hypoxemic respiratory failure (HCC) - Etiology work-up in progress, query secondary to pain - Check procalcitonin - Check EKG - Admit to telemetry medical,  UTI (urinary tract infection) - Present on admission - Cefazolin 1g IV in the ED - Ceftriaxone 1 g IV daily starting on 12/21/2021, 4 doses with  Right hip pain Subcapital fracture with slight displacement and severe osteopenia - Was diagnosed with right hip fracture at [facility] clinic - I do not have access to x-ray and/or read from urgent clinic - Per EDP, [facility] has already consulted Dr. Rosita Kea who is aware and will  see the patient - Pain control with oxycodone-acetaminophen 5-325 mg tablets, 1 tablet every 6 hours as needed for moderate pain, 3 days ordered - Morphine 1 mg IV every 4 hours as needed for severe pain, 2 days ordered - AM team to reassess patient at bedside and determine continued opioid medication requirement - N.p.o.  after midnight except for sips of meds  Essential hypertension - Hydralazine 5 mg IV every 6 hours as needed for SBP greater than 180, 4 days ordered  Temperature has not been recorded and vitals-I message nursing staff to check and upload to epic, this was acknowledged and confirmed that it will be done by nursing staff  DVT prophylaxis-I have ordered heparin 5000 units subcutaneous one-time dose on admission.  I did not continue due to anticipation of OR with orthopedic provider on 12/21/2021 - AM team to initiate pharmacologic DVT prophylaxis when the benefits outweigh the risk  Chart reviewed.   DVT prophylaxis: Heparin 5000 units subcutaneous, one-time dose Code Status: Full code Diet: Heart healthy Family Communication: Updated friend, [name] at bedside with patient's permission Disposition Plan: Pending clinical course Consults called: Orthopedic Admission status: Telemetry medical, inpatient  Past Medical History:  Diagnosis Date   Diverticulosis    History of Clostridium difficile infection    uncertain dates, possibly in 2017 or 2018   Past Surgical History:  Procedure Laterality Date   ABDOMINAL HYSTERECTOMY     CHOLECYSTECTOMY     HIP ARTHROPLASTY Left 03/25/2020   Procedure: ARTHROPLASTY BIPOLAR HIP (HEMIARTHROPLASTY);  Surgeon: Kennedy Bucker, MD;  Location: [facility] ORS;  Service: Orthopedics;  Laterality: Left;   Social History:  reports that she has never smoked. She has never used smokeless tobacco. She reports that she does not drink alcohol and does not use drugs.  Allergies  Allergen Reactions   Codeine    Iodine    Macrobid [Nitrofurantoin] Other (See Comments)    Unknown reaction.   Ciprofloxacin Rash    Other reaction(s): whelps   History reviewed. No pertinent family history. Family history: Family history reviewed and not pertinent  Prior to Admission medications   Medication Sig Start Date End Date Taking? Authorizing Provider  acetaminophen  (TYLENOL) 325 MG tablet Take 1 tablet (325 mg total) by mouth 3 (three) times daily. 09/23/21   Leeroy Bock, MD  ALPRAZolam Prudy Feeler) 0.25 MG tablet Take 0.25 mg by mouth 2 (two) times daily as needed for anxiety.    [provider]  aspirin 81 MG EC tablet Chew 81 mg by mouth daily.    [provider]  cephALEXin (KEFLEX) 500 MG capsule Take 1 capsule (500 mg total) by mouth 4 (four) times daily for 7 days. 12/19/21 12/26/21  Gilles Chiquito, MD  Cholecalciferol (VITAMIN D3) 50 MCG (2000 UT) TABS Take 1 capsule by mouth daily. Wednesday 03/22/20   [provider]  feeding supplement (ENSURE ENLIVE / ENSURE PLUS) LIQD Take 237 mLs by mouth 2 (two) times daily between meals. 03/27/20   Darlin Priestly, MD  guaifenesin (HUMIBID E) 400 MG TABS tablet Take 400 mg by mouth every 4 (four) hours as needed.    [provider]  ondansetron (ZOFRAN) 4 MG tablet Take 4 mg by mouth every 6 (six) hours as needed for nausea or vomiting.    [provider]  polyethylene glycol (MIRALAX / GLYCOLAX) 17 g packet Take 17 g by mouth 2 (two) times daily. Patient taking differently: Take 17 g by mouth 3 (three) times a  week. Monday, Wednesday, and Friday. 03/27/20   Darlin Priestly, MD   Physical Exam: Vitals:   12/20/21 1624 12/20/21 1625 12/20/21 1713 12/20/21 1737  BP: (!) 155/82   (!) 154/89  Pulse: 77   93  Resp: (!) 22   20  SpO2: 90%  93% 92%  Weight:  62.9 kg    Height:  5\' 5"  (1.651 m)     Constitutional: appears age-appropriate, NAD, calm, comfortable Eyes: PERRL, lids and conjunctivae normal ENMT: Mucous membranes are moist. Posterior pharynx clear of any exudate or lesions. Age-appropriate dentition.  Mild to moderate bilateral hearing loss Neck: normal, supple, no masses, no thyromegaly Respiratory: clear to auscultation bilaterally, no wheezing, no crackles. Normal respiratory effort. No accessory muscle use.  Cardiovascular: Regular rate and rhythm, no murmurs /  rubs / gallops. No extremity edema. 2+ pedal pulses. No carotid bruits.  Abdomen: no tenderness, no masses palpated, no hepatosplenomegaly. Bowel sounds positive.  Musculoskeletal: no clubbing / cyanosis. No joint deformity upper and lower extremities. Good ROM, no contractures, no atrophy. Normal muscle tone.  Skin: no rashes, lesions, ulcers. No induration Neurologic: Sensation intact. Strength 5/5 in all 4.  Psychiatric: Normal judgment and insight. Alert and oriented x 3. Normal mood.   EKG: Ordered Chest x-ray on Admission: I personally reviewed and I agree with radiologist reading as below.  DG Chest Portable 1 View  Result Date: 12/20/2021 CLINICAL DATA:  Hypoxia, known hip fracture EXAM: PORTABLE CHEST 1 VIEW COMPARISON:  12/19/2021 FINDINGS: Scattered interstitial opacities, right lower lobe predominant, favoring scarring when correlating with prior. No focal consolidation. No pleural effusion or pneumothorax. The heart is normal in size.  Thoracic aortic atherosclerosis. IMPRESSION: No evidence of acute cardiopulmonary disease. Electronically Signed   By: 12/21/2021 M.D.   On: 12/20/2021 17:02   DG Chest Portable 1 View  Result Date: 12/19/2021 CLINICAL DATA:  Tachypnea EXAM: PORTABLE CHEST 1 VIEW COMPARISON:  09/19/2021 FINDINGS: Stable cardiomediastinal contours. Aortic atherosclerosis. Low lung volumes. Bibasilar interstitial opacities, not appreciably changed from prior. No pleural effusion or pneumothorax. IMPRESSION: Low lung volumes with bibasilar interstitial opacities, not appreciably changed from prior. Electronically Signed   By: 09/21/2021 D.O.   On: 12/19/2021 11:16   CT HEAD WO CONTRAST (12/21/2021)  Result Date: 12/19/2021 CLINICAL DATA:  Head trauma, minor (Age >= 65y); Neck trauma (Age >= 65y) EXAM: CT HEAD WITHOUT CONTRAST CT CERVICAL SPINE WITHOUT CONTRAST TECHNIQUE: Multidetector CT imaging of the head and cervical spine was performed following the standard  protocol without intravenous contrast. Multiplanar CT image reconstructions of the cervical spine were also generated. RADIATION DOSE REDUCTION: This exam was performed according to the departmental dose-optimization program which includes automated exposure control, adjustment of the mA and/or kV according to patient size and/or use of iterative reconstruction technique. COMPARISON:  August 13, 2018. FINDINGS: CT HEAD FINDINGS Brain: No evidence of acute infarction, hemorrhage, hydrocephalus, extra-axial collection or mass lesion/mass effect. Chronic microvascular disease. Vascular: No hyperdense vessel identified. Skull: No acute fracture. Sinuses/Orbits: Clear sinuses.  No acute orbital findings. Other: No mastoid effusions. CT CERVICAL SPINE FINDINGS Alignment: Mild reversal the normal cervical lordosis. Mild anterolisthesis of C2 on C3 and C3 on C4. Skull base and vertebrae: Vertebral heights are maintained. No evidence of acute fracture. Soft tissues and spinal canal: No prevertebral fluid or swelling. No visible canal hematoma. Disc levels: Severe multilevel degenerative disease, greatest at C4-C5 C5-C6 and C6-C7. Multilevel facet and uncovertebral hypertrophy with varying degrees of neural foraminal  stenosis. Upper chest: Biapical pleuroparenchymal scarring. IMPRESSION: 1. No evidence of acute intracranial abnormality. 2. No evidence of acute fracture or traumatic malalignment in the cervical spine. Electronically Signed   By: Feliberto Harts M.D.   On: 12/19/2021 09:32   CT Cervical Spine Wo Contrast  Result Date: 12/19/2021 CLINICAL DATA:  Head trauma, minor (Age >= 65y); Neck trauma (Age >= 65y) EXAM: CT HEAD WITHOUT CONTRAST CT CERVICAL SPINE WITHOUT CONTRAST TECHNIQUE: Multidetector CT imaging of the head and cervical spine was performed following the standard protocol without intravenous contrast. Multiplanar CT image reconstructions of the cervical spine were also generated. RADIATION DOSE  REDUCTION: This exam was performed according to the departmental dose-optimization program which includes automated exposure control, adjustment of the mA and/or kV according to patient size and/or use of iterative reconstruction technique. COMPARISON:  August 13, 2018. FINDINGS: CT HEAD FINDINGS Brain: No evidence of acute infarction, hemorrhage, hydrocephalus, extra-axial collection or mass lesion/mass effect. Chronic microvascular disease. Vascular: No hyperdense vessel identified. Skull: No acute fracture. Sinuses/Orbits: Clear sinuses.  No acute orbital findings. Other: No mastoid effusions. CT CERVICAL SPINE FINDINGS Alignment: Mild reversal the normal cervical lordosis. Mild anterolisthesis of C2 on C3 and C3 on C4. Skull base and vertebrae: Vertebral heights are maintained. No evidence of acute fracture. Soft tissues and spinal canal: No prevertebral fluid or swelling. No visible canal hematoma. Disc levels: Severe multilevel degenerative disease, greatest at C4-C5 C5-C6 and C6-C7. Multilevel facet and uncovertebral hypertrophy with varying degrees of neural foraminal stenosis. Upper chest: Biapical pleuroparenchymal scarring. IMPRESSION: 1. No evidence of acute intracranial abnormality. 2. No evidence of acute fracture or traumatic malalignment in the cervical spine. Electronically Signed   By: Feliberto Harts M.D.   On: 12/19/2021 09:32    Labs on Admission: I have personally reviewed following labs  CBC: Recent Labs  Lab 12/19/21 0857 12/20/21 1646  WBC 8.6 8.8  NEUTROABS  --  7.3  HGB 13.0 14.1  HCT 40.6 44.2  MCV 86.4 87.0  PLT 145* 142*   Basic Metabolic Panel: Recent Labs  Lab 12/19/21 0857 12/20/21 1646  NA 141 140  K 3.8 3.6  CL 111 107  CO2 24 25  GLUCOSE 106* 107*  BUN 21 28*  CREATININE 1.03* 1.17*  CALCIUM 9.7 9.9   GFR: Estimated Creatinine Clearance: 25.3 mL/min (A) (by C-G formula based on SCr of 1.17 mg/dL (H)).  Thyroid Function Tests: Recent Labs     12/19/21 0857  TSH 1.430   Urine analysis:    Component Value Date/Time   COLORURINE YELLOW (A) 12/19/2021 0857   APPEARANCEUR HAZY (A) 12/19/2021 0857   LABSPEC 1.019 12/19/2021 0857   PHURINE 5.0 12/19/2021 0857   GLUCOSEU NEGATIVE 12/19/2021 0857   HGBUR NEGATIVE 12/19/2021 0857   BILIRUBINUR NEGATIVE 12/19/2021 0857   KETONESUR NEGATIVE 12/19/2021 0857   PROTEINUR NEGATIVE 12/19/2021 0857   NITRITE POSITIVE (A) 12/19/2021 0857   LEUKOCYTESUR LARGE (A) 12/19/2021 0857   Dr. Sedalia Muta [facility]  If 7PM-7AM, please contact overnight-coverage provider If 7AM-7PM, please contact day coverage provider [email or web address]  12/20/2021, 7:10 PM

## 2021-12-20 NOTE — Assessment & Plan Note (Addendum)
Blood pressure within normal limit, at times becoming soft. -Keep holding home amlodipine - Hydralazine 5 mg IV every 6 hours as needed for SBP greater than 180, 4 days ordered

## 2021-12-20 NOTE — Consult Note (Signed)
Reason for Consult: Right hip fracture Referring Physician: ER staff  Faith Vasquez is an 88 y.o. female.  HPI: Patient is a 88 year old who suffered a fall and was confused yesterday and came to the ER was diagnosed with UTI.  She was having pain today and her family brought her in from [place] to our walk-in clinic at [facility] clinic and she is found to have a femoral neck fracture.  She is referred to ER for definitive care.  I have taken care of her in the past she is home ambulator with a walker.  Past Medical History:  Diagnosis Date   Diverticulosis    History of Clostridium difficile infection    uncertain dates, possibly in 2017 or 2018    Past Surgical History:  Procedure Laterality Date   ABDOMINAL HYSTERECTOMY     CHOLECYSTECTOMY     HIP ARTHROPLASTY Left 03/25/2020   Procedure: ARTHROPLASTY BIPOLAR HIP (HEMIARTHROPLASTY);  Surgeon: Kennedy Bucker, MD;  Location: [facility] ORS;  Service: Orthopedics;  Laterality: Left;    History reviewed. No pertinent family history.  Social History:  reports that she has never smoked. She has never used smokeless tobacco. She reports that she does not drink alcohol and does not use drugs.  Allergies:  Allergies  Allergen Reactions   Codeine    Iodine    Macrobid [Nitrofurantoin] Other (See Comments)    Unknown reaction.   Ciprofloxacin Rash    Other reaction(s): whelps    Medications: I have reviewed the patient's current medications.  Results for orders placed or performed during the hospital encounter of 12/19/21 (from the past 48 hour(s))  Basic metabolic panel     Status: Abnormal   Collection Time: 12/19/21  8:57 AM  Result Value Ref Range   Sodium 141 135 - 145 mmol/L   Potassium 3.8 3.5 - 5.1 mmol/L   Chloride 111 98 - 111 mmol/L   CO2 24 22 - 32 mmol/L   Glucose, Bld 106 (H) 70 - 99 mg/dL    Comment: Glucose reference range applies only to samples taken after fasting for at least 8 hours.   BUN 21 8 - 23 mg/dL    Creatinine, Ser 0.86 (H) 0.44 - 1.00 mg/dL   Calcium 9.7 8.9 - 57.8 mg/dL   GFR, Estimated 50 (L) >60 mL/min    Comment: (NOTE) Calculated using the CKD-EPI Creatinine Equation (2021)    Anion gap 6 5 - 15    Comment: Performed at [facility], [address] [place], [place] [address]  CBC     Status: Abnormal   Collection Time: 12/19/21  8:57 AM  Result Value Ref Range   WBC 8.6 4.0 - 10.5 K/uL   RBC 4.70 3.87 - 5.11 MIL/uL   Hemoglobin 13.0 12.0 - 15.0 g/dL   HCT 95.2 84.1 - 32.4 %   MCV 86.4 80.0 - 100.0 fL   MCH 27.7 26.0 - 34.0 pg   MCHC 32.0 30.0 - 36.0 g/dL   RDW 40.1 02.7 - 25.3 %   Platelets 145 (L) 150 - 400 K/uL   nRBC 0.0 0.0 - 0.2 %    Comment: Performed at [facility], [address] [place], [place] [address]  Troponin I (High Sensitivity)     Status: None   Collection Time: 12/19/21  8:57 AM  Result Value Ref Range   Troponin I (High Sensitivity) 6 <18 ng/L    Comment: (NOTE) Elevated high sensitivity troponin I (hsTnI) values and  significant  changes across serial measurements may suggest ACS but many other  chronic and acute conditions are known to elevate hsTnI results.  Refer to the "Links" section for chest pain algorithms and additional  guidance. Performed at [facility], [address] [place], [place] [address]   Urinalysis, Complete w Microscopic Urine, Catheterized     Status: Abnormal   Collection Time: 12/19/21  8:57 AM  Result Value Ref Range   Color, Urine YELLOW (A) YELLOW   APPearance HAZY (A) CLEAR   Specific Gravity, Urine 1.019 1.005 - 1.030   pH 5.0 5.0 - 8.0   Glucose, UA NEGATIVE NEGATIVE mg/dL   Hgb urine dipstick NEGATIVE NEGATIVE   Bilirubin Urine NEGATIVE NEGATIVE   Ketones, ur NEGATIVE NEGATIVE mg/dL   Protein, ur NEGATIVE NEGATIVE mg/dL   Nitrite POSITIVE (A) NEGATIVE   Leukocytes,Ua LARGE (A) NEGATIVE   RBC / HPF 0-5 0 - 5 RBC/hpf   WBC, UA >50 (H) 0 - 5 WBC/hpf   Bacteria, UA MANY  (A) NONE SEEN   Squamous Epithelial / LPF 0-5 0 - 5   WBC Clumps PRESENT    Mucus PRESENT     Comment: Performed at [facility], [address] [place], [place] [address]  TSH     Status: None   Collection Time: 12/19/21  8:57 AM  Result Value Ref Range   TSH 1.430 0.350 - 4.500 uIU/mL    Comment: Performed by a 3rd Generation assay with a functional sensitivity of <=0.01 uIU/mL. Performed at [facility], [address] [place], [place] [address]   Urine Culture     Status: Abnormal (Preliminary result)   Collection Time: 12/19/21  8:57 AM   Specimen: Urine, Catheterized  Result Value Ref Range   Specimen Description      URINE, CATHETERIZED Performed at [facility], [address] [place], [place] [address]    Special Requests      NONE Performed at [facility], [address] [place], [place] [address]    Culture (A)     >=100,000 COLONIES/mL GRAM NEGATIVE RODS SUSCEPTIBILITIES TO FOLLOW Performed at [facility], [address], [place], [place] [address]    Report Status PENDING     DG Chest Portable 1 View  Result Date: 12/19/2021 CLINICAL DATA:  Tachypnea EXAM: PORTABLE CHEST 1 VIEW COMPARISON:  09/19/2021 FINDINGS: Stable cardiomediastinal contours. Aortic atherosclerosis. Low lung volumes. Bibasilar interstitial opacities, not appreciably changed from prior. No pleural effusion or pneumothorax. IMPRESSION: Low lung volumes with bibasilar interstitial opacities, not appreciably changed from prior. Electronically Signed   By: Duanne Guess D.O.   On: 12/19/2021 11:16   CT HEAD WO CONTRAST ( )  Result Date: 12/19/2021 CLINICAL DATA:  Head trauma, minor (Age >= 65y); Neck trauma (Age >= 65y) EXAM: CT HEAD WITHOUT CONTRAST CT CERVICAL SPINE WITHOUT CONTRAST TECHNIQUE: Multidetector CT imaging of the head and cervical spine was performed following the standard protocol without intravenous contrast. Multiplanar CT image  reconstructions of the cervical spine were also generated. RADIATION DOSE REDUCTION: This exam was performed according to the departmental dose-optimization program which includes automated exposure control, adjustment of the mA and/or kV according to patient size and/or use of iterative reconstruction technique. COMPARISON:  August 13, 2018. FINDINGS: CT HEAD FINDINGS Brain: No evidence of acute infarction, hemorrhage, hydrocephalus, extra-axial collection or mass lesion/mass effect. Chronic microvascular disease. Vascular: No hyperdense vessel identified. Skull: No acute fracture. Sinuses/Orbits: Clear sinuses.  No acute orbital findings. Other: No mastoid effusions. CT CERVICAL SPINE FINDINGS Alignment: Mild reversal the normal cervical lordosis. Mild anterolisthesis of C2 on C3 and C3 on C4. Skull base and vertebrae: Vertebral heights are maintained. No evidence of acute fracture. Soft tissues and spinal canal: No prevertebral fluid or swelling. No visible canal hematoma. Disc levels: Severe multilevel degenerative disease, greatest at C4-C5 C5-C6 and C6-C7. Multilevel facet and uncovertebral hypertrophy with varying degrees of neural foraminal stenosis. Upper chest: Biapical pleuroparenchymal scarring. IMPRESSION: 1. No evidence of acute intracranial abnormality. 2. No evidence of acute fracture or traumatic malalignment in the cervical spine. Electronically Signed   By: Feliberto Harts M.D.   On: 12/19/2021 09:32   CT Cervical Spine Wo Contrast  Result Date: 12/19/2021 CLINICAL DATA:  Head trauma, minor (Age >= 65y); Neck trauma (Age >= 65y) EXAM: CT HEAD WITHOUT CONTRAST CT CERVICAL SPINE WITHOUT CONTRAST TECHNIQUE: Multidetector CT imaging of the head and cervical spine was performed following the standard protocol without intravenous contrast. Multiplanar CT image reconstructions of the cervical spine were also generated. RADIATION DOSE REDUCTION: This exam was performed according to the departmental  dose-optimization program which includes automated exposure control, adjustment of the mA and/or kV according to patient size and/or use of iterative reconstruction technique. COMPARISON:  August 13, 2018. FINDINGS: CT HEAD FINDINGS Brain: No evidence of acute infarction, hemorrhage, hydrocephalus, extra-axial collection or mass lesion/mass effect. Chronic microvascular disease. Vascular: No hyperdense vessel identified. Skull: No acute fracture. Sinuses/Orbits: Clear sinuses.  No acute orbital findings. Other: No mastoid effusions. CT CERVICAL SPINE FINDINGS Alignment: Mild reversal the normal cervical lordosis. Mild anterolisthesis of C2 on C3 and C3 on C4. Skull base and vertebrae: Vertebral heights are maintained. No evidence of acute fracture. Soft tissues and spinal canal: No prevertebral fluid or swelling. No visible canal hematoma. Disc levels: Severe multilevel degenerative disease, greatest at C4-C5 C5-C6 and C6-C7. Multilevel facet and uncovertebral hypertrophy with varying degrees of neural foraminal stenosis. Upper chest: Biapical pleuroparenchymal scarring. IMPRESSION: 1. No evidence of acute intracranial abnormality. 2. No evidence of acute fracture or traumatic malalignment in the cervical spine. Electronically Signed   By: Feliberto Harts M.D.   On: 12/19/2021 09:32   Radiographs from [facility] clinic reviewed and it shows a subcapital fracture with slight displacement and severe osteopenia.  Review of Systems Blood pressure (!) 155/82, pulse 77, resp. rate (!) 22, height 5\' 5"  (1.651 m), weight 62.9 kg, SpO2 90 %. Physical Exam Right leg is externally rotated with palpable pulses.  She is able flex extend the toes.  Skin is intact around the right hip Assessment/Plan: With her very poor bone quality and the fracture being so high medical condition is for definitive care using hemiprosthesis cemented.  That is what she had done on the other side and did well plan on doing it tomorrow if  medically stable.  12/20/2021, 4:34 PM

## 2021-12-20 NOTE — ED Notes (Signed)
Pt eating from dinner tray at this time, notified that she will not be able to eat or drink after midnight d/t scheduled surgery.  Pt able to take home meds brought from facility with help from daughter.  Ok ay to take all meds except blood thinners per EDP Derrill Kay.  Daughter and son remain at bedside.

## 2021-12-20 NOTE — Hospital Course (Addendum)
Faith Vasquez is a 88 year old with memory impairment, Alzheimer's, hypertension, who presents to the ED for chief concern of fall and possible right hip failure.   Initial temperature is pending, respiration rate of 22, heart rate of 77, blood pressure 155/82, spO2 of 90% on 2 L [place].   Serum sodium is 140, potassium 2.6, chloride 107, bicarb 25, BUN of 28, serum creatinine 1.17, nonfasting blood glucose 107, WBC 8.8, hemoglobin 14.1, platelets of 142.  GFR 43.  UA was positive for large leukocytes and nitrates on 12/19/2021.  ED treatment: Cefazolin 1 g IV  7/30: She was also found to have right femoral neck fracture secondary to mechanical fall. She was seen in the ED on 12/20/21, for a fall and was able to ambulate in the ED and thus discharged from the ED. Per report, she fell again on 12/20/21 and was seen at [facility] and was found to have right hip fracture. She was sent to [facility] ED for chief concern of acute hypoxic respiratory at 87% on room air.  Orthopedic was consulted and she had her hemiarthroplasty this morning. Preliminary urine cultures positive for Klebsiella pneumoniae with good sensitivity, only resistant to ampicillin, switch antibiotics to cefazolin.  Patient is high risk for deterioration and death based on age, comorbidities, recent hip fracture.  She is currently full code.  Palliative care consult.

## 2021-12-20 NOTE — ED Triage Notes (Signed)
Pt to ED via wheelchair from [facility] with c/o dx hip fracture. This RN also received phone call from [facility] staff who states that patient hypoxic on arrival to [facility] with O2 sats 87-89%.

## 2021-12-20 NOTE — Assessment & Plan Note (Addendum)
Subcapital fracture with slight displacement and severe osteopenia - Was diagnosed with right hip fracture at [facility] clinic - I do not have access to x-ray and/or read from urgent clinic - Per EDP, [facility] clinic has already consulted Dr. Rosita Kea who is aware and will see the patient - Pain control with oxycodone-acetaminophen 5-325 mg tablets, 1 tablet every 6 hours as needed for moderate pain, 3 days ordered - Morphine 1 mg IV every 4 hours as needed for severe pain, 2 days ordered - AM team to reassess patient at bedside and determine continued opioid medication requirement - N.p.o. after midnight except for sips of meds

## 2021-12-20 NOTE — ED Provider Notes (Signed)
[facility] Provider Note    Event Date/Time   First MD Initiated Contact with Patient 12/20/21 1621     (approximate)   History   Hip Injury   HPI  Faith Vasquez is a 88 y.o. female who presents to the emergency department today from urgent care due to right hip fracture.  Patient has had multiple falls recently.  Was in fact seen yesterday for a fall in the emergency department.  Family states the patient fell again today.  Was then complaining of right hip pain.  At urgent care they also noted patient was hypoxic.  Family denies any history of requiring additional oxygen.  No recent fevers.     Physical Exam   Triage Vital Signs: ED Triage Vitals  Enc Vitals Group     BP 12/20/21 1624 (!) 155/82     Pulse Rate 12/20/21 1624 77     Resp 12/20/21 1624 (!) 22     Temp --      Temp src --      SpO2 12/20/21 1624 90 %     Weight 12/20/21 1625 138 lb 10.7 oz (62.9 kg)     Height 12/20/21 1625 5\' 5"  (1.651 m)     Head Circumference --      Peak Flow --      Pain Score 12/20/21 1625 8     Pain Loc --      Pain Edu? --      Excl. in GC? --     Most recent vital signs: Vitals:   12/20/21 1624  BP: (!) 155/82  Pulse: 77  Resp: (!) 22  SpO2: 90%    General: Awake, alert, not completely oriented. CV:  Good peripheral perfusion. Regular rate and rhythm. Resp:  Normal effort.  Abd:  No distention.  Other:  Right leg shortened and externally rotated.   ED Results / Procedures / Treatments   Labs (all labs ordered are listed, but only abnormal results are displayed) Labs Reviewed  CBC WITH DIFFERENTIAL/PLATELET - Abnormal; Notable for the following components:      Result Value   Platelets 142 (*)    All other components within normal limits  BASIC METABOLIC PANEL - Abnormal; Notable for the following components:   Glucose, Bld 107 (*)    BUN 28 (*)    Creatinine, Ser 1.17 (*)    GFR, Estimated 43 (*)    All other components within  normal limits  SARS CORONAVIRUS 2 BY RT PCR  BASIC METABOLIC PANEL  PROCALCITONIN      RADIOLOGY I independently interpreted and visualized the CXR. My interpretation: No pneumonia. Radiology interpretation:  IMPRESSION:  No evidence of acute cardiopulmonary disease.       PROCEDURES:  Critical Care performed: No  Procedures   MEDICATIONS ORDERED IN ED: Medications  ceFAZolin (ANCEF) IVPB 1 g/50 mL premix (has no administration in time range)     IMPRESSION / MDM / ASSESSMENT AND PLAN / ED COURSE  I reviewed the triage vital signs and the nursing notes.                              Differential diagnosis includes, but is not limited to, hip fracture, pneumonia.  Patient's presentation is most consistent with acute presentation with potential threat to life or bodily function.   Patient presents to the emergency department today from urgent care after x-rays showed  a right hip fracture.  Patient did have a fall today.  Was also found to be slightly hypoxic on room air.  Chest x-ray here without any obvious pneumonia.  Dr. Rosita Kea with orthopedic surgery had already been contacted by clinic physician.  Discussed with Dr. Sedalia Muta with the hospital service will plan on admission.   FINAL CLINICAL IMPRESSION(S) / ED DIAGNOSES   Final diagnoses:  Closed fracture of right hip, initial encounter [facility])      Note:  This document was prepared using Dragon voice recognition software and may include unintentional dictation errors. d   Phineas Semen, MD 12/20/21 1919

## 2021-12-21 ENCOUNTER — Inpatient Hospital Stay: Payer: Medicare Other | Admitting: Anesthesiology

## 2021-12-21 ENCOUNTER — Encounter
Admission: EM | Disposition: A | Discharge status: Skilled Nursing Facility | Payer: Self-pay | Source: Home / Self Care | Attending: Internal Medicine

## 2021-12-21 ENCOUNTER — Inpatient Hospital Stay: Payer: Medicare Other

## 2021-12-21 ENCOUNTER — Other Ambulatory Visit: Payer: Self-pay

## 2021-12-21 ENCOUNTER — Encounter: Payer: Self-pay | Admitting: Internal Medicine

## 2021-12-21 ENCOUNTER — Inpatient Hospital Stay: Admission: RE | Admit: 2021-12-21 | Payer: Medicare Other | Source: Home / Self Care | Admitting: Orthopedic Surgery

## 2021-12-21 DIAGNOSIS — J9601 Acute respiratory failure with hypoxia: Secondary | ICD-10-CM | POA: Diagnosis not present

## 2021-12-21 HISTORY — PX: ANTERIOR APPROACH HEMI HIP ARTHROPLASTY: SHX6690

## 2021-12-21 LAB — CBC
HCT: 40.4 % (ref 36.0–46.0)
Hemoglobin: 12.8 g/dL (ref 12.0–15.0)
MCH: 27.5 pg (ref 26.0–34.0)
MCHC: 31.7 g/dL (ref 30.0–36.0)
MCV: 86.9 fL (ref 80.0–100.0)
Platelets: 117 10*3/uL — ABNORMAL LOW (ref 150–400)
RBC: 4.65 MIL/uL (ref 3.87–5.11)
RDW: 13.8 % (ref 11.5–15.5)
WBC: 7.5 10*3/uL (ref 4.0–10.5)
nRBC: 0 % (ref 0.0–0.2)

## 2021-12-21 LAB — URINE CULTURE: Culture: >=100000 — AB

## 2021-12-21 LAB — BASIC METABOLIC PANEL
Anion gap: 7 (ref 5–15)
BUN: 25 mg/dL — ABNORMAL HIGH (ref 8–23)
CO2: 25 mmol/L (ref 22–32)
Calcium: 9.5 mg/dL (ref 8.9–10.3)
Chloride: 106 mmol/L (ref 98–111)
Creatinine, Ser: 1.13 mg/dL — ABNORMAL HIGH (ref 0.44–1.00)
GFR, Estimated: 45 mL/min — ABNORMAL LOW (ref >60)
Glucose, Bld: 121 mg/dL — ABNORMAL HIGH (ref 70–99)
Potassium: 3.7 mmol/L (ref 3.5–5.1)
Sodium: 138 mmol/L (ref 135–145)

## 2021-12-21 LAB — CREATININE, SERUM
Creatinine, Ser: 1.05 mg/dL — ABNORMAL HIGH (ref 0.44–1.00)
GFR, Estimated: 49 mL/min — ABNORMAL LOW (ref >60)

## 2021-12-21 SURGERY — HEMIARTHROPLASTY, HIP, DIRECT ANTERIOR APPROACH, FOR FRACTURE
Anesthesia: Spinal | Laterality: Right

## 2021-12-21 MED ORDER — AMLODIPINE BESYLATE 5 MG PO TABS
5.0000 mg | ORAL_TABLET | Freq: Every day | ORAL | Status: DC
  Administered 2021-12-22 – 2021-12-24 (×3): 5 mg via ORAL
  Filled 2021-12-21 (×3): qty 1

## 2021-12-21 MED ORDER — DOCUSATE SODIUM 100 MG PO CAPS
100.0000 mg | ORAL_CAPSULE | Freq: Two times a day (BID) | ORAL | Status: DC
  Administered 2021-12-21 – 2021-12-23 (×5): 100 mg via ORAL
  Filled 2021-12-21 (×5): qty 1

## 2021-12-21 MED ORDER — ZOLPIDEM TARTRATE 5 MG PO TABS
5.0000 mg | ORAL_TABLET | Freq: Every evening | ORAL | Status: DC | PRN
  Administered 2021-12-21: 5 mg via ORAL
  Filled 2021-12-21 (×3): qty 1

## 2021-12-21 MED ORDER — PHENYLEPHRINE HCL (PRESSORS) 10 MG/ML IV SOLN
INTRAVENOUS | Status: DC | PRN
  Administered 2021-12-21 (×3): 80 ug via INTRAVENOUS
  Administered 2021-12-21: 160 ug via INTRAVENOUS

## 2021-12-21 MED ORDER — MENTHOL 3 MG MT LOZG: 1.0000 | LOZENGE | OROMUCOSAL | Status: DC | PRN

## 2021-12-21 MED ORDER — METOCLOPRAMIDE HCL 5 MG PO TABS: 5.0000 mg | ORAL_TABLET | Freq: Three times a day (TID) | ORAL | Status: DC | PRN

## 2021-12-21 MED ORDER — PHENYLEPHRINE HCL-NACL 20-0.9 MG/250ML-% IV SOLN
INTRAVENOUS | Status: DC | PRN
  Administered 2021-12-21: 30 ug/min via INTRAVENOUS

## 2021-12-21 MED ORDER — METHOCARBAMOL 500 MG PO TABS
500.0000 mg | ORAL_TABLET | Freq: Four times a day (QID) | ORAL | Status: DC | PRN
  Administered 2021-12-21: 500 mg via ORAL
  Filled 2021-12-21: qty 1

## 2021-12-21 MED ORDER — 0.9 % SODIUM CHLORIDE (POUR BTL) OPTIME
TOPICAL | Status: DC | PRN
  Administered 2021-12-21: 200 mL

## 2021-12-21 MED ORDER — SENNOSIDES-DOCUSATE SODIUM 8.6-50 MG PO TABS
2.0000 | ORAL_TABLET | Freq: Two times a day (BID) | ORAL | Status: DC
  Administered 2021-12-21 – 2021-12-23 (×5): 2 via ORAL
  Filled 2021-12-21 (×5): qty 2

## 2021-12-21 MED ORDER — DIPHENHYDRAMINE HCL 12.5 MG/5ML PO ELIX: 12.5000 mg | ORAL_SOLUTION | ORAL | Status: DC | PRN

## 2021-12-21 MED ORDER — BUPIVACAINE HCL (PF) 0.5 % IJ SOLN
INTRAMUSCULAR | Status: DC | PRN
  Administered 2021-12-21: 2.5 mL via INTRATHECAL

## 2021-12-21 MED ORDER — PHENOL 1.4 % MT LIQD: 1.0000 | OROMUCOSAL | Status: DC | PRN

## 2021-12-21 MED ORDER — METOCLOPRAMIDE HCL 5 MG/ML IJ SOLN: 5.0000 mg | Freq: Three times a day (TID) | INTRAMUSCULAR | Status: DC | PRN

## 2021-12-21 MED ORDER — EPHEDRINE SULFATE (PRESSORS) 50 MG/ML IJ SOLN
INTRAMUSCULAR | Status: DC | PRN
  Administered 2021-12-21: 10 mg via INTRAVENOUS
  Administered 2021-12-21: 5 mg via INTRAVENOUS

## 2021-12-21 MED ORDER — PROPOFOL 500 MG/50ML IV EMUL
INTRAVENOUS | Status: DC | PRN
  Administered 2021-12-21: 30 ug/kg/min via INTRAVENOUS

## 2021-12-21 MED ORDER — DEXMEDETOMIDINE HCL IN NACL 200 MCG/50ML IV SOLN
INTRAVENOUS | Status: DC | PRN
  Administered 2021-12-21: 8 ug via INTRAVENOUS

## 2021-12-21 MED ORDER — ONDANSETRON HCL 4 MG PO TABS: 4.0000 mg | ORAL_TABLET | Freq: Four times a day (QID) | ORAL | Status: DC | PRN

## 2021-12-21 MED ORDER — PHENYLEPHRINE 80 MCG/ML (10ML) SYRINGE FOR IV PUSH (FOR BLOOD PRESSURE SUPPORT)
PREFILLED_SYRINGE | INTRAVENOUS | Status: AC
  Filled 2021-12-21: qty 10

## 2021-12-21 MED ORDER — ALUM & MAG HYDROXIDE-SIMETH 200-200-20 MG/5ML PO SUSP
30.0000 mL | ORAL | Status: DC | PRN
Start: 2021-12-21 — End: 2021-12-24

## 2021-12-21 MED ORDER — MAGNESIUM HYDROXIDE 400 MG/5ML PO SUSP
30.0000 mL | Freq: Every day | ORAL | Status: DC
  Administered 2021-12-22 – 2021-12-23 (×2): 30 mL via ORAL
  Filled 2021-12-21 (×3): qty 30

## 2021-12-21 MED ORDER — EPHEDRINE 5 MG/ML INJ
INTRAVENOUS | Status: AC
  Filled 2021-12-21: qty 5

## 2021-12-21 MED ORDER — METHOCARBAMOL 1000 MG/10ML IJ SOLN: 500.0000 mg | Freq: Four times a day (QID) | INTRAVENOUS | Status: DC | PRN

## 2021-12-21 MED ORDER — LACTATED RINGERS IV SOLN: INTRAVENOUS | Status: DC | PRN

## 2021-12-21 MED ORDER — ENSURE ENLIVE PO LIQD
237.0000 mL | Freq: Two times a day (BID) | ORAL | Status: DC
  Administered 2021-12-21 – 2021-12-24 (×6): 237 mL via ORAL

## 2021-12-21 MED ORDER — HALOPERIDOL LACTATE 5 MG/ML IJ SOLN
2.5000 mg | Freq: Once | INTRAMUSCULAR | Status: AC
  Administered 2021-12-21: 2.5 mg via INTRAVENOUS
  Filled 2021-12-21: qty 1

## 2021-12-21 MED ORDER — SODIUM CHLORIDE (PF) 0.9 % IJ SOLN
INTRAMUSCULAR | Status: AC
  Filled 2021-12-21: qty 10

## 2021-12-21 MED ORDER — CEFAZOLIN SODIUM-DEXTROSE 1-4 GM/50ML-% IV SOLN
1.0000 g | Freq: Four times a day (QID) | INTRAVENOUS | Status: AC
  Administered 2021-12-21 (×2): 1 g via INTRAVENOUS
  Filled 2021-12-21 (×3): qty 50

## 2021-12-21 MED ORDER — ENOXAPARIN SODIUM 30 MG/0.3ML IJ SOSY
30.0000 mg | PREFILLED_SYRINGE | INTRAMUSCULAR | Status: DC
  Administered 2021-12-22 – 2021-12-24 (×3): 30 mg via SUBCUTANEOUS
  Filled 2021-12-21 (×3): qty 0.3

## 2021-12-21 MED ORDER — FENTANYL CITRATE (PF) 100 MCG/2ML IJ SOLN: 25.0000 ug | INTRAMUSCULAR | Status: DC | PRN

## 2021-12-21 MED ORDER — PROPOFOL 10 MG/ML IV BOLUS
INTRAVENOUS | Status: DC | PRN
  Administered 2021-12-21: 30 mg via INTRAVENOUS

## 2021-12-21 MED ORDER — ASPIRIN 81 MG PO TBEC
81.0000 mg | DELAYED_RELEASE_TABLET | Freq: Every day | ORAL | Status: DC
  Administered 2021-12-22 – 2021-12-24 (×3): 81 mg via ORAL
  Filled 2021-12-21 (×3): qty 1

## 2021-12-21 MED ORDER — ONDANSETRON HCL 4 MG/2ML IJ SOLN: 4.0000 mg | Freq: Four times a day (QID) | INTRAMUSCULAR | Status: DC | PRN

## 2021-12-21 MED ORDER — VITAMIN D 25 MCG (1000 UNIT) PO TABS
2000.0000 [IU] | ORAL_TABLET | Freq: Every day | ORAL | Status: DC
  Administered 2021-12-22 – 2021-12-24 (×3): 2000 [IU] via ORAL
  Filled 2021-12-21 (×3): qty 2

## 2021-12-21 MED ORDER — BISACODYL 10 MG RE SUPP: 10.0000 mg | Freq: Every day | RECTAL | Status: DC | PRN

## 2021-12-21 MED ORDER — CEFAZOLIN SODIUM 1 G IJ SOLR
INTRAMUSCULAR | Status: AC
  Filled 2021-12-21: qty 10

## 2021-12-21 MED ORDER — HALOPERIDOL LACTATE 5 MG/ML IJ SOLN: 2.0000 mg | Freq: Two times a day (BID) | INTRAMUSCULAR | Status: DC | PRN

## 2021-12-21 MED ORDER — SODIUM CHLORIDE 0.9 % IV SOLN
INTRAVENOUS | Status: DC
Start: 2021-12-21 — End: 2021-12-24

## 2021-12-21 MED ORDER — POLYETHYLENE GLYCOL 3350 17 G PO PACK
17.0000 g | PACK | Freq: Two times a day (BID) | ORAL | Status: DC
  Administered 2021-12-21 – 2021-12-23 (×5): 17 g via ORAL
  Filled 2021-12-21 (×5): qty 1

## 2021-12-21 MED ORDER — SODIUM CHLORIDE (PF) 0.9 % IJ SOLN
INTRAMUSCULAR | Status: DC | PRN
  Administered 2021-12-21: 90 mL

## 2021-12-21 SURGICAL SUPPLY — 53 items
BLADE SAW SAG 25X90X1.19 (BLADE) ×1 IMPLANT
BNDG COHESIVE 6X5 TAN ST LF (GAUZE/BANDAGES/DRESSINGS) ×6 IMPLANT
CEMENT BONE 40GM (Cement) ×2 IMPLANT
CHLORAPREP W/TINT 26 (MISCELLANEOUS) ×2 IMPLANT
COVER BACK TABLE REUSABLE LG (DRAPES) ×2 IMPLANT
DRAPE 3/4 80X56 (DRAPES) ×6 IMPLANT
DRAPE C-ARM XRAY 36X54 (DRAPES) ×2 IMPLANT
DRAPE INCISE IOBAN 66X60 STRL (DRAPES) IMPLANT
DRAPE POUCH INSTRU U-SHP 10X18 (DRAPES) ×2 IMPLANT
DRESSING SURGICEL FIBRLLR 1X2 (HEMOSTASIS) ×2 IMPLANT
DRSG MEPILEX SACRM 8.7X9.8 (GAUZE/BANDAGES/DRESSINGS) ×2 IMPLANT
DRSG SURGICEL FIBRILLAR 1X2 (HEMOSTASIS) ×4
ELECT BLADE 6.5 EXT (BLADE) ×2 IMPLANT
ELECT REM PT RETURN 9FT ADLT (ELECTROSURGICAL) ×2
ELECTRODE REM PT RTRN 9FT ADLT (ELECTROSURGICAL) ×1 IMPLANT
GAUZE SPONGE 4X4 12PLY STRL (GAUZE/BANDAGES/DRESSINGS) ×1 IMPLANT
GAUZE XEROFORM 1X8 LF (GAUZE/BANDAGES/DRESSINGS) ×1 IMPLANT
GLOVE BIOGEL PI IND STRL 9 (GLOVE) ×1 IMPLANT
GLOVE BIOGEL PI INDICATOR 9 (GLOVE) ×1
GLOVE SURG SYN 9.0  PF PI (GLOVE) ×2
GLOVE SURG SYN 9.0 PF PI (GLOVE) ×2 IMPLANT
GOWN SRG 2XL LVL 4 RGLN SLV (GOWNS) ×1 IMPLANT
GOWN STRL NON-REIN 2XL LVL4 (GOWNS) ×1
GOWN STRL REUS W/ TWL LRG LVL3 (GOWN DISPOSABLE) ×1 IMPLANT
GOWN STRL REUS W/TWL LRG LVL3 (GOWN DISPOSABLE) ×1
HEAD BIPOLARE SZ46 HEAD 28 (Head) ×1 IMPLANT
HIP FEM HD S 28 (Head) ×1 IMPLANT
HOLDER FOLEY CATH W/STRAP (MISCELLANEOUS) ×2 IMPLANT
HOOD PEEL AWAY FLYTE STAYCOOL (MISCELLANEOUS) ×2 IMPLANT
KNIFE SCULPS 14X20 (INSTRUMENTS) ×1 IMPLANT
MANIFOLD NEPTUNE II (INSTRUMENTS) ×2 IMPLANT
MAT ABSORB  FLUID 56X50 GRAY (MISCELLANEOUS) ×1
MAT ABSORB FLUID 56X50 GRAY (MISCELLANEOUS) ×1 IMPLANT
NDL SPNL 20GX3.5 QUINCKE YW (NEEDLE) ×2 IMPLANT
NEEDLE SPNL 20GX3.5 QUINCKE YW (NEEDLE) ×4 IMPLANT
NS IRRIG 1000ML POUR BTL (IV SOLUTION) ×2 IMPLANT
PACK HIP COMPR (MISCELLANEOUS) ×2 IMPLANT
PAD ABD DERMACEA PRESS 5X9 (GAUZE/BANDAGES/DRESSINGS) ×1 IMPLANT
SCALPEL PROTECTED #10 DISP (BLADE) ×3 IMPLANT
STAPLER SKIN PROX 35W (STAPLE) ×2 IMPLANT
STEM FEMORAL STD SZ0 (Stem) ×1 IMPLANT
STRAP SAFETY 5IN WIDE (MISCELLANEOUS) ×2 IMPLANT
SUT DVC 2 QUILL PDO  T11 36X36 (SUTURE) ×1
SUT DVC 2 QUILL PDO T11 36X36 (SUTURE) ×1 IMPLANT
SUT SILK 0 (SUTURE) ×1
SUT SILK 0 30XBRD TIE 6 (SUTURE) ×1 IMPLANT
SUT V-LOC 90 ABS DVC 3-0 CL (SUTURE) ×2 IMPLANT
SUT VIC AB 1 CT1 36 (SUTURE) ×2 IMPLANT
SYR 50ML LL SCALE MARK (SYRINGE) ×4 IMPLANT
SYR BULB IRRIG 60ML STRL (SYRINGE) ×2 IMPLANT
TOWEL OR 17X26 4PK STRL BLUE (TOWEL DISPOSABLE) IMPLANT
TRAY FOLEY MTR SLVR 16FR STAT (SET/KITS/TRAYS/PACK) ×2 IMPLANT
WATER STERILE IRR 1000ML POUR (IV SOLUTION) ×2 IMPLANT

## 2021-12-21 NOTE — Anesthesia Preprocedure Evaluation (Signed)
Anesthesia Evaluation  Patient identified by MRN, date of birth, ID band Patient awake    Reviewed: Allergy & Precautions, NPO status , Patient's Chart, lab work & pertinent test results  History of Anesthesia Complications Negative for: history of anesthetic complications  Airway Mallampati: II       Dental  (+) Dental Advidsory Given, Poor Dentition   Pulmonary neg pulmonary ROS, neg sleep apnea, neg COPD, Not current smoker,           Cardiovascular (-) hypertension(-) Past MI and (-) CHF negative cardio ROS  (-) dysrhythmias (-) Valvular Problems/Murmurs     Neuro/Psych neg Seizures PSYCHIATRIC DISORDERS Dementia    GI/Hepatic negative GI ROS, Neg liver ROS, neg GERD  ,  Endo/Other  neg diabetes  Renal/GU negative Renal ROS     Musculoskeletal   Abdominal   Peds  Hematology   Anesthesia Other Findings Past Medical History: No date: Diverticulosis No date: History of Clostridium difficile infection     Comment:  uncertain dates, possibly in 2017 or 2018   Reproductive/Obstetrics                             Anesthesia Physical  Anesthesia Plan  ASA: 2  Anesthesia Plan: Spinal   Post-op Pain Management:    Induction:   PONV Risk Score and Plan: Propofol infusion and TIVA  Airway Management Planned: Nasal Cannula and Natural Airway  Additional Equipment:   Intra-op Plan:   Post-operative Plan:   Informed Consent: I have reviewed the patients History and Physical, chart, labs and discussed the procedure including the risks, benefits and alternatives for the proposed anesthesia with the patient or authorized representative who has indicated his/her understanding and acceptance.       Plan Discussed with:   Anesthesia Plan Comments:         Anesthesia Quick Evaluation

## 2021-12-21 NOTE — Assessment & Plan Note (Signed)
Patient becoming delirious with sundowning which is very common in this age group with advanced dementia. -Continue risperidone -Delirium precautions

## 2021-12-21 NOTE — Assessment & Plan Note (Signed)
Secondary to mechanical fall. Patient was taken to the OR for hemiarthroplasty-tolerated the procedure well. -Continue with pain management -PT OT evaluation

## 2021-12-21 NOTE — Transfer of Care (Signed)
Immediate Anesthesia Transfer of Care Note  Patient: Faith Vasquez  Procedure(s) Performed: ANTERIOR APPROACH HEMI HIP ARTHROPLASTY (Right)  Patient Location: PACU  Anesthesia Type:spinal Level of Consciousness: drowsy and patient cooperative  Airway & Oxygen Therapy: Patient Spontanous Breathing and Patient connected to nasal cannula oxygen  Post-op Assessment: Report given to RN and Post -op Vital signs reviewed and stable  Post vital signs: Reviewed and stable  Last Vitals:  Vitals Value Taken Time  BP 92/62 12/21/21 1239  Temp    Pulse 69 12/21/21 1240  Resp 21 12/21/21 1240  SpO2 98 % 12/21/21 1240  Vitals shown include unvalidated device data.  Last Pain:  Vitals:   12/21/21 0831  TempSrc:   PainSc: 0-No pain      Patients Stated Pain Goal: 0 (12/20/21 2300)  Complications: No notable events documented.

## 2021-12-21 NOTE — Progress Notes (Signed)
Progress Note   Patient: Faith Vasquez ZHG:[id number] DOB: 17-Oct-1922 DOA: 12/20/2021     1 DOS: the patient was seen and examined on 12/21/2021   Brief hospital course: [name] is a 88 year old with memory impairment, Alzheimer's, hypertension, who presents to the ED for chief concern of fall and possible right hip failure.   Initial temperature is pending, respiration rate of 22, heart rate of 77, blood pressure 155/82, spO2 of 90% on 2 L [place].   Serum sodium is 140, potassium 2.6, chloride 107, bicarb 25, BUN of 28, serum creatinine 1.17, nonfasting blood glucose 107, WBC 8.8, hemoglobin 14.1, platelets of 142.  GFR 43.  UA was positive for large leukocytes and nitrates on 12/19/2021.  ED treatment: Cefazolin 1 g IV  7/30: She was also found to have right femoral neck fracture secondary to mechanical fall. She was seen in the ED on 12/20/21, for a fall and was able to ambulate in the ED and thus discharged from the ED. Per report, she fell again on 12/20/21 and was seen at [facility] and was found to have right hip fracture. She was sent to [facility] ED for chief concern of acute hypoxic respiratory at 87% on room air.  Orthopedic was consulted and she had her hemiarthroplasty this morning. Preliminary urine cultures positive for Klebsiella pneumoniae with good sensitivity, only resistant to ampicillin, switch antibiotics to cefazolin.  Patient is high risk for deterioration and death based on age, comorbidities, recent hip fracture.  She is currently full code.  Palliative care consult.   Assessment and Plan: * Acute hypoxemic respiratory failure (HCC) - Etiology work-up in progress, query secondary to pain.  Procalcitonin negative -Continue to monitor -Continue with supplemental oxygen as patient just had her surgery, wean as tolerated  Closed left hip fracture (HCC) Secondary to mechanical fall. Patient was taken to the OR for hemiarthroplasty-tolerated the procedure  well. -Continue with pain management -PT OT evaluation  Essential hypertension Blood pressure within normal limit, at times becoming soft. -Keep holding home amlodipine - Hydralazine 5 mg IV every 6 hours as needed for SBP greater than 180, 4 days ordered  Right hip pain See above  Advanced dementia [facility]) Patient becoming delirious with sundowning which is very common in this age group with advanced dementia. -Continue risperidone -Delirium precautions  UTI (urinary tract infection) - Present on admission Urine culture grew Klebsiella pneumonia which shows resistance only to ampicillin.  Patient was on ceftriaxone. -Switch ceftriaxone with cefazolin-can placed on Keflex from tomorrow   Subjective: Patient was seen after the surgery, tolerated the procedure well.  Pain seems well controlled.  Daughter-in-law at bedside.  Physical Exam: Vitals:   12/21/21 1345 12/21/21 1400 12/21/21 1415 12/21/21 1446  BP: (!) 105/58 (!) 110/55 105/83 128/70  Pulse: 69 75 79 83  Resp: 18 20 17    Temp:   (!) 97.3 F (36.3 C) 97.7 F (36.5 C)  TempSrc:      SpO2: 94% 99% 97% 96%  Weight:      Height:       General.  Hard of hearing elderly lady, in no acute distress. Pulmonary.  Lungs clear bilaterally, normal respiratory effort. CV.  Regular rate and rhythm, no JVD, rub or murmur. Abdomen.  Soft, nontender, nondistended, BS positive. CNS.  Alert and oriented .  No focal neurologic deficit. Extremities.  No edema, no cyanosis, pulses intact and symmetrical. Psychiatry.  Appears to have some cognitive impairment  Data Reviewed: Prior notes,  labs and images reviewed  Family Communication: Discussed with daughter-in-law at bedside, discussed about CODE STATUS, she will discuss with her husband who is her POA and let us know.  Disposition: Status is: Inpatient Remains inpatient appropriate because: Severity of illness   Planned Discharge Destination:  To be determined  DVT  prophylaxis.  Lovenox Time spent: 45 minutes  This record has been created using Conservation officer, historic buildings. Errors have been sought and corrected,but may not always be located. Such creation errors do not reflect on the standard of care.  Author: Arnetha Courser, MD 12/21/2021 3:17 PM  For on call review [email or web address].

## 2021-12-21 NOTE — Plan of Care (Signed)

## 2021-12-21 NOTE — Plan of Care (Signed)

## 2021-12-21 NOTE — Anesthesia Procedure Notes (Signed)
Spinal  Patient location during procedure: OR Start time: 12/21/2021 10:58 AM End time: 12/21/2021 11:08 AM Reason for block: surgical anesthesia Staffing Performed: anesthesiologist and resident/CRNA  Anesthesiologist: Martha Clan, MD Resident/CRNA: Jonna Clark, CRNA Performed by: Jonna Clark, CRNA Authorized by: Martha Clan, MD   Preanesthetic Checklist Completed: patient identified, IV checked, site marked, risks and benefits discussed, surgical consent, monitors and equipment checked, pre-op evaluation and timeout performed Spinal Block Patient position: sitting Prep: ChloraPrep Patient monitoring: heart rate, continuous pulse ox, blood pressure and cardiac monitor Approach: midline Location: L3-4 Injection technique: single-shot Needle Needle type: Whitacre and Introducer  Needle gauge: 24 G Needle length: 9 cm Assessment Sensory level: T10 Events: CSF return Additional Notes Sterile aseptic technique used throughout the procedure.  Negative paresthesia. Negative blood return. Positive free-flowing CSF. Expiration date of kit checked and confirmed. Patient tolerated procedure well, without complications.

## 2021-12-21 NOTE — Progress Notes (Signed)
Pt is being combative to her daughter-in-law at the bedside, trying to get up and walk out of bed. She's confused now and wants to get up. Kicking her guardian and chairs that we placed at the small opening in her bedside. Xanax which was given earlier was not working and let hospitalist know if we can give her something more stronger.

## 2021-12-21 NOTE — Progress Notes (Signed)
[employer] - pateint become serious delirious and combative with family and staff. Unable to redirect  Action - 2.5 IVHaldol  Response - resting well

## 2021-12-21 NOTE — Progress Notes (Signed)
Pt is sundowning and guardian at the bedside requests for anti-anxiety meds. Anxious and sometimes shouts at her guardian but not violent. Guardian at the bedside showed her home meds and requests for them to be given, been taking risperidone 0.25mg  in the morning and evening and alprazolam 0.25mg  as needed in [facility], Hospitalist made aware.  Pt is also on Full code in Epic, guadian said she is DNI, guardian has a hard copy. photocopied and place in the hard chart and let hospitalist know.

## 2021-12-21 NOTE — Op Note (Signed)
12/21/2021  12:33 PM  PATIENT:  Faith Vasquez  88 y.o. female  PRE-OPERATIVE DIAGNOSIS:  Femoral head fracture  POST-OPERATIVE DIAGNOSIS:  Status post hip hemiarthroplasty  PROCEDURE:  Procedure(s): ANTERIOR APPROACH HEMI HIP ARTHROPLASTY (Right)  SURGEON: Leitha Schuller, MD  ASSISTANTS: none  ANESTHESIA:   spinal  EBL:  Total I/O In: 50 [IV Piggyback:50] Out: 50 [Blood:50]  BLOOD ADMINISTERED:none  DRAINS: none   LOCAL MEDICATIONS USED:  MARCAINE    and OTHER Exparel  SPECIMEN:  Source of Specimen:    Femoral head and neck  DISPOSITION OF SPECIMEN:  PATHOLOGY  COUNTS:  YES  TOURNIQUET:  * No tourniquets in log *  IMPLANTS: Medacta cemented AMIS 1 standard stem with 46 mm bipolar head and 28 mm small head  DICTATION: .Dragon Dictation   The patient was brought to the operating room and after spinal anesthesia was obtained patient was placed on the operative table with the ipsilateral foot into the Medacta attachment, contralateral leg on a well-padded table. C-arm was brought in and preop template x-ray taken. After prepping and draping in usual sterile fashion appropriate patient identification and timeout procedures were completed. Anterior approach to the hip was obtained and centered over the greater trochanter and TFL muscle. The subcutaneous tissue was incised hemostasis being achieved by electrocautery. TFL fascia was incised and the muscle retracted laterally deep retractor placed. The lateral femoral circumflex vessels were identified and ligated. The anterior capsule was exposed and a capsulotomy performed. The neck was identified and a femoral neck cut carried out with a saw below the level of the fracture. The head was removed without difficulty and showed mild degenerative changes it sized to a 46 and a 46 trial fit well. . The leg was then externally rotated and ischiofemoral and pubofemoral releases carried out. The femur was sequentially broached to a size 1,  size with x-ray showing good position the 0 cemented stem was chosen.  Cement was mixed and after cement plug was placed on the canal cement was pressurized. The 0 standard stem was inserted along with a metal S 28 mm head and 46 mm l bipolar head. The hip was reduced and was stable the wound was thoroughly irrigated with fibrillar placed along the posterior capsule and medial neck. The deep fascia ws closed using a heavy Quill after infiltration of 30 cc of quarter percent Sensorcaine with epinephrine diluted with Exparel throughout the case .3-0 V-loc to close the skin with skin staples.  Incisional wound VAC applied and patient was sent to recovery in stable condition.   PLAN OF CARE: Admit to inpatient   PATIENT DISPOSITION:  PACU - hemodynamically stable.

## 2021-12-22 DIAGNOSIS — J9601 Acute respiratory failure with hypoxia: Secondary | ICD-10-CM | POA: Diagnosis not present

## 2021-12-22 LAB — BASIC METABOLIC PANEL
Anion gap: 7 (ref 5–15)
BUN: 24 mg/dL — ABNORMAL HIGH (ref 8–23)
CO2: 21 mmol/L — ABNORMAL LOW (ref 22–32)
Calcium: 8.9 mg/dL (ref 8.9–10.3)
Chloride: 108 mmol/L (ref 98–111)
Creatinine, Ser: 0.96 mg/dL (ref 0.44–1.00)
GFR, Estimated: 54 mL/min — ABNORMAL LOW (ref >60)
Glucose, Bld: 106 mg/dL — ABNORMAL HIGH (ref 70–99)
Potassium: 3.7 mmol/L (ref 3.5–5.1)
Sodium: 136 mmol/L (ref 135–145)

## 2021-12-22 LAB — CBC
HCT: 36.8 % (ref 36.0–46.0)
Hemoglobin: 11.8 g/dL — ABNORMAL LOW (ref 12.0–15.0)
MCH: 27.6 pg (ref 26.0–34.0)
MCHC: 32.1 g/dL (ref 30.0–36.0)
MCV: 86.2 fL (ref 80.0–100.0)
Platelets: 120 10*3/uL — ABNORMAL LOW (ref 150–400)
RBC: 4.27 MIL/uL (ref 3.87–5.11)
RDW: 14 % (ref 11.5–15.5)
WBC: 8 10*3/uL (ref 4.0–10.5)
nRBC: 0 % (ref 0.0–0.2)

## 2021-12-22 MED ORDER — CEFAZOLIN SODIUM-DEXTROSE 1-4 GM/50ML-% IV SOLN
1.0000 g | Freq: Three times a day (TID) | INTRAVENOUS | Status: DC
  Administered 2021-12-22 – 2021-12-24 (×6): 1 g via INTRAVENOUS
  Filled 2021-12-22 (×7): qty 50

## 2021-12-22 MED ORDER — HYDROCODONE-ACETAMINOPHEN 5-325 MG PO TABS
1.0000 | ORAL_TABLET | ORAL | Status: DC | PRN
  Administered 2021-12-22 – 2021-12-24 (×6): 1 via ORAL
  Filled 2021-12-22 (×7): qty 1

## 2021-12-22 NOTE — Progress Notes (Signed)
Progress Note   Patient: Faith Vasquez JOA:[id number] DOB: [birth year] DOA: 12/20/2021     2 DOS: the patient was seen and examined on 12/22/2021   Brief hospital course: [name] is a 88 year old with memory impairment, Alzheimer's, hypertension, who presents to the ED for chief concern of fall and possible right hip failure.   Initial temperature is pending, respiration rate of 22, heart rate of 77, blood pressure 155/82, spO2 of 90% on 2 L [place].   Serum sodium is 140, potassium 2.6, chloride 107, bicarb 25, BUN of 28, serum creatinine 1.17, nonfasting blood glucose 107, WBC 8.8, hemoglobin 14.1, platelets of 142.  GFR 43.  UA was positive for large leukocytes and nitrates on 12/19/2021.  ED treatment: Cefazolin 1 g IV  7/29: She was also found to have right femoral neck fracture secondary to mechanical fall. She was seen in the ED on 12/20/21, for a fall and was able to ambulate in the ED and thus discharged from the ED. Per report, she fell again on 12/20/21 and was seen at [facility] and was found to have right hip fracture. She was sent to [facility] ED for chief concern of acute hypoxic respiratory at 87% on room air.  Orthopedic was consulted and she had her hemiarthroplasty this morning. Preliminary urine cultures positive for Klebsiella pneumoniae with good sensitivity, only resistant to ampicillin, switch antibiotics to cefazolin.  7/30: Patient with overnight agitation, son refused Haldol.  Unable to sleep the whole night.  She was quite somnolent this morning.  PT is recommending SNF.  Patient is high risk for deterioration and death based on age, comorbidities, recent hip fracture.  She is currently full code.  Palliative care consult.   Assessment and Plan: * Acute hypoxemic respiratory failure (HCC) - Etiology work-up in progress, query secondary to pain.  Procalcitonin negative -Continue to monitor -Continue with supplemental oxygen  wean as tolerated  Closed  left hip fracture (HCC) Secondary to mechanical fall.  S/p hemiarthroplasty on 12/21/2021 -Continue with pain management -PT OT evaluation-recommending rehab -She will be weightbearing on right leg as tolerated   Essential hypertension Blood pressure within normal limit, at times becoming soft. -Keep holding home amlodipine - Hydralazine 5 mg IV every 6 hours as needed for SBP greater than 180, 4 days ordered  Right hip pain See above  Advanced dementia [facility]) Patient becoming delirious with sundowning which is very common in this age group with advanced dementia. -Continue risperidone -Delirium precautions  UTI (urinary tract infection) - Present on admission Urine culture grew Klebsiella pneumonia which shows resistance only to ampicillin.  Patient was on ceftriaxone. -Switch ceftriaxone with cefazolin-can placed on Keflex on discharge to complete a 5-day course   Subjective: Patient was resting comfortably when seen today.  Daughter-in-law at bedside and according to her she was up the whole night and would like to get some sleep.  Pain seems to be only during movement.  Physical Exam: Vitals:   12/21/21 1959 12/21/21 2001 12/22/21 0624 12/22/21 0936  BP: 132/74  (!) 128/110 124/65  Pulse: (!) 104 (!) 105 (!) 105 95  Resp: 17 18 18 19   Temp: 97.6 F (36.4 C) 97.6 F (36.4 C) 100 F (37.8 C) 97.8 F (36.6 C)  TempSrc:      SpO2: (!) 85% (!) 88% 90% 93%  Weight:      Height:       General.  Somnolent elderly lady, in no acute distress. Pulmonary.  Lungs  clear bilaterally, normal respiratory effort. CV.  Regular rate and rhythm, no JVD, rub or murmur. Abdomen.  Soft, nontender, nondistended, BS positive. CNS.  Somnolent, no apparent focal deficit Extremities.  No edema, no cyanosis, pulses intact and symmetrical. Psychiatry.  Judgment and insight appears impaired.  Data Reviewed: Prior data reviewed  Family Communication: Discussed with daughter-in-law at  bedside  Disposition: Status is: Inpatient Remains inpatient appropriate because: Severity of illness, need placement   Planned Discharge Destination: Skilled nursing facility  DVT prophylaxis.  Lovenox Time spent: 40 minutes  This record has been created using Conservation officer, historic buildings. Errors have been sought and corrected,but may not always be located. Such creation errors do not reflect on the standard of care.  Author: Arnetha Courser, MD 12/22/2021 2:06 PM  For on call review [email or web address].

## 2021-12-22 NOTE — Anesthesia Postprocedure Evaluation (Signed)
Anesthesia Post Note  Patient: Faith Vasquez  Procedure(s) Performed: ANTERIOR APPROACH HEMI HIP ARTHROPLASTY (Right)  Patient location during evaluation: PACU Anesthesia Type: Spinal Level of consciousness: confused Pain management: pain level controlled Vital Signs Assessment: post-procedure vital signs reviewed and stable Respiratory status: spontaneous breathing, respiratory function stable and patient connected to nasal cannula oxygen Cardiovascular status: blood pressure returned to baseline and stable Postop Assessment: no headache, no backache and no apparent nausea or vomiting Anesthetic complications: no   No notable events documented.   Last Vitals:  Vitals:   12/21/21 2001 12/22/21 0624  BP:  (!) 128/110  Pulse: (!) 105 (!) 105  Resp: 18 18  Temp: 36.4 C 37.8 C  SpO2: (!) 88% 90%    Last Pain:  Vitals:   12/22/21 0031  TempSrc:   PainSc: Asleep                 Corinda Gubler

## 2021-12-22 NOTE — Assessment & Plan Note (Signed)
Blood pressure within normal limit, at times becoming soft. -Keep holding home amlodipine - Hydralazine 5 mg IV every 6 hours as needed for SBP greater than 180, 4 days ordered 

## 2021-12-22 NOTE — TOC Initial Note (Signed)
Transition of Care [facility]) - Initial/Assessment Note    Patient Details  Name: Faith Vasquez MRN: [id number] Date of Birth: 15-Feb-1923  Transition of Care [facility]) CM/SW Contact:    Luvenia Redden, RN Phone Number:[phone] 12/22/2021, 2:29 PM  Clinical Narrative:                 Recommendation discussed with Cyndia Skeeters POA who requested all arrangements and recommendation be discussed with the local daughter-in-law [name]. RN reached out to [name] [phone] discussed the recommendations for pt to go to a rehab facility for further therapy. SNF process explained and caregiver receptive to start this process. [name] has requested information for choice as RN has left a copy MCR.GOV list of local facility at the bedside for her to review today when she is visiting.  Caregiver requested to review this information with the son of the pt's and she will follow up on tomorrow on her choices. Pt came from [facility] who does not provide the required therapies.   FL2 completed and TOC will follow up with Benay Spice Monday on facilitates of choice for bedsearch. No other needs to address at this time.  Expected Discharge Plan: Skilled Nursing Facility Barriers to Discharge: Continued Medical Work up   Patient Goals and CMS Choice     Choice offered to / list presented to :  (Daughter in law Benay Spice))  Expected Discharge Plan and Services Expected Discharge Plan: Skilled Nursing Facility   Discharge Planning Services: CM Consult   Living arrangements for the past 2 months: Group Home                                      Prior Living Arrangements/Services Living arrangements for the past 2 months: Group Home   Patient language and need for interpreter reviewed:: Yes Do you feel safe going back to the place where you live?: Yes      Need for Family Participation in Patient Care: Yes (Comment) Care giver support system in place?: Yes (comment)   Criminal  Activity/Legal Involvement Pertinent to Current Situation/Hospitalization: No - Comment as needed  Activities of Daily Living Home Assistive Devices/Equipment: Eyeglasses, Dan Humphreys (specify type) ADL Screening (condition at time of admission) Patient's cognitive ability adequate to safely complete daily activities?: No Is the patient deaf or have difficulty hearing?: Yes Does the patient have difficulty seeing, even when wearing glasses/contacts?: No Does the patient have difficulty concentrating, remembering, or making decisions?: Yes Patient able to express need for assistance with ADLs?: Yes Does the patient have difficulty dressing or bathing?: Yes Independently performs ADLs?: Yes (appropriate for developmental age) Does the patient have difficulty walking or climbing stairs?: Yes Weakness of Legs: None Weakness of Arms/Hands: None  Permission Sought/Granted   Permission granted to share information with : Yes, Verbal Permission Granted           Permission granted to share info w Contact Information: POA daughter Harriett Sine requested all arrangements be discussed with daughter-in-law Nakiyah Beverley  Emotional Assessment Appearance:: Appears stated age   Affect (typically observed): Accepting Orientation: : Oriented to Self Alcohol / Substance Use: Not Applicable    Admission diagnosis:  Closed fracture of right hip, initial encounter (HCC) [S72.001A] Acute hypoxemic respiratory failure (HCC) [J96.01] Patient Active Problem List   Diagnosis Date Noted   Acute hypoxemic respiratory failure (HCC) 12/20/2021   Essential hypertension 12/20/2021   Advanced dementia (  HCC) 12/20/2021   Right hip pain 12/20/2021   UTI (urinary tract infection) 12/20/2021   Acute sepsis [facility])    Acute respiratory failure (HCC) 09/19/2021   Multifocal pneumonia 09/19/2021   Hypokalemia 09/19/2021   Dehydration 09/19/2021   Closed left hip fracture (HCC) 03/24/2020   Left displaced femoral neck  fracture (HCC) 03/24/2020   GERD (gastroesophageal reflux disease) 03/24/2020   PCP:  Enid Baas, MD Pharmacy:   [facility] [phone] - [place], [place] - [address] AT [facility] [place] [address] [place] [address] Phone: [phone] Fax: [phone]  [id number] - [place], [place] - [address] [place] [address] Phone: [phone] Fax: [phone]     Social Determinants of Health (SDOH) Interventions    Readmission Risk Interventions     No data to display

## 2021-12-22 NOTE — Progress Notes (Signed)
  Subjective: 1 Day Post-Op Procedure(s) (LRB): ANTERIOR APPROACH HEMI HIP ARTHROPLASTY (Right) Patient pleasantly demented, daughter at bedside.   Objective: Vital signs in last 24 hours: Temp:  [96.9 F (36.1 C)-100 F (37.8 C)] 97.8 F (36.6 C) (07/30 0936) Pulse Rate:  [69-105] 95 (07/30 0936) Resp:  [16-23] 19 (07/30 0936) BP: (68-132)/(35-110) 124/65 (07/30 0936) SpO2:  [85 %-99 %] 93 % (07/30 0936)  Intake/Output from previous day:  Intake/Output Summary (Last 24 hours) at 12/22/2021 1013 Last data filed at 12/22/2021 0648 Gross per 24 hour  Intake 1776.52 ml  Output 1600 ml  Net 176.52 ml    Intake/Output this shift: No intake/output data recorded.  Labs: Recent Labs    12/20/21 1646 12/21/21 1533 12/22/21 0535  HGB 14.1 12.8 11.8*   Recent Labs    12/21/21 1533 12/22/21 0535  WBC 7.5 8.0  RBC 4.65 4.27  HCT 40.4 36.8  PLT 117* 120*   Recent Labs    12/21/21 0254 12/21/21 1533 12/22/21 0535  NA 138  --  136  K 3.7  --  3.7  CL 106  --  108  CO2 25  --  21*  BUN 25*  --  24*  CREATININE 1.13* 1.05* 0.96  GLUCOSE 121*  --  106*  CALCIUM 9.5  --  8.9   No results for input(s): "LABPT", "INR" in the last 72 hours.   EXAM General - Patient is  oriented to person only  Extremity - Neurovascular intact Dorsiflexion/Plantar flexion intact Compartment soft Dressing/Incision -clean, dry, Postoperative dressing remains in place. Motor Function - intact, moving foot and toes well on exam.  Assessment/Plan: 1 Day Post-Op Procedure(s) (LRB): ANTERIOR APPROACH HEMI HIP ARTHROPLASTY (Right) Principal Problem:   Acute hypoxemic respiratory failure (HCC) Active Problems:   Closed left hip fracture (HCC)   Essential hypertension   Advanced dementia (HCC)   Right hip pain   UTI (urinary tract infection)  Estimated body mass index is 23.11 kg/m as calculated from the following:   Height as of this encounter: 5\' 5"  (1.651 m).   Weight as of this  encounter: 63 kg. Advance diet Up with therapy       DVT Prophylaxis - Lovenox and SCDs Weight-Bearing as tolerated to right leg  , PA-C [facility] 12/22/2021, 10:13 AM

## 2021-12-22 NOTE — [place] FL2 (Signed)
[place] MEDICAID FL2 LEVEL OF CARE SCREENING TOOL     IDENTIFICATION  Patient Name: Faith Vasquez Birthdate: 1922/12/01 Sex: female Admission Date (Current Location): 12/20/2021  [facility] [place] Number:  Chiropodist and Address:  [facility], [address], [place], [place] [address]      Provider Number: [id number]  Attending Physician Name and Address:  Arnetha Courser, MD  Relative Name and Phone Number:  Aroura Vasudevan [phone]    Current Level of Care: SNF Recommended Level of Care: Skilled Nursing Facility Prior Approval Number:    Date Approved/Denied:   PASRR Number: [id number]  Discharge Plan: SNF    Current Diagnoses: Patient Active Problem List   Diagnosis Date Noted   Acute hypoxemic respiratory failure (HCC) 12/20/2021   Essential hypertension 12/20/2021   Advanced dementia (HCC) 12/20/2021   Right hip pain 12/20/2021   UTI (urinary tract infection) 12/20/2021   Acute sepsis (HCC)    Acute respiratory failure (HCC) 09/19/2021   Multifocal pneumonia 09/19/2021   Hypokalemia 09/19/2021   Dehydration 09/19/2021   Closed left hip fracture (HCC) 03/24/2020   Left displaced femoral neck fracture (HCC) 03/24/2020   GERD (gastroesophageal reflux disease) 03/24/2020    Orientation RESPIRATION BLADDER Height & Weight     Self  Normal External catheter Weight: 63 kg Height:  5\' 5"  (165.1 cm)  BEHAVIORAL SYMPTOMS/MOOD NEUROLOGICAL BOWEL NUTRITION STATUS      Continent Diet  AMBULATORY STATUS COMMUNICATION OF NEEDS Skin   Limited Assist   Normal                       Personal Care Assistance Level of Assistance  Bathing, Feeding, Dressing Bathing Assistance: Limited assistance Feeding assistance: Limited assistance Dressing Assistance: Limited assistance     Functional Limitations Info             SPECIAL CARE FACTORS FREQUENCY  PT (By licensed PT), OT (By licensed OT)     PT Frequency: 5 X  Weekly OT Frequency: 5 X Weekly            Contractures      Additional Factors Info                  Current Medications (12/22/2021):  This is the current hospital active medication list Current Facility-Administered Medications  Medication Dose Route Frequency Provider Last Rate Last Admin   0.9 %  sodium chloride infusion   Intravenous Continuous 12/24/2021, MD 100 mL/hr at 12/21/21 2356 New Bag at 12/21/21 2356   acetaminophen (TYLENOL) tablet 650 mg  650 mg Oral Q6H PRN 12/23/21, MD       Or   acetaminophen (TYLENOL) suppository 650 mg  650 mg Rectal Q6H PRN Kennedy Bucker, MD       ALPRAZolam Kennedy Bucker) tablet 0.25 mg  0.25 mg Oral BID PRN Prudy Feeler, MD   0.25 mg at 12/21/21 2011   alum & mag hydroxide-simeth (MAALOX/MYLANTA) 200-200-20 MG/5ML suspension 30 mL  30 mL Oral Q4H PRN 12-02-2000, MD       amLODipine (NORVASC) tablet 5 mg  5 mg Oral Daily Kennedy Bucker, MD   5 mg at 12/22/21 12/24/21   aspirin EC tablet 81 mg  81 mg Oral Daily 1540, MD   81 mg at 12/22/21 12/24/21   bisacodyl (DULCOLAX) suppository 10 mg  10 mg Rectal Daily PRN 0867, MD       ceFAZolin (ANCEF)  IVPB 1 g/50 mL premix  1 g Intravenous Q8H Nazari, Walid A, RPH 100 mL/hr at 12/22/21 1248 1 g at 12/22/21 1248   Chlorhexidine Gluconate Cloth 2 % PADS 6 each  6 each Topical Daily Kennedy Bucker, MD   6 each at 12/21/21 2238   cholecalciferol (VITAMIN D3) 25 MCG (1000 UNIT) tablet 2,000 Units  2,000 Units Oral Daily Kennedy Bucker, MD   2,000 Units at 12/22/21 0953   diphenhydrAMINE (BENADRYL) 12.5 MG/5ML elixir 12.5-25 mg  12.5-25 mg Oral Q4H PRN Kennedy Bucker, MD       docusate sodium (COLACE) capsule 100 mg  100 mg Oral BID Kennedy Bucker, MD   100 mg at 12/22/21 0952   enoxaparin (LOVENOX) injection 30 mg  30 mg Subcutaneous Q24H Kennedy Bucker, MD   30 mg at 12/22/21 4166   feeding supplement (ENSURE ENLIVE / ENSURE PLUS) liquid 237 mL  237 mL Oral BID BM Kennedy Bucker, MD   237 mL  at 12/22/21 1249   haloperidol lactate (HALDOL) injection 2 mg  2 mg Intravenous Q12H PRN Arnetha Courser, MD       hydrALAZINE (APRESOLINE) injection 5 mg  5 mg Intravenous Q6H PRN Kennedy Bucker, MD       HYDROcodone-acetaminophen (NORCO/VICODIN) 5-325 MG per tablet 1 tablet  1 tablet Oral Q4H PRN Kennedy Bucker, MD       magnesium hydroxide (MILK OF MAGNESIA) suspension 30 mL  30 mL Oral QHS Kennedy Bucker, MD       menthol-cetylpyridinium (CEPACOL) lozenge 3 mg  1 lozenge Oral PRN Kennedy Bucker, MD       Or   phenol (CHLORASEPTIC) mouth spray 1 spray  1 spray Mouth/Throat PRN Kennedy Bucker, MD       methocarbamol (ROBAXIN) tablet 500 mg  500 mg Oral Q6H PRN Kennedy Bucker, MD   500 mg at 12/21/21 1837   Or   methocarbamol (ROBAXIN) 500 mg in dextrose 5 % 50 mL IVPB  500 mg Intravenous Q6H PRN Kennedy Bucker, MD       metoCLOPramide (REGLAN) tablet 5-10 mg  5-10 mg Oral Q8H PRN Kennedy Bucker, MD       Or   metoCLOPramide (REGLAN) injection 5-10 mg  5-10 mg Intravenous Q8H PRN Kennedy Bucker, MD       morphine (PF) 2 MG/ML injection 1 mg  1 mg Intravenous Q4H PRN Kennedy Bucker, MD   1 mg at 12/22/21 0001   ondansetron (ZOFRAN) tablet 4 mg  4 mg Oral Q6H PRN Kennedy Bucker, MD       Or   ondansetron [facility]) injection 4 mg  4 mg Intravenous Q6H PRN Kennedy Bucker, MD       ondansetron [facility]) tablet 4 mg  4 mg Oral Q6H PRN Kennedy Bucker, MD       Or   ondansetron [facility]) injection 4 mg  4 mg Intravenous Q6H PRN Kennedy Bucker, MD       polyethylene glycol (MIRALAX / GLYCOLAX) packet 17 g  17 g Oral BID Kennedy Bucker, MD   17 g at 12/22/21 0630   risperiDONE (RISPERDAL) tablet 0.25 mg  0.25 mg Oral BID Kennedy Bucker, MD   0.25 mg at 12/22/21 1601   senna-docusate (Senokot-S) tablet 1 tablet  1 tablet Oral QHS PRN Kennedy Bucker, MD       senna-docusate (Senokot-S) tablet 2 tablet  2 tablet Oral BID Kennedy Bucker, MD   2 tablet at 12/22/21 0932   zolpidem (AMBIEN) tablet  5 mg  5 mg Oral QHS PRN Kennedy Bucker, MD   5 mg at 12/21/21 2349     Discharge Medications: Please see discharge summary for a list of discharge medications.  Relevant Imaging Results:  Relevant Lab Results:   Additional Information SS# [id number]  Luvenia Redden, RN

## 2021-12-22 NOTE — Assessment & Plan Note (Signed)
Secondary to mechanical fall.  S/p hemiarthroplasty on 12/21/2021 -Continue with pain management -PT OT evaluation-recommending rehab -She will be weightbearing on right leg as tolerated

## 2021-12-22 NOTE — Evaluation (Signed)
Physical Therapy Evaluation Patient Details Name: Faith Vasquez MRN: [id number] DOB: Oct 05, 1922 Today's Date: 12/22/2021  History of Present Illness  Patient is a 88 year old female who underwent anterior approach hemi arthroplasty 12/21/21 s/p fall with R hip failure. PMH includes memory impairment, Alzheimer's, HTN. Patient was seen in ED on 12/20/21 and was able to ambulate so was discharged. Per report patient fell again on 12/20/21 and was seen at [facility] clinic and found to have R hip fracture.   Clinical Impression  Patient is a 88 year old female s/p R hip hemi arthroplasty. Patient is very fatigued and constantly falling asleep limiting evaluation. Prior to hospital admission, pt was using a walker at her assisted living facility [facility].  Daughter in law present to assist with history as patient is hard of hearing and inconsistent with command follow. Patient has history of memory impairment and Alzheimers at baseline.  Very challenging for patient to remain awake during session requiring constant cueing to wake up. Patient's physician entered during session and checked patient's wound.  Patient is max A for transitions and sit to stand; however unsure if this is due to s/p surgery or patient cognition/alertness at this time. Patient's family agreeable to SNF plan as patient will require a higher level of care at this time. Pt would benefit from skilled PT to address noted impairments and functional limitations (see below for any additional details).  Upon hospital discharge, pt would benefit from SNF due to high fall risk.        Recommendations for follow up therapy are one component of a multi-disciplinary discharge planning process, led by the attending physician.  Recommendations may be updated based on patient status, additional functional criteria and insurance authorization.  Follow Up Recommendations Skilled nursing-short term rehab (<3 hours/day) Can patient physically be  transported by private vehicle: No (not yet at this time. May be able to by discharge)    Assistance Recommended at Discharge Frequent or constant Supervision/Assistance  Patient can return home with the following  A lot of help with walking and/or transfers;A lot of help with bathing/dressing/bathroom;Direct supervision/assist for medications management;Assistance with cooking/housework;Help with stairs or ramp for entrance    Equipment Recommendations BSC/3in1  Recommendations for Other Services       Functional Status Assessment Patient has had a recent decline in their functional status and demonstrates the ability to make significant improvements in function in a reasonable and predictable amount of time.     Precautions / Restrictions Precautions Precautions: Anterior Hip Precaution Booklet Issued: Yes (comment) Restrictions Weight Bearing Restrictions: Yes RLE Weight Bearing: Weight bearing as tolerated      Mobility  Bed Mobility Overal bed mobility: Needs Assistance Bed Mobility: Supine to Sit, Sit to Supine     Supine to sit: Max assist Sit to supine: Max assist   General bed mobility comments: Patient requires max A to sit EOB, patient constantly falling asleep requiring cueing to wake up.    Transfers Overall transfer level: Needs assistance Equipment used: Rolling walker (2 wheels) Transfers: Sit to/from Stand Sit to Stand: Max assist           General transfer comment: Max assistance for standing, patient crying out with pain. severe posterior lean    Ambulation/Gait               General Gait Details: unable to attempt this session due to patient cognition/alertness  Stairs  Wheelchair Mobility    Modified Rankin (Stroke Patients Only)       Balance Overall balance assessment: Needs assistance Sitting-balance support: Feet supported, Bilateral upper extremity supported Sitting balance-Leahy Scale: Poor Sitting  balance - Comments: patient falling asleep, posterior LOB Postural control: Posterior lean Standing balance support: Bilateral upper extremity supported, Reliant on assistive device for balance Standing balance-Leahy Scale: Poor Standing balance comment: requires max A to retain standing balance                             Pertinent Vitals/Pain Pain Assessment Pain Assessment: Faces Faces Pain Scale: Hurts whole lot Pain Location: R hip Pain Descriptors / Indicators: Aching, Guarding Pain Intervention(s): Limited activity within patient's tolerance    Home Living Family/patient expects to be discharged to:: Skilled nursing facility                   Additional Comments: Patient lives at [facility] but family is aware that patient will require SNF.    Prior Function Prior Level of Function : Needs assist  Cognitive Assist : Mobility (cognitive) Mobility (Cognitive): Set up cues   Physical Assist : Mobility (physical);ADLs (physical) Mobility (physical): Gait;Stairs ADLs (physical): Dressing;IADLs Mobility Comments: patient utilizes walker at baseline.       Hand Dominance   Dominant Hand: Right    Extremity/Trunk Assessment   Upper Extremity Assessment Upper Extremity Assessment: Defer to OT evaluation    Lower Extremity Assessment Lower Extremity Assessment: Generalized weakness;Difficult to assess due to impaired cognition (patient not alert enough/able to follow commands for muscle testing)       Communication   Communication: HOH  Cognition Arousal/Alertness: Lethargic Behavior During Therapy: Anxious Overall Cognitive Status: History of cognitive impairments - at baseline                                 General Comments: Patient has history of memory impairment and Alzheimer's. family present for history.        General Comments General comments (skin integrity, edema, etc.): bruising on forehead    Exercises Other  Exercises Other Exercises: Patient and patient's daughter in law educated on role of PT in acute care setting, safe mobility and transfers, need for safety.   Assessment/Plan    PT Assessment Patient needs continued PT services  PT Problem List Decreased strength;Decreased range of motion;Decreased activity tolerance;Decreased balance;Decreased knowledge of use of DME;Decreased cognition;Decreased coordination;Decreased mobility;Decreased safety awareness;Pain       PT Treatment Interventions DME instruction;Gait training;Stair training;Functional mobility training;Neuromuscular re-education;Balance training;Therapeutic exercise;Therapeutic activities;Patient/family education;Manual techniques    PT Goals (Current goals can be found in the Care Plan section)  Acute Rehab PT Goals PT Goal Formulation: Patient unable to participate in goal setting    Frequency BID     Co-evaluation               AM-PAC PT "6 Clicks" Mobility  Outcome Measure Help needed turning from your back to your side while in a flat bed without using bedrails?: A Lot Help needed moving from lying on your back to sitting on the side of a flat bed without using bedrails?: A Lot Help needed moving to and from a bed to a chair (including a wheelchair)?: A Lot Help needed standing up from a chair using your arms (e.g., wheelchair or bedside chair)?: A Lot Help needed  to walk in hospital room?: Total Help needed climbing 3-5 steps with a railing? : Total 6 Click Score: 10    End of Session Equipment Utilized During Treatment: Gait belt;Oxygen (4 L of oxygen via nasal cannula) Activity Tolerance: Patient limited by fatigue Patient left: in bed;with call bell/phone within reach;with bed alarm set;with family/visitor present Nurse Communication: Mobility status PT Visit Diagnosis: Unsteadiness on feet (R26.81);Other abnormalities of gait and mobility (R26.89);Muscle weakness (generalized) (M62.81);History of  falling (Z91.81);Difficulty in walking, not elsewhere classified (R26.2);Pain Pain - Right/Left: Right Pain - part of body: Hip    Time: 4782-9562 PT Time Calculation (min) (ACUTE ONLY): 32 min   Charges:   PT Evaluation $PT Eval Moderate Complexity: 1 Mod PT Treatments $Therapeutic Activity: 8-22 mins        Precious Bard, PT, DPT  12/22/2021, 12:38 PM

## 2021-12-22 NOTE — Progress Notes (Signed)
Pt is agitated and wants to get out of bed, son is at the bedside. Restless and confused. Alert only to herself. Son refused haldol. Gave Zolpidem as alternative since patient's guardian refused Haldol as he feels like it is what making her mother's mentation worse.

## 2021-12-22 NOTE — Assessment & Plan Note (Signed)
-   Etiology work-up in progress, query secondary to pain.  Procalcitonin negative -Continue to monitor -Continue with supplemental oxygen  wean as tolerated
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
## 2021-12-22 NOTE — Assessment & Plan Note (Signed)
-   Present on admission Urine culture grew Klebsiella pneumonia which shows resistance only to ampicillin.  Patient was on ceftriaxone. -Switch ceftriaxone with cefazolin-can placed on Keflex on discharge to complete a 5-day course

## 2021-12-23 ENCOUNTER — Encounter: Payer: Self-pay | Admitting: Orthopedic Surgery

## 2021-12-23 DIAGNOSIS — J9601 Acute respiratory failure with hypoxia: Secondary | ICD-10-CM | POA: Diagnosis not present

## 2021-12-23 LAB — CBC
HCT: 34.8 % — ABNORMAL LOW (ref 36.0–46.0)
Hemoglobin: 11.2 g/dL — ABNORMAL LOW (ref 12.0–15.0)
MCH: 27.9 pg (ref 26.0–34.0)
MCHC: 32.2 g/dL (ref 30.0–36.0)
MCV: 86.6 fL (ref 80.0–100.0)
Platelets: 107 10*3/uL — ABNORMAL LOW (ref 150–400)
RBC: 4.02 MIL/uL (ref 3.87–5.11)
RDW: 13.8 % (ref 11.5–15.5)
WBC: 7.2 10*3/uL (ref 4.0–10.5)
nRBC: 0 % (ref 0.0–0.2)

## 2021-12-23 NOTE — Progress Notes (Signed)
   Subjective: 2 Days Post-Op Procedure(s) (LRB): ANTERIOR APPROACH HEMI HIP ARTHROPLASTY (Right) Patient reports pain as mild.   Patient is well, and has had no acute complaints or problems Denies any CP, SOB, ABD pain. We will continue therapy today.  Plan is to go Skilled nursing facility after hospital stay.  Objective: Vital signs in last 24 hours: Temp:  [97.8 F (36.6 C)-99 F (37.2 C)] 97.9 F (36.6 C) (07/31 0751) Pulse Rate:  [83-95] 83 (07/31 0751) Resp:  [16-19] 16 (07/31 0751) BP: (109-147)/(64-86) 109/64 (07/31 0751) SpO2:  [93 %-97 %] 94 % (07/31 0751)  Intake/Output from previous day: 07/30 0701 - 07/31 0700 In: 290 [P.O.:240; IV Piggyback:50] Out: 980 [Urine:980] Intake/Output this shift: No intake/output data recorded.  Recent Labs    12/20/21 1646 12/21/21 1533 12/22/21 0535 12/23/21 0436  HGB 14.1 12.8 11.8* 11.2*   Recent Labs    12/22/21 0535 12/23/21 0436  WBC 8.0 7.2  RBC 4.27 4.02  HCT 36.8 34.8*  PLT 120* 107*   Recent Labs    12/21/21 0254 12/21/21 1533 12/22/21 0535  NA 138  --  136  K 3.7  --  3.7  CL 106  --  108  CO2 25  --  21*  BUN 25*  --  24*  CREATININE 1.13* 1.05* 0.96  GLUCOSE 121*  --  106*  CALCIUM 9.5  --  8.9   No results for input(s): "LABPT", "INR" in the last 72 hours.  EXAM General - Patient is Alert, Appropriate, and Oriented Extremity - Neurovascular intact Sensation intact distally Intact pulses distally Dorsiflexion/Plantar flexion intact Dressing - dressing C/D/I and no drainage Motor Function - intact, moving foot and toes well on exam.   Past Medical History:  Diagnosis Date   Diverticulosis    History of Clostridium difficile infection    uncertain dates, possibly in 2017 or 2018    Assessment/Plan:   2 Days Post-Op Procedure(s) (LRB): ANTERIOR APPROACH HEMI HIP ARTHROPLASTY (Right) Principal Problem:   Acute hypoxemic respiratory failure (HCC) Active Problems:   Closed left hip  fracture (HCC)   Essential hypertension   Advanced dementia (HCC)   Right hip pain   UTI (urinary tract infection)  Estimated body mass index is 23.11 kg/m as calculated from the following:   Height as of this encounter: 5\' 5"  (1.651 m).   Weight as of this encounter: 63 kg. Advance diet Up with therapy Pain controlled with norco VSS Labs stable CM to assist with discharge to SNF   Follow up with [facility] in 2 weeks Lovenox 40 mg SQ daily x 14 days at discharge  DVT Prophylaxis - Lovenox, TED hose, and SCDs Weight-Bearing as tolerated to right leg   T. , PA-C [facility] 12/23/2021, 8:17 AM

## 2021-12-23 NOTE — Progress Notes (Signed)
Progress Note   Patient: Faith Vasquez GGE:[id number] DOB: 06-May-1923 DOA: 12/20/2021     3 DOS: the patient was seen and examined on 12/23/2021   Brief hospital course: [name] is a 88 year old with memory impairment, Alzheimer's, hypertension, who presents to the ED for chief concern of fall and possible right hip failure.   Initial temperature is pending, respiration rate of 22, heart rate of 77, blood pressure 155/82, spO2 of 90% on 2 L [place].   Serum sodium is 140, potassium 2.6, chloride 107, bicarb 25, BUN of 28, serum creatinine 1.17, nonfasting blood glucose 107, WBC 8.8, hemoglobin 14.1, platelets of 142.  GFR 43.  UA was positive for large leukocytes and nitrates on 12/19/2021.  ED treatment: Cefazolin 1 g IV  7/29: She was also found to have right femoral neck fracture secondary to mechanical fall. She was seen in the ED on 12/20/21, for a fall and was able to ambulate in the ED and thus discharged from the ED. Per report, she fell again on 12/20/21 and was seen at [facility] and was found to have right hip fracture. She was sent to [facility] ED for chief concern of acute hypoxic respiratory at 87% on room air.  Orthopedic was consulted and she had her hemiarthroplasty this morning. Preliminary urine cultures positive for Klebsiella pneumoniae with good sensitivity, only resistant to ampicillin, switch antibiotics to cefazolin.  7/30: Patient with overnight agitation, son refused Haldol.  Unable to sleep the whole night.  She was quite somnolent this morning.  PT is recommending SNF.  7/31: Overnight experiencing urinary retention, bladder scan with more than 900 cc of urine, had one-time in and out catheter.  Labs stable.  Patient is high risk for deterioration and death based on age, comorbidities, recent hip fracture.  She is currently full code.  Palliative care consult.   Assessment and Plan: * Acute hypoxemic respiratory failure (HCC) - Etiology work-up in  progress, query secondary to pain.  Procalcitonin negative -Continue to monitor -Continue with supplemental oxygen  wean as tolerated  Closed left hip fracture (HCC) Secondary to mechanical fall.  S/p hemiarthroplasty on 12/21/2021 -Continue with pain management -PT OT evaluation-recommending rehab -She will be weightbearing on right leg as tolerated   Essential hypertension Blood pressure within normal limit, at times becoming soft. -Keep holding home amlodipine - Hydralazine 5 mg IV every 6 hours as needed for SBP greater than 180, 4 days ordered  Right hip pain See above  Advanced dementia [facility]) Patient becoming delirious with sundowning which is very common in this age group with advanced dementia. -Continue risperidone -Delirium precautions  UTI (urinary tract infection) - Present on admission Urine culture grew Klebsiella pneumonia which shows resistance only to ampicillin.  Patient was on ceftriaxone. -Switch ceftriaxone with cefazolin-can placed on Keflex on discharge to complete a 5-day course   Subjective: Patient was resting comfortably when seen today.  Son at bedside.  Per son she was more alert and participated with PT earlier today.  Had a better night with less agitation.  Physical Exam: Vitals:   12/22/21 1611 12/22/21 2009 12/23/21 0506 12/23/21 0751  BP: 137/86 (!) 147/82 118/65 109/64  Pulse: 94 94 88 83  Resp: 18 19 17 16   Temp: 98.6 F (37 C) 99 F (37.2 C) 98.7 F (37.1 C) 97.9 F (36.6 C)  TempSrc:  Oral Oral   SpO2: 97% 95% 97% 94%  Weight:      Height:  General.  Somnolent elderly lady, in no acute distress. Pulmonary.  Lungs clear bilaterally, normal respiratory effort. CV.  Regular rate and rhythm, no JVD, rub or murmur. Abdomen.  Soft, nontender, nondistended, BS positive. CNS.  Alert and oriented to self only.  No focal neurologic deficit. Extremities.  No edema, no cyanosis, pulses intact and symmetrical. Psychiatry.  Judgment  and insight appears impaired.  Data Reviewed: Prior data reviewed.  Family Communication: Discussed with son at bedside  Disposition: Status is: Inpatient Remains inpatient appropriate because: Will be going to [facility], need 1 more night to complete Medicare requirement.   Planned Discharge Destination: Skilled nursing facility  DVT prophylaxis.  Lovenox Time spent: 42 minutes  This record has been created using Conservation officer, historic buildings. Errors have been sought and corrected,but may not always be located. Such creation errors do not reflect on the standard of care.  Author: Arnetha Courser, MD 12/23/2021 4:13 PM  For on call review [email or web address].

## 2021-12-23 NOTE — Assessment & Plan Note (Signed)
-   Etiology work-up in progress, query secondary to pain.  Procalcitonin negative -Continue to monitor -Continue with supplemental oxygen  wean as tolerated 

## 2021-12-23 NOTE — Care Management Important Message (Signed)
Important Message  Patient Details  Name: Faith Vasquez MRN: [id number] Date of Birth: 29-Nov-1922   Medicare Important Message Given:  N/A - LOS <3 / Initial given by admissions     Olegario Messier A Pegeen Stiger 12/23/2021, 9:38 AM

## 2021-12-23 NOTE — Plan of Care (Signed)

## 2021-12-23 NOTE — TOC Progression Note (Signed)
Transition of Care [facility]) - Progression Note    Patient Details  Name: Faith Vasquez MRN: [id number] Date of Birth: 1923/01/26  Transition [facility]) CM/SW Contact  Marlowe Sax, RN Phone Number: 12/23/2021, 11:21 AM  Clinical Narrative:    Sherron Monday with Harvie Heck the patient's son and the patient and reviewed bed offers, they accepted [facility], Confirmed with Sue Lush, DC plan for tomorrow   Expected Discharge Plan: Skilled Nursing Facility Barriers to Discharge: Continued Medical Work up  Expected Discharge Plan and Services Expected Discharge Plan: Skilled Nursing Facility   Discharge Planning Services: CM Consult   Living arrangements for the past 2 months: Group Home                                       Social Determinants of Health (SDOH) Interventions    Readmission Risk Interventions     No data to display

## 2021-12-23 NOTE — Progress Notes (Signed)
       CROSS COVER NOTE  NAME: Faith Vasquez MRN: [id number] DOB : 1923/02/08    Date of Service   12/23/2021  HPI/Events of Note   Notified by nursing that M(r)s Fenoglio has not voided this shift and bladder scan showed 928 mL. Nursing staff reports she is too weak to utilize the bedside commode.  Interventions   Plan: In and Out Cath x1      This document was prepared using Dragon voice recognition software and may include unintentional dictation errors.  Bishop Limbo DNP, MHA, FNP-BC Nurse Practitioner [facility] Pager [phone]
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
## 2021-12-23 NOTE — Assessment & Plan Note (Signed)
Secondary to mechanical fall.  S/p hemiarthroplasty on 12/21/2021 -Continue with pain management -PT OT evaluation-recommending rehab -She will be weightbearing on right leg as tolerated  

## 2021-12-24 DIAGNOSIS — J9601 Acute respiratory failure with hypoxia: Secondary | ICD-10-CM | POA: Diagnosis not present

## 2021-12-24 LAB — SURGICAL PATHOLOGY

## 2021-12-24 MED ORDER — ENOXAPARIN SODIUM 30 MG/0.3ML IJ SOSY: 30.0000 mg | PREFILLED_SYRINGE | INTRAMUSCULAR | Status: DC

## 2021-12-24 MED ORDER — CEPHALEXIN 500 MG PO CAPS: 500.0000 mg | ORAL_CAPSULE | Freq: Two times a day (BID) | ORAL | 0 refills | Status: AC

## 2021-12-24 MED ORDER — MAGNESIUM HYDROXIDE 400 MG/5ML PO SUSP: 30.0000 mL | Freq: Every day | ORAL | 0 refills | Status: AC | PRN

## 2021-12-24 MED ORDER — METHOCARBAMOL 500 MG PO TABS: 500.0000 mg | ORAL_TABLET | Freq: Four times a day (QID) | ORAL | 0 refills | Status: DC | PRN

## 2021-12-24 MED ORDER — HYDROCODONE-ACETAMINOPHEN 5-325 MG PO TABS: 1.0000 | ORAL_TABLET | ORAL | 0 refills | Status: AC | PRN

## 2021-12-24 NOTE — Discharge Summary (Signed)
Physician Discharge Summary   Patient: Faith Vasquez MRN: [id number] DOB: [birth year]  Admit date:     12/20/2021  Discharge date: 12/24/21  Discharge Physician: Lorella Nimrod   PCP: Gladstone Lighter, MD   Recommendations at discharge:  Follow-up with your orthopedic surgeon Follow-up with primary care provider  Discharge Diagnoses: Principal Problem:   Acute hypoxemic respiratory failure ([place]) Active Problems:   Closed left hip fracture [facility])   Essential hypertension   Right hip pain   Advanced dementia ([place])   UTI (urinary tract infection)   Hospital Course: [name] is a 88 year old with memory impairment, Alzheimer's, hypertension, who presents to the ED for chief concern of fall and possible right hip failure.   Initial temperature is pending, respiration rate of 22, heart rate of 77, blood pressure 155/82, spO2 of 90% on 2 L [place].   Serum sodium is 140, potassium 2.6, chloride 107, bicarb 25, BUN of 28, serum creatinine 1.17, nonfasting blood glucose 107, WBC 8.8, hemoglobin 14.1, platelets of 142.  GFR 43.  UA was positive for large leukocytes and nitrates on 12/19/2021.  ED treatment: Cefazolin 1 g IV  7/29: She was also found to have right femoral neck fracture secondary to mechanical fall. She was seen in the ED on 12/20/21, for a fall and was able to ambulate in the ED and thus discharged from the ED. Per report, she fell again on 12/20/21 and was seen at [facility] and was found to have right hip fracture. She was sent to [facility] ED for chief concern of acute hypoxic respiratory at 87% on room air.  Orthopedic was consulted and she had her hemiarthroplasty this morning. Preliminary urine cultures positive for Klebsiella pneumoniae with good sensitivity, only resistant to ampicillin, switch antibiotics to cefazolin.  7/30: Patient with overnight agitation, son refused Haldol.  Unable to sleep the whole night.  She was quite somnolent this morning.   PT is recommending SNF.  7/31: Overnight experiencing urinary retention, bladder scan with more than 900 cc of urine, had one-time in and out catheter.  Labs stable.  8/1: Patient remained stable.  She is being discharged to for further management.  She will continue on current medications and follow-up with her providers.  Patient is high risk for deterioration and death based on age, comorbidities, recent hip fracture.  She is currently full code.  Palliative care consult.  Assessment and Plan: * Acute hypoxemic respiratory failure ([place]) - Etiology work-up in progress, query secondary to pain.  Procalcitonin negative -Continue to monitor -Continue with supplemental oxygen  wean as tolerated  Closed left hip fracture ([facility]) Secondary to mechanical fall.  S/p hemiarthroplasty on 12/21/2021 -Continue with pain management -PT OT evaluation-recommending rehab -She will be weightbearing on right leg as tolerated   Essential hypertension Blood pressure within normal limit, at times becoming soft. -Keep holding home amlodipine - Hydralazine 5 mg IV every 6 hours as needed for SBP greater than 180, 4 days ordered  Right hip pain See above  Advanced dementia [facility]) Patient becoming delirious with sundowning which is very common in this age group with advanced dementia. -Continue risperidone -Delirium precautions  UTI (urinary tract infection) - Present on admission Urine culture grew Klebsiella pneumonia which shows resistance only to ampicillin.  Patient was on ceftriaxone. -Switch ceftriaxone with cefazolin-can placed on Keflex on discharge to complete a 5-day course   Consultants: Orthopedic surgery Procedures performed: ORIF Disposition: Skilled nursing facility Diet recommendation:  Discharge Diet Orders (From  admission, onward)     Start     Ordered   12/24/21 0000  Diet - low sodium heart healthy        12/24/21 1016           Cardiac diet DISCHARGE  MEDICATION: Allergies as of 12/24/2021       Reactions   Codeine    Iodine    Macrobid [nitrofurantoin] Other (See Comments)   Unknown reaction.   Ciprofloxacin Rash   Other reaction(s): whelps        Medication List     TAKE these medications    acetaminophen 325 MG tablet Commonly known as: TYLENOL Take 2 tablets by mouth in the morning, at noon, and at bedtime. What changed: Another medication with the same name was removed. Continue taking this medication, and follow the directions you see here.   ALPRAZolam 0.25 MG tablet Commonly known as: XANAX Take 0.25 mg by mouth 2 (two) times daily as needed for anxiety.   amLODipine 5 MG tablet Commonly known as: NORVASC Take 5 mg by mouth daily.   aspirin EC 81 MG tablet Chew 81 mg by mouth daily.   cephALEXin 500 MG capsule Commonly known as: KEFLEX Take 1 capsule (500 mg total) by mouth 2 (two) times daily for 2 days. What changed: when to take this   enoxaparin 30 MG/0.3ML injection Commonly known as: LOVENOX Inject 0.3 mLs (30 mg total) into the skin daily for 14 days.   feeding supplement Liqd Take 237 mLs by mouth 2 (two) times daily between meals.   guaifenesin 400 MG Tabs tablet Commonly known as: HUMIBID E Take 400 mg by mouth every 4 (four) hours as needed.   HYDROcodone-acetaminophen 5-325 MG tablet Commonly known as: NORCO/VICODIN Take 1 tablet by mouth every 4 (four) hours as needed for moderate pain or severe pain.   magnesium hydroxide 400 MG/5ML suspension Commonly known as: MILK OF MAGNESIA Take 30 mLs by mouth daily as needed for mild constipation or moderate constipation.   methocarbamol 500 MG tablet Commonly known as: ROBAXIN Take 1 tablet (500 mg total) by mouth every 6 (six) hours as needed for muscle spasms.   ondansetron 4 MG tablet Commonly known as: ZOFRAN Take 4 mg by mouth every 6 (six) hours as needed for nausea or vomiting.   polyethylene glycol 17 g packet Commonly known  as: MIRALAX / GLYCOLAX Take 17 g by mouth 2 (two) times daily. What changed:  when to take this additional instructions   risperiDONE 0.25 MG tablet Commonly known as: RISPERDAL Take 1 tablet by mouth 2 (two) times daily.   senna-docusate 8.6-50 MG tablet Commonly known as: Senokot-S Take 2 tablets by mouth 2 (two) times daily.   Vitamin D3 50 MCG (2000 UT) Tabs Take 1 capsule by mouth daily. Wednesday               Discharge Care Instructions  (From admission, onward)           Start     Ordered   12/24/21 0000  Leave dressing on - Keep it clean, dry, and intact until clinic visit        12/24/21 1016            Contact information for follow-up providers     Duanne Guess, PA-C Follow up in 2 week(s).   Specialties: Orthopedic Surgery, Emergency Medicine Contact information: [place] [address] [phone]  Enid Baas, MD. Schedule an appointment as soon as possible for a visit in 1 week(s).   Specialty: Internal Medicine Contact information: [address] [place] [address] [phone]              Contact information for after-discharge care     Destination     [facility] .   Service: Skilled Nursing Contact information: [address] [place] [address] [phone]                    Discharge Exam: Ceasar Mons Weights   12/20/21 1625 12/20/21 2028  Weight: 62.9 kg 63 kg   General.  Frail elderly lady, in no acute distress. Pulmonary.  Lungs clear bilaterally, normal respiratory effort. CV.  Regular rate and rhythm, no JVD, rub or murmur. Abdomen.  Soft, nontender, nondistended, BS positive. CNS.  Alert and oriented .  No focal neurologic deficit. Extremities.  No edema, no cyanosis, pulses intact and symmetrical. Psychiatry.  Judgment and insight appears impaired  Condition at discharge: stable  The results of significant  diagnostics from this hospitalization (including imaging, microbiology, ancillary and laboratory) are listed below for reference.   Imaging Studies: DG HIP UNILAT WITH PELVIS 1V RIGHT  Result Date: 12/21/2021 CLINICAL DATA:  Fluoroscopic assistance for right hip arthroplasty EXAM: DG HIP (WITH OR WITHOUT PELVIS) 1V RIGHT COMPARISON:  None Available. FINDINGS: Fluoroscopic image shows right hip arthroplasty. Fluoroscopy time 1 seconds. Radiation dose 0.18 mGy. IMPRESSION: Fluoroscopic assistance was provided for right hip arthroplasty. Electronically Signed   By: Ernie Avena M.D.   On: 12/21/2021 13:25   DG HIP UNILAT W OR W/O PELVIS 2-3 VIEWS RIGHT  Result Date: 12/21/2021 CLINICAL DATA:  Status post right hip arthroplasty EXAM: DG HIP (WITH OR WITHOUT PELVIS) 2-3V RIGHT COMPARISON:  None Available. FINDINGS: There is evidence of recent right hip arthroplasty. There are pockets of air in the soft tissues adjacent to the right hip. Skin staples are noted along the lateral aspect of the right hip. There is previous left hip arthroplasty. IMPRESSION: Status post right hip arthroplasty. Electronically Signed   By: Ernie Avena M.D.   On: 12/21/2021 13:24   DG C-Arm 1-60 Min-No Report  Result Date: 12/21/2021 Fluoroscopy was utilized by the requesting physician.  No radiographic interpretation.   DG Chest Portable 1 View  Result Date: 12/20/2021 CLINICAL DATA:  Hypoxia, known hip fracture EXAM: PORTABLE CHEST 1 VIEW COMPARISON:  12/19/2021 FINDINGS: Scattered interstitial opacities, right lower lobe predominant, favoring scarring when correlating with prior. No focal consolidation. No pleural effusion or pneumothorax. The heart is normal in size.  Thoracic aortic atherosclerosis. IMPRESSION: No evidence of acute cardiopulmonary disease. Electronically Signed   By: Charline Bills M.D.   On: 12/20/2021 17:02   DG Chest Portable 1 View  Result Date: 12/19/2021 CLINICAL DATA:  Tachypnea  EXAM: PORTABLE CHEST 1 VIEW COMPARISON:  09/19/2021 FINDINGS: Stable cardiomediastinal contours. Aortic atherosclerosis. Low lung volumes. Bibasilar interstitial opacities, not appreciably changed from prior. No pleural effusion or pneumothorax. IMPRESSION: Low lung volumes with bibasilar interstitial opacities, not appreciably changed from prior. Electronically Signed   By: Duanne Guess D.O.   On: 12/19/2021 11:16   CT HEAD WO CONTRAST ( )  Result Date: 12/19/2021 CLINICAL DATA:  Head trauma, minor (Age >= 65y); Neck trauma (Age >= 65y) EXAM: CT HEAD WITHOUT CONTRAST CT CERVICAL SPINE WITHOUT CONTRAST TECHNIQUE: Multidetector CT imaging of the head and cervical spine was performed following  the standard protocol without intravenous contrast. Multiplanar CT image reconstructions of the cervical spine were also generated. RADIATION DOSE REDUCTION: This exam was performed according to the departmental dose-optimization program which includes automated exposure control, adjustment of the mA and/or kV according to patient size and/or use of iterative reconstruction technique. COMPARISON:  August 13, 2018. FINDINGS: CT HEAD FINDINGS Brain: No evidence of acute infarction, hemorrhage, hydrocephalus, extra-axial collection or mass lesion/mass effect. Chronic microvascular disease. Vascular: No hyperdense vessel identified. Skull: No acute fracture. Sinuses/Orbits: Clear sinuses.  No acute orbital findings. Other: No mastoid effusions. CT CERVICAL SPINE FINDINGS Alignment: Mild reversal the normal cervical lordosis. Mild anterolisthesis of C2 on C3 and C3 on C4. Skull base and vertebrae: Vertebral heights are maintained. No evidence of acute fracture. Soft tissues and spinal canal: No prevertebral fluid or swelling. No visible canal hematoma. Disc levels: Severe multilevel degenerative disease, greatest at C4-C5 C5-C6 and C6-C7. Multilevel facet and uncovertebral hypertrophy with varying degrees of neural foraminal  stenosis. Upper chest: Biapical pleuroparenchymal scarring. IMPRESSION: 1. No evidence of acute intracranial abnormality. 2. No evidence of acute fracture or traumatic malalignment in the cervical spine. Electronically Signed   By: Margaretha Sheffield M.D.   On: 12/19/2021 09:32   CT Cervical Spine Wo Contrast  Result Date: 12/19/2021 CLINICAL DATA:  Head trauma, minor (Age >= 65y); Neck trauma (Age >= 65y) EXAM: CT HEAD WITHOUT CONTRAST CT CERVICAL SPINE WITHOUT CONTRAST TECHNIQUE: Multidetector CT imaging of the head and cervical spine was performed following the standard protocol without intravenous contrast. Multiplanar CT image reconstructions of the cervical spine were also generated. RADIATION DOSE REDUCTION: This exam was performed according to the departmental dose-optimization program which includes automated exposure control, adjustment of the mA and/or kV according to patient size and/or use of iterative reconstruction technique. COMPARISON:  August 13, 2018. FINDINGS: CT HEAD FINDINGS Brain: No evidence of acute infarction, hemorrhage, hydrocephalus, extra-axial collection or mass lesion/mass effect. Chronic microvascular disease. Vascular: No hyperdense vessel identified. Skull: No acute fracture. Sinuses/Orbits: Clear sinuses.  No acute orbital findings. Other: No mastoid effusions. CT CERVICAL SPINE FINDINGS Alignment: Mild reversal the normal cervical lordosis. Mild anterolisthesis of C2 on C3 and C3 on C4. Skull base and vertebrae: Vertebral heights are maintained. No evidence of acute fracture. Soft tissues and spinal canal: No prevertebral fluid or swelling. No visible canal hematoma. Disc levels: Severe multilevel degenerative disease, greatest at C4-C5 C5-C6 and C6-C7. Multilevel facet and uncovertebral hypertrophy with varying degrees of neural foraminal stenosis. Upper chest: Biapical pleuroparenchymal scarring. IMPRESSION: 1. No evidence of acute intracranial abnormality. 2. No evidence of  acute fracture or traumatic malalignment in the cervical spine. Electronically Signed   By: Margaretha Sheffield M.D.   On: 12/19/2021 09:32    Microbiology: Results for orders placed or performed during the hospital encounter of 12/20/21  SARS Coronavirus 2 by RT PCR (hospital order, performed in [facility] hospital lab) *cepheid single result test* Anterior Nasal Swab     Status: None   Collection Time: 12/20/21  6:52 PM   Specimen: Anterior Nasal Swab  Result Value Ref Range Status   SARS Coronavirus 2 by RT PCR NEGATIVE NEGATIVE Final    Comment: (NOTE) SARS-CoV-2 target nucleic acids are NOT DETECTED.  The SARS-CoV-2 RNA is generally detectable in upper and lower respiratory specimens during the acute phase of infection. The lowest concentration of SARS-CoV-2 viral copies this assay can detect is 250 copies / mL. A negative result does not preclude SARS-CoV-2 infection  and should not be used as the sole basis for treatment or other patient management decisions.  A negative result may occur with improper specimen collection / handling, submission of specimen other than nasopharyngeal swab, presence of viral mutation(s) within the areas targeted by this assay, and inadequate number of viral copies (<250 copies / mL). A negative result must be combined with clinical observations, patient history, and epidemiological information.  Fact Sheet for Patients:   RoadLapTop.co.za  Fact Sheet for Healthcare Providers: http://kim-miller.com/  This test is not yet approved or  cleared by the [place] FDA and has been authorized for detection and/or diagnosis of SARS-CoV-2 by FDA under an Emergency Use Authorization (EUA).  This EUA will remain in effect (meaning this test can be used) for the duration of the COVID-19 declaration under Section 564(b)(1) of the Act, 21 U.S.C. section 360bbb-3(b)(1), unless the authorization is terminated  or revoked sooner.  Performed at [facility], [address] [place], [place] [address]     Labs: CBC: Recent Labs  Lab 12/19/21 [phone] 12/20/21 1646 12/21/21 1533 12/22/21 0535 12/23/21 0436  WBC 8.6 8.8 7.5 8.0 7.2  NEUTROABS  --  7.3  --   --   --   HGB 13.0 14.1 12.8 11.8* 11.2*  HCT 40.6 44.2 40.4 36.8 34.8*  MCV 86.4 87.0 86.9 86.2 86.6  PLT 145* 142* 117* 120* 107*   Basic Metabolic Panel: Recent Labs  Lab 12/19/21 0857 12/20/21 1646 12/21/21 0254 12/21/21 1533 12/22/21 0535  NA 141 140 138  --  136  K 3.8 3.6 3.7  --  3.7  CL 111 107 106  --  108  CO2 24 25 25   --  21*  GLUCOSE 106* 107* 121*  --  106*  BUN 21 28* 25*  --  24*  CREATININE 1.03* 1.17* 1.13* 1.05* 0.96  CALCIUM 9.7 9.9 9.5  --  8.9   Liver Function Tests: No results for input(s): "AST", "ALT", "ALKPHOS", "BILITOT", "PROT", "ALBUMIN" in the last 168 hours. CBG: No results for input(s): "GLUCAP" in the last 168 hours.  Discharge time spent: greater than 30 minutes.  This record has been created using . Errors have been sought and corrected,but may not always be located. Such creation errors do not reflect on the standard of care.   Signed: Conservation officer, historic buildings, MD [facility] 12/24/2021

## 2021-12-24 NOTE — TOC Progression Note (Signed)
Transition of Care [facility]) - Progression Note    Patient Details  Name: Faith Vasquez MRN: [id number] Date of Birth: 18-Nov-1922  [facility] CM/SW Contact  Marlowe Sax, RN Phone Number: 12/24/2021, 10:46 AM  Clinical Narrative:    Patient to go to [facility], room 101  Spoke with Jaci Lazier and provided the room number, called EMS to transport   Expected Discharge Plan: Skilled Nursing Facility Barriers to Discharge: Continued Medical Work up  Expected Discharge Plan and Services Expected Discharge Plan: Skilled Nursing Facility   Discharge Planning Services: CM Consult   Living arrangements for the past 2 months: Group Home Expected Discharge Date: 12/24/21                                     Social Determinants of Health (SDOH) Interventions    Readmission Risk Interventions     No data to display

## 2021-12-24 NOTE — Progress Notes (Signed)
Physical Therapy Treatment Patient Details Name: Faith Vasquez MRN: [id number] DOB: 11/15/1922 Today's Date: 12/24/2021   History of Present Illness Patient is a 88 year old female who underwent anterior approach hemi arthroplasty 12/21/21 s/p fall with R hip failure. PMH includes memory impairment, Alzheimer's, HTN. Patient was seen in ED on 12/20/21 and was able to ambulate so was discharged. Per report patient fell again on 12/20/21 and was seen at [facility] clinic and found to have R hip fracture.    PT Comments    Patient wakes to voice at start of session, does intermittently close her eyes today, able to redirect or follow commands with repetition as needed. Demonstrated pain signs/symptoms with any RLE movement, though appeared to be less than last session. Supine to sit maxAx2, and with extended time, propping, repositioning the pt did progress to sitting with CGA. Does attempt to spontaneously return to supine 1-2 times. With max encouragement and education, Sit <> stand with RW and modAx2, second attempt closer to maxAx2 due to pt attempting to return to sitting throughout. Unable to truly move her feet today for any steps. Returned to supine modAx2, and pt quickly returns to sleep. The patient would benefit from further skilled PT intervention to continue to progress towards goals as able. Recommendation remains appropriate.    Recommendations for follow up therapy are one component of a multi-disciplinary discharge planning process, led by the attending physician.  Recommendations may be updated based on patient status, additional functional criteria and insurance authorization.  Follow Up Recommendations  Skilled nursing-short term rehab (<3 hours/day) Can patient physically be transported by private vehicle: No (not yet at this time. May be able to by discharge)   Assistance Recommended at Discharge Frequent or constant Supervision/Assistance  Patient can return home with the  following A lot of help with walking and/or transfers;A lot of help with bathing/dressing/bathroom;Direct supervision/assist for medications management;Assistance with cooking/housework;Help with stairs or ramp for entrance   Equipment Recommendations  BSC/3in1    Recommendations for Other Services       Precautions / Restrictions Precautions Precautions: Anterior Hip;Fall Precaution Booklet Issued: Yes (comment) Restrictions Weight Bearing Restrictions: Yes RLE Weight Bearing: Weight bearing as tolerated     Mobility  Bed Mobility Overal bed mobility: Needs Assistance Bed Mobility: Supine to Sit, Sit to Supine     Supine to sit: Max assist, HOB elevated, +2 for physical assistance Sit to supine: Mod assist, +2 for physical assistance        Transfers Overall transfer level: Needs assistance Equipment used: Rolling walker (2 wheels) Transfers: Sit to/from Stand Sit to Stand: +2 safety/equipment, Mod assist           General transfer comment: second attempt closer to maxAx2, pt starts to actively attempt to return to sitting during standing    Ambulation/Gait               General Gait Details: only one small sideways shuffle today, pt actively attempting to return to sitting   Stairs             Wheelchair Mobility    Modified Rankin (Stroke Patients Only)       Balance Overall balance assessment: Needs assistance Sitting-balance support: Feet supported, Bilateral upper extremity supported Sitting balance-Leahy Scale: Fair Sitting balance - Comments: with time, propping, cues, pt did sit with CGA occasionally   Standing balance support: Bilateral upper extremity supported, Reliant on assistive device for balance Standing balance-Leahy Scale: Poor Standing balance comment:  1st attempt better than second, second attempt required maxAx2 to maintain standing                            Cognition Arousal/Alertness: Awake/alert,  Lethargic (variable but follows commands) Behavior During Therapy: Anxious                                            Exercises      General Comments        Pertinent Vitals/Pain Pain Assessment Pain Assessment: Faces Faces Pain Scale: Hurts even more Pain Location: any movement of R hip Pain Descriptors / Indicators: Guarding, Grimacing, Moaning Pain Intervention(s): Limited activity within patient's tolerance, Monitored during session, Repositioned, Premedicated before session    Home Living                          Prior Function            PT Goals (current goals can now be found in the care plan section) Progress towards PT goals: Progressing toward goals (limited due to pain, cognition)    Frequency    7X/week      PT Plan Current plan remains appropriate    Co-evaluation PT/OT/SLP Co-Evaluation/Treatment: Yes Reason for Co-Treatment: Necessary to address cognition/behavior during functional activity;For patient/therapist safety;To address functional/ADL transfers PT goals addressed during session: Mobility/safety with mobility;Balance;Strengthening/ROM OT goals addressed during session: ADL's and self-care;Proper use of Adaptive equipment and DME      AM-PAC PT "6 Clicks" Mobility   Outcome Measure  Help needed turning from your back to your side while in a flat bed without using bedrails?: A Lot Help needed moving from lying on your back to sitting on the side of a flat bed without using bedrails?: A Lot Help needed moving to and from a bed to a chair (including a wheelchair)?: A Lot Help needed standing up from a chair using your arms (e.g., wheelchair or bedside chair)?: A Lot Help needed to walk in hospital room?: Total Help needed climbing 3-5 steps with a railing? : Total 6 Click Score: 10    End of Session Equipment Utilized During Treatment: Gait belt;Oxygen (4 L of oxygen via nasal cannula) Activity Tolerance:  Patient limited by pain Patient left: in bed;with call bell/phone within reach;with bed alarm set;with family/visitor present Nurse Communication: Mobility status PT Visit Diagnosis: Unsteadiness on feet (R26.81);Other abnormalities of gait and mobility (R26.89);Muscle weakness (generalized) (M62.81);History of falling (Z91.81);Difficulty in walking, not elsewhere classified (R26.2);Pain Pain - Right/Left: Right Pain - part of body: Hip     Time: 2694-8546 PT Time Calculation (min) (ACUTE ONLY): 14 min  Charges:  $Therapeutic Activity: 8-22 mins                     Olga Coaster PT, DPT 11:56 AM,12/24/21

## 2021-12-24 NOTE — Progress Notes (Addendum)
   Subjective: 3 Days Post-Op Procedure(s) (LRB): ANTERIOR APPROACH HEMI HIP ARTHROPLASTY (Right) Patient reports pain as mild.   Patient is well, and has had no acute complaints or problems Denies any CP, SOB, ABD pain. We will continue therapy today.  Plan is to go Skilled nursing facility after hospital stay.  Objective: Vital signs in last 24 hours: Temp:  [97.1 F (36.2 C)-97.9 F (36.6 C)] 97.9 F (36.6 C) (08/01 0815) Pulse Rate:  [77-94] 82 (08/01 0815) Resp:  [16-17] 16 (08/01 0815) BP: (105-137)/(53-83) 105/53 (08/01 0815) SpO2:  [90 %-97 %] 90 % (08/01 0815)  Intake/Output from previous day: 07/31 0701 - 08/01 0700 In: 480 [P.O.:480] Out: 500 [Urine:500] Intake/Output this shift: No intake/output data recorded.  Recent Labs    12/21/21 1533 12/22/21 0535 12/23/21 0436  HGB 12.8 11.8* 11.2*   Recent Labs    12/22/21 0535 12/23/21 0436  WBC 8.0 7.2  RBC 4.27 4.02  HCT 36.8 34.8*  PLT 120* 107*   Recent Labs    12/21/21 1533 12/22/21 0535  NA  --  136  K  --  3.7  CL  --  108  CO2  --  21*  BUN  --  24*  CREATININE 1.05* 0.96  GLUCOSE  --  106*  CALCIUM  --  8.9   No results for input(s): "LABPT", "INR" in the last 72 hours.  EXAM General - Patient is Alert, Appropriate, and Oriented Extremity - Neurovascular intact Sensation intact distally Intact pulses distally Dorsiflexion/Plantar flexion intact Dressing - dressing C/D/I and no drainage Motor Function - intact, moving foot and toes well on exam.   Past Medical History:  Diagnosis Date   Diverticulosis    History of Clostridium difficile infection    uncertain dates, possibly in 2017 or 2018    Assessment/Plan:   3 Days Post-Op Procedure(s) (LRB): ANTERIOR APPROACH HEMI HIP ARTHROPLASTY (Right) Principal Problem:   Acute hypoxemic respiratory failure (HCC) Active Problems:   Closed left hip fracture (HCC)   Essential hypertension   Advanced dementia (HCC)   Right hip  pain   UTI (urinary tract infection)  Estimated body mass index is 23.11 kg/m as calculated from the following:   Height as of this encounter: 5\' 5"  (1.651 m).   Weight as of this encounter: 63 kg. Advance diet Up with therapy Pain controlled with norco VSS Labs stable CM to assist with discharge to SNF today   Follow up with [facility] in 2 weeks Lovenox 30 mg SQ daily x 14 days at discharge  DVT Prophylaxis - Lovenox, TED hose, and SCDs Weight-Bearing as tolerated to right leg   T. , PA-C [facility] 12/24/2021, 10:15 AM

## 2021-12-26 DIAGNOSIS — M84459A Pathological fracture, hip, unspecified, initial encounter for fracture: Secondary | ICD-10-CM | POA: Diagnosis not present

## 2021-12-26 DIAGNOSIS — F05 Delirium due to known physiological condition: Secondary | ICD-10-CM | POA: Diagnosis not present

## 2021-12-26 DIAGNOSIS — K5900 Constipation, unspecified: Secondary | ICD-10-CM | POA: Diagnosis not present

## 2021-12-26 DIAGNOSIS — I10 Essential (primary) hypertension: Secondary | ICD-10-CM | POA: Diagnosis not present

## 2021-12-30 DIAGNOSIS — F05 Delirium due to known physiological condition: Secondary | ICD-10-CM | POA: Diagnosis not present

## 2022-03-16 ENCOUNTER — Inpatient Hospital Stay: Payer: Medicare Other

## 2022-03-16 ENCOUNTER — Other Ambulatory Visit: Payer: Self-pay

## 2022-03-16 ENCOUNTER — Emergency Department: Payer: Medicare Other

## 2022-03-16 ENCOUNTER — Inpatient Hospital Stay
Admission: EM | Admit: 2022-03-16 | Discharge: 2022-03-19 | DRG: 871 | Disposition: A | Discharge status: Skilled Nursing Facility | Payer: Medicare Other | Source: Skilled Nursing Facility | Attending: Internal Medicine | Admitting: Internal Medicine

## 2022-03-16 DIAGNOSIS — N1831 Chronic kidney disease, stage 3a: Secondary | ICD-10-CM | POA: Diagnosis present

## 2022-03-16 DIAGNOSIS — W19XXXA Unspecified fall, initial encounter: Secondary | ICD-10-CM | POA: Diagnosis not present

## 2022-03-16 DIAGNOSIS — D696 Thrombocytopenia, unspecified: Secondary | ICD-10-CM | POA: Diagnosis present

## 2022-03-16 DIAGNOSIS — I129 Hypertensive chronic kidney disease with stage 1 through stage 4 chronic kidney disease, or unspecified chronic kidney disease: Secondary | ICD-10-CM | POA: Diagnosis present

## 2022-03-16 DIAGNOSIS — N390 Urinary tract infection, site not specified: Secondary | ICD-10-CM | POA: Diagnosis present

## 2022-03-16 DIAGNOSIS — Z20822 Contact with and (suspected) exposure to covid-19: Secondary | ICD-10-CM | POA: Diagnosis present

## 2022-03-16 DIAGNOSIS — R9389 Abnormal findings on diagnostic imaging of other specified body structures: Secondary | ICD-10-CM | POA: Diagnosis present

## 2022-03-16 DIAGNOSIS — Z1611 Resistance to penicillins: Secondary | ICD-10-CM | POA: Diagnosis present

## 2022-03-16 DIAGNOSIS — A4151 Sepsis due to Escherichia coli [E. coli]: Principal | ICD-10-CM | POA: Diagnosis present

## 2022-03-16 DIAGNOSIS — D6959 Other secondary thrombocytopenia: Secondary | ICD-10-CM | POA: Diagnosis present

## 2022-03-16 DIAGNOSIS — A419 Sepsis, unspecified organism: Secondary | ICD-10-CM | POA: Diagnosis not present

## 2022-03-16 DIAGNOSIS — Z7982 Long term (current) use of aspirin: Secondary | ICD-10-CM | POA: Diagnosis not present

## 2022-03-16 DIAGNOSIS — W1830XA Fall on same level, unspecified, initial encounter: Secondary | ICD-10-CM | POA: Diagnosis present

## 2022-03-16 DIAGNOSIS — R54 Age-related physical debility: Secondary | ICD-10-CM | POA: Diagnosis present

## 2022-03-16 DIAGNOSIS — R509 Fever, unspecified: Secondary | ICD-10-CM | POA: Diagnosis not present

## 2022-03-16 DIAGNOSIS — Z66 Do not resuscitate: Secondary | ICD-10-CM | POA: Diagnosis present

## 2022-03-16 DIAGNOSIS — Z90710 Acquired absence of both cervix and uterus: Secondary | ICD-10-CM | POA: Diagnosis not present

## 2022-03-16 DIAGNOSIS — R6520 Severe sepsis without septic shock: Secondary | ICD-10-CM | POA: Diagnosis present

## 2022-03-16 DIAGNOSIS — S8002XA Contusion of left knee, initial encounter: Secondary | ICD-10-CM | POA: Diagnosis present

## 2022-03-16 DIAGNOSIS — R9431 Abnormal electrocardiogram [ECG] [EKG]: Secondary | ICD-10-CM | POA: Diagnosis not present

## 2022-03-16 DIAGNOSIS — G9340 Encephalopathy, unspecified: Secondary | ICD-10-CM

## 2022-03-16 DIAGNOSIS — Z79899 Other long term (current) drug therapy: Secondary | ICD-10-CM | POA: Diagnosis not present

## 2022-03-16 DIAGNOSIS — Z515 Encounter for palliative care: Secondary | ICD-10-CM | POA: Diagnosis not present

## 2022-03-16 DIAGNOSIS — N179 Acute kidney failure, unspecified: Secondary | ICD-10-CM | POA: Diagnosis present

## 2022-03-16 DIAGNOSIS — R410 Disorientation, unspecified: Secondary | ICD-10-CM

## 2022-03-16 DIAGNOSIS — R0902 Hypoxemia: Secondary | ICD-10-CM | POA: Diagnosis present

## 2022-03-16 DIAGNOSIS — N3000 Acute cystitis without hematuria: Secondary | ICD-10-CM

## 2022-03-16 DIAGNOSIS — F03A4 Unspecified dementia, mild, with anxiety: Secondary | ICD-10-CM | POA: Diagnosis present

## 2022-03-16 DIAGNOSIS — F03C0 Unspecified dementia, severe, without behavioral disturbance, psychotic disturbance, mood disturbance, and anxiety: Secondary | ICD-10-CM | POA: Diagnosis present

## 2022-03-16 DIAGNOSIS — J690 Pneumonitis due to inhalation of food and vomit: Secondary | ICD-10-CM | POA: Diagnosis present

## 2022-03-16 DIAGNOSIS — F03C11 Unspecified dementia, severe, with agitation: Secondary | ICD-10-CM | POA: Diagnosis not present

## 2022-03-16 DIAGNOSIS — Z96643 Presence of artificial hip joint, bilateral: Secondary | ICD-10-CM | POA: Diagnosis present

## 2022-03-16 DIAGNOSIS — K219 Gastro-esophageal reflux disease without esophagitis: Secondary | ICD-10-CM | POA: Diagnosis present

## 2022-03-16 DIAGNOSIS — I10 Essential (primary) hypertension: Secondary | ICD-10-CM | POA: Diagnosis present

## 2022-03-16 DIAGNOSIS — G9341 Metabolic encephalopathy: Secondary | ICD-10-CM | POA: Diagnosis present

## 2022-03-16 DIAGNOSIS — J189 Pneumonia, unspecified organism: Secondary | ICD-10-CM | POA: Diagnosis not present

## 2022-03-16 DIAGNOSIS — F419 Anxiety disorder, unspecified: Secondary | ICD-10-CM | POA: Diagnosis present

## 2022-03-16 LAB — URINALYSIS, COMPLETE (UACMP) WITH MICROSCOPIC
Bilirubin Urine: NEGATIVE
Glucose, UA: NEGATIVE mg/dL
Ketones, ur: NEGATIVE mg/dL
Nitrite: POSITIVE — AB
Protein, ur: 100 mg/dL — AB
Specific Gravity, Urine: 1.014 (ref 1.005–1.030)
WBC, UA: >50 WBC/hpf — ABNORMAL HIGH (ref 0–5)
pH: 5 (ref 5.0–8.0)

## 2022-03-16 LAB — PROCALCITONIN: Procalcitonin: 3.65 ng/mL

## 2022-03-16 LAB — CBC WITH DIFFERENTIAL/PLATELET
Abs Immature Granulocytes: 0.03 10*3/uL (ref 0.00–0.07)
Basophils Absolute: 0 10*3/uL (ref 0.0–0.1)
Basophils Relative: 1 %
Eosinophils Absolute: 0.1 10*3/uL (ref 0.0–0.5)
Eosinophils Relative: 1 %
HCT: 40.9 % (ref 36.0–46.0)
Hemoglobin: 12.7 g/dL (ref 12.0–15.0)
Immature Granulocytes: 0 %
Lymphocytes Relative: 3 %
Lymphs Abs: 0.2 10*3/uL — ABNORMAL LOW (ref 0.7–4.0)
MCH: 27.1 pg (ref 26.0–34.0)
MCHC: 31.1 g/dL (ref 30.0–36.0)
MCV: 87.4 fL (ref 80.0–100.0)
Monocytes Absolute: 0.5 10*3/uL (ref 0.1–1.0)
Monocytes Relative: 7 %
Neutro Abs: 6.1 10*3/uL (ref 1.7–7.7)
Neutrophils Relative %: 88 %
Platelets: 120 10*3/uL — ABNORMAL LOW (ref 150–400)
RBC: 4.68 MIL/uL (ref 3.87–5.11)
RDW: 16.2 % — ABNORMAL HIGH (ref 11.5–15.5)
WBC: 7 10*3/uL (ref 4.0–10.5)
nRBC: 0 % (ref 0.0–0.2)

## 2022-03-16 LAB — COMPREHENSIVE METABOLIC PANEL
ALT: 18 U/L (ref 0–44)
AST: 48 U/L — ABNORMAL HIGH (ref 15–41)
Albumin: 3.4 g/dL — ABNORMAL LOW (ref 3.5–5.0)
Alkaline Phosphatase: 75 U/L (ref 38–126)
Anion gap: 10 (ref 5–15)
BUN: 34 mg/dL — ABNORMAL HIGH (ref 8–23)
CO2: 21 mmol/L — ABNORMAL LOW (ref 22–32)
Calcium: 9.5 mg/dL (ref 8.9–10.3)
Chloride: 109 mmol/L (ref 98–111)
Creatinine, Ser: 1.21 mg/dL — ABNORMAL HIGH (ref 0.44–1.00)
GFR, Estimated: 41 mL/min — ABNORMAL LOW (ref >60)
Glucose, Bld: 130 mg/dL — ABNORMAL HIGH (ref 70–99)
Potassium: 4 mmol/L (ref 3.5–5.1)
Sodium: 140 mmol/L (ref 135–145)
Total Bilirubin: 1.5 mg/dL — ABNORMAL HIGH (ref 0.3–1.2)
Total Protein: 6.4 g/dL — ABNORMAL LOW (ref 6.5–8.1)

## 2022-03-16 LAB — LACTIC ACID, PLASMA
Lactic Acid, Venous: 2.9 mmol/L (ref 0.5–1.9)
Lactic Acid, Venous: 2.8 mmol/L (ref 0.5–1.9)

## 2022-03-16 LAB — TECHNOLOGIST SMEAR REVIEW

## 2022-03-16 LAB — PROTIME-INR
INR: 1.2 (ref 0.8–1.2)
Prothrombin Time: 15.5 seconds — ABNORMAL HIGH (ref 11.4–15.2)

## 2022-03-16 LAB — RESP PANEL BY RT-PCR (FLU A&B, COVID) ARPGX2
Influenza A by PCR: NEGATIVE
Influenza B by PCR: NEGATIVE
SARS Coronavirus 2 by RT PCR: NEGATIVE

## 2022-03-16 LAB — MRSA NEXT GEN BY PCR, NASAL: MRSA by PCR Next Gen: NOT DETECTED

## 2022-03-16 LAB — MAGNESIUM: Magnesium: 2 mg/dL (ref 1.7–2.4)

## 2022-03-16 LAB — APTT: aPTT: 37 seconds — ABNORMAL HIGH (ref 24–36)

## 2022-03-16 MED ORDER — DM-GUAIFENESIN ER 30-600 MG PO TB12: 1.0000 | ORAL_TABLET | Freq: Two times a day (BID) | ORAL | Status: DC | PRN

## 2022-03-16 MED ORDER — SODIUM CHLORIDE 0.9 % IV SOLN: INTRAVENOUS | Status: DC

## 2022-03-16 MED ORDER — ALBUTEROL SULFATE (2.5 MG/3ML) 0.083% IN NEBU: 2.5000 mg | INHALATION_SOLUTION | RESPIRATORY_TRACT | Status: DC | PRN

## 2022-03-16 MED ORDER — HYDROCODONE-ACETAMINOPHEN 5-325 MG PO TABS
1.0000 | ORAL_TABLET | ORAL | Status: DC | PRN
  Administered 2022-03-19: 1 via ORAL
  Filled 2022-03-16 (×2): qty 1

## 2022-03-16 MED ORDER — POLYETHYLENE GLYCOL 3350 17 G PO PACK
17.0000 g | PACK | Freq: Two times a day (BID) | ORAL | Status: DC
  Administered 2022-03-17 – 2022-03-19 (×4): 17 g via ORAL
  Filled 2022-03-16 (×4): qty 1

## 2022-03-16 MED ORDER — MAGNESIUM HYDROXIDE 400 MG/5ML PO SUSP: 30.0000 mL | Freq: Every day | ORAL | Status: DC | PRN

## 2022-03-16 MED ORDER — SENNOSIDES-DOCUSATE SODIUM 8.6-50 MG PO TABS
2.0000 | ORAL_TABLET | Freq: Two times a day (BID) | ORAL | Status: DC
  Administered 2022-03-17 – 2022-03-19 (×3): 2 via ORAL
  Filled 2022-03-16 (×5): qty 2

## 2022-03-16 MED ORDER — ENOXAPARIN SODIUM 30 MG/0.3ML IJ SOSY
30.0000 mg | PREFILLED_SYRINGE | INTRAMUSCULAR | Status: DC
  Administered 2022-03-16 – 2022-03-18 (×3): 30 mg via SUBCUTANEOUS
  Filled 2022-03-16 (×3): qty 0.3

## 2022-03-16 MED ORDER — VANCOMYCIN HCL IN DEXTROSE 1-5 GM/200ML-% IV SOLN
1000.0000 mg | Freq: Once | INTRAVENOUS | Status: AC
  Administered 2022-03-16: 1000 mg via INTRAVENOUS
  Filled 2022-03-16: qty 200

## 2022-03-16 MED ORDER — SODIUM CHLORIDE 0.9 % IV BOLUS
1000.0000 mL | Freq: Once | INTRAVENOUS | Status: AC
  Administered 2022-03-16: 1000 mL via INTRAVENOUS

## 2022-03-16 MED ORDER — METRONIDAZOLE 500 MG/100ML IV SOLN
500.0000 mg | Freq: Once | INTRAVENOUS | Status: AC
  Administered 2022-03-16: 500 mg via INTRAVENOUS
  Filled 2022-03-16: qty 100

## 2022-03-16 MED ORDER — SODIUM CHLORIDE 0.9 % IV SOLN: 2.0000 g | INTRAVENOUS | Status: DC

## 2022-03-16 MED ORDER — ACETAMINOPHEN 325 MG PO TABS: 650.0000 mg | ORAL_TABLET | Freq: Four times a day (QID) | ORAL | Status: DC | PRN

## 2022-03-16 MED ORDER — SODIUM CHLORIDE 0.9 % IV SOLN
2.0000 g | Freq: Once | INTRAVENOUS | Status: AC
  Administered 2022-03-16: 2 g via INTRAVENOUS
  Filled 2022-03-16: qty 12.5

## 2022-03-16 MED ORDER — DIPHENHYDRAMINE HCL 50 MG/ML IJ SOLN: 12.5000 mg | Freq: Three times a day (TID) | INTRAMUSCULAR | Status: DC | PRN

## 2022-03-16 MED ORDER — ACETAMINOPHEN 325 MG RE SUPP
650.0000 mg | Freq: Once | RECTAL | Status: AC
  Administered 2022-03-16: 650 mg via RECTAL
  Filled 2022-03-16: qty 2

## 2022-03-16 MED ORDER — ALPRAZOLAM 0.25 MG PO TABS
0.2500 mg | ORAL_TABLET | Freq: Every day | ORAL | Status: DC
  Administered 2022-03-17 – 2022-03-18 (×3): 0.25 mg via ORAL
  Filled 2022-03-16 (×4): qty 1

## 2022-03-16 MED ORDER — VANCOMYCIN HCL 750 MG/150ML IV SOLN: 750.0000 mg | INTRAVENOUS | Status: DC

## 2022-03-16 MED ORDER — METRONIDAZOLE 500 MG/100ML IV SOLN
500.0000 mg | Freq: Two times a day (BID) | INTRAVENOUS | Status: DC
  Administered 2022-03-17 – 2022-03-19 (×5): 500 mg via INTRAVENOUS
  Filled 2022-03-16 (×6): qty 100

## 2022-03-16 MED ORDER — ASPIRIN 81 MG PO TBEC
81.0000 mg | DELAYED_RELEASE_TABLET | Freq: Every day | ORAL | Status: DC
  Administered 2022-03-17 – 2022-03-19 (×3): 81 mg via ORAL
  Filled 2022-03-16 (×3): qty 1

## 2022-03-16 MED ORDER — LACTATED RINGERS IV BOLUS (SEPSIS)
1000.0000 mL | Freq: Once | INTRAVENOUS | Status: AC
  Administered 2022-03-16: 1000 mL via INTRAVENOUS

## 2022-03-16 MED ORDER — HYDRALAZINE HCL 20 MG/ML IJ SOLN: 5.0000 mg | INTRAMUSCULAR | Status: DC | PRN

## 2022-03-16 MED ORDER — ACETAMINOPHEN 650 MG RE SUPP: 650.0000 mg | Freq: Four times a day (QID) | RECTAL | Status: DC | PRN

## 2022-03-16 NOTE — ED Provider Notes (Signed)
[facility] Provider Note    Event Date/Time   First MD Initiated Contact with Patient 03/16/22 1346     (approximate)   History   Altered Mental Status, Fever, and Code Sepsis   HPI  Faith Vasquez is a 88 y.o. female here with altered mental status.  History provided primarily by the patient's daughter-in-law.  Per report, patient has had increasing confusion and weakness over the last day.  This is mostly from her nursing facility.  She has mild dementia but is normally more alert and interactive.  She started having significant chills and actually fell 3 times over the last 24 hours.  When family came to check on her today she was essentially minimally responsive, tachycardic, and "out of it."  She seemed to be breathing very quickly.  It is unclear how long she was like this.  They asked the facility call 911 and she subsequently arrives for further treatment.     Physical Exam   Triage Vital Signs: ED Triage Vitals [03/16/22 1342]  Enc Vitals Group     BP (!) 151/72     Pulse Rate (!) 125     Resp (!) 22     Temp (!) 103.5 F (39.7 C)     Temp Source Rectal     SpO2 (!) 89 %     Weight 130 lb (59 kg)     Height 5\' 5"  (1.651 m)     Head Circumference      Peak Flow      Pain Score      Pain Loc      Pain Edu?      Excl. in [place]?     Most recent vital signs: Vitals:   03/16/22 1800 03/16/22 2001  BP: 136/74 124/62  Pulse: 76 69  Resp: (!) 22 (!) 29  Temp: 98 F (36.7 C) 97.7 F (36.5 C)  SpO2: 95% 97%     General: Awake, no distress.  CV:  Good peripheral perfusion.  Tachycardic. Resp:  Tachypneic with Rales, worse in the right lower lobes. Abd:  No distention.  No tenderness. Other:  Oriented to person and place as well as situation, but not time.  No focal deficits.  Dry mucous membranes.   ED Results / Procedures / Treatments   Labs (all labs ordered are listed, but only abnormal results are displayed) Labs Reviewed   LACTIC ACID, PLASMA - Abnormal; Notable for the following components:      Result Value   Lactic Acid, Venous 2.8 (*)    All other components within normal limits  LACTIC ACID, PLASMA - Abnormal; Notable for the following components:   Lactic Acid, Venous 2.9 (*)    All other components within normal limits  COMPREHENSIVE METABOLIC PANEL - Abnormal; Notable for the following components:   CO2 21 (*)    Glucose, Bld 130 (*)    BUN 34 (*)    Creatinine, Ser 1.21 (*)    Total Protein 6.4 (*)    Albumin 3.4 (*)    AST 48 (*)    Total Bilirubin 1.5 (*)    GFR, Estimated 41 (*)    All other components within normal limits  CBC WITH DIFFERENTIAL/PLATELET - Abnormal; Notable for the following components:   RDW 16.2 (*)    Platelets 120 (*)    Lymphs Abs 0.2 (*)    All other components within normal limits  PROTIME-INR - Abnormal; Notable for the following  components:   Prothrombin Time 15.5 (*)    All other components within normal limits  APTT - Abnormal; Notable for the following components:   aPTT 37 (*)    All other components within normal limits  URINALYSIS, COMPLETE (UACMP) WITH MICROSCOPIC - Abnormal; Notable for the following components:   Color, Urine YELLOW (*)    APPearance HAZY (*)    Hgb urine dipstick MODERATE (*)    Protein, ur 100 (*)    Nitrite POSITIVE (*)    Leukocytes,Ua MODERATE (*)    WBC, UA >50 (*)    Bacteria, UA MANY (*)    All other components within normal limits  RESP PANEL BY RT-PCR (FLU A&B, COVID) ARPGX2  CULTURE, BLOOD (ROUTINE X 2)  CULTURE, BLOOD (ROUTINE X 2)  URINE CULTURE  EXPECTORATED SPUTUM ASSESSMENT W GRAM STAIN, RFLX TO RESP C  MRSA NEXT GEN BY PCR, NASAL  TECHNOLOGIST SMEAR REVIEW  MAGNESIUM  PROCALCITONIN  STREP PNEUMONIAE URINARY ANTIGEN  BASIC METABOLIC PANEL  CBC  LACTATE DEHYDROGENASE     EKG Sinus tachycardia, ventricular rate 121.  PR 120, QRS 125, QTc 565.  LVH with repolarization as well as left bundle branch  block.   RADIOLOGY Chest x-ray: Hazy opacity along the right heart border worrisome for pneumonia DG tib-fib right: Negative DG humerus left: Negative  DG pelvis: Negative   I also independently reviewed and agree with radiologist interpretations.   PROCEDURES:  Critical Care performed: Yes, see critical care procedure note(s)  .Critical Care  Performed by: Shaune Pollack, MD Authorized by: Shaune Pollack, MD   Critical care provider statement:    Critical care time (minutes):  30   Critical care was necessary to treat or prevent imminent or life-threatening deterioration of the following conditions:  Cardiac failure, circulatory failure, sepsis and respiratory failure   Critical care was time spent personally by me on the following activities:  Development of treatment plan with patient or surrogate, discussions with consultants, evaluation of patient's response to treatment, examination of patient, ordering and review of laboratory studies, ordering and review of radiographic studies, ordering and performing treatments and interventions, pulse oximetry, re-evaluation of patient's condition and review of old charts     MEDICATIONS ORDERED IN ED: Medications  0.9 %  sodium chloride infusion (has no administration in time range)  albuterol (PROVENTIL) (2.5 MG/3ML) 0.083% nebulizer solution 2.5 mg (has no administration in time range)  dextromethorphan-guaiFENesin (MUCINEX DM) 30-600 MG per 12 hr tablet 1 tablet (has no administration in time range)  acetaminophen (TYLENOL) tablet 650 mg (has no administration in time range)  acetaminophen (TYLENOL) suppository 650 mg (has no administration in time range)  hydrALAZINE (APRESOLINE) injection 5 mg (has no administration in time range)  diphenhydrAMINE (BENADRYL) injection 12.5 mg (has no administration in time range)  enoxaparin (LOVENOX) injection 30 mg (has no administration in time range)  metroNIDAZOLE (FLAGYL) IVPB 500 mg  (has no administration in time range)  aspirin EC tablet 81 mg (has no administration in time range)  HYDROcodone-acetaminophen (NORCO/VICODIN) 5-325 MG per tablet 1 tablet (has no administration in time range)  ALPRAZolam (XANAX) tablet 0.25 mg (0 mg Oral Hold 03/16/22 2001)  magnesium hydroxide (MILK OF MAGNESIA) suspension 30 mL (has no administration in time range)  polyethylene glycol (MIRALAX / GLYCOLAX) packet 17 g (has no administration in time range)  senna-docusate (Senokot-S) tablet 2 tablet (has no administration in time range)  vancomycin (VANCOREADY) IVPB 750 mg/150 mL (has no administration in time  range)  ceFEPIme (MAXIPIME) 2 g in sodium chloride 0.9 % 100 mL IVPB (has no administration in time range)  lactated ringers bolus 1,000 mL (0 mLs Intravenous Stopped 03/16/22 1642)  ceFEPIme (MAXIPIME) 2 g in sodium chloride 0.9 % 100 mL IVPB (0 g Intravenous Stopped 03/16/22 1450)  metroNIDAZOLE (FLAGYL) IVPB 500 mg (0 mg Intravenous Stopped 03/16/22 1520)  vancomycin (VANCOCIN) IVPB 1000 mg/200 mL premix (0 mg Intravenous Stopped 03/16/22 1642)  acetaminophen (TYLENOL) suppository 650 mg (650 mg Rectal Given 03/16/22 1420)  sodium chloride 0.9 % bolus 1,000 mL (1,000 mLs Intravenous New Bag/Given 03/16/22 1958)     IMPRESSION / MDM / ASSESSMENT AND PLAN / ED COURSE  I reviewed the triage vital signs and the nursing notes.                               The patient is on the cardiac monitor to evaluate for evidence of arrhythmia and/or significant heart rate changes.   Ddx:  Differential includes the following, with pertinent life- or limb-threatening emergencies considered:  Sepsis 2/2 UTI, PNA, intra-abd pathology, generalized weakness from dehydration, AKI, anemia, hyperthyroidism, medication effect  Patient's presentation is most consistent with acute presentation with potential threat to life or bodily function.  MDM:  88 yo F here with fever, tachycardia,  generalized weakness. Pt also hypoxic here with exam concerning for aspiration/basilar PNA. Code sepsis initiated with broad-spectrum ABX and IVF. Tylenol for fever. Pt is surprisingly awake, alert for her vitals and clinically improving with tx. Abdomen soft, NT, ND. UA shows +UTI. LA elevated c/w sepsis. CMP with mild AKI. CBC without leukocytosis  Will continue fluids, admit to Hospitalist. Wife updated and is in agreement.   MEDICATIONS GIVEN IN ED: Medications  0.9 %  sodium chloride infusion (has no administration in time range)  albuterol (PROVENTIL) (2.5 MG/3ML) 0.083% nebulizer solution 2.5 mg (has no administration in time range)  dextromethorphan-guaiFENesin (MUCINEX DM) 30-600 MG per 12 hr tablet 1 tablet (has no administration in time range)  acetaminophen (TYLENOL) tablet 650 mg (has no administration in time range)  acetaminophen (TYLENOL) suppository 650 mg (has no administration in time range)  hydrALAZINE (APRESOLINE) injection 5 mg (has no administration in time range)  diphenhydrAMINE (BENADRYL) injection 12.5 mg (has no administration in time range)  enoxaparin (LOVENOX) injection 30 mg (has no administration in time range)  metroNIDAZOLE (FLAGYL) IVPB 500 mg (has no administration in time range)  aspirin EC tablet 81 mg (has no administration in time range)  HYDROcodone-acetaminophen (NORCO/VICODIN) 5-325 MG per tablet 1 tablet (has no administration in time range)  ALPRAZolam (XANAX) tablet 0.25 mg (0 mg Oral Hold 03/16/22 2001)  magnesium hydroxide (MILK OF MAGNESIA) suspension 30 mL (has no administration in time range)  polyethylene glycol (MIRALAX / GLYCOLAX) packet 17 g (has no administration in time range)  senna-docusate (Senokot-S) tablet 2 tablet (has no administration in time range)  vancomycin (VANCOREADY) IVPB 750 mg/150 mL (has no administration in time range)  ceFEPIme (MAXIPIME) 2 g in sodium chloride 0.9 % 100 mL IVPB (has no administration in time range)   lactated ringers bolus 1,000 mL (0 mLs Intravenous Stopped 03/16/22 1642)  ceFEPIme (MAXIPIME) 2 g in sodium chloride 0.9 % 100 mL IVPB (0 g Intravenous Stopped 03/16/22 1450)  metroNIDAZOLE (FLAGYL) IVPB 500 mg (0 mg Intravenous Stopped 03/16/22 1520)  vancomycin (VANCOCIN) IVPB 1000 mg/200 mL premix (0 mg Intravenous Stopped 03/16/22 1642)  acetaminophen (TYLENOL) suppository 650 mg (650 mg Rectal Given 03/16/22 1420)  sodium chloride 0.9 % bolus 1,000 mL (1,000 mLs Intravenous New Bag/Given 03/16/22 1958)     Consults:  Hospitalist   EMR reviewed       FINAL CLINICAL IMPRESSION(S) / ED DIAGNOSES   Final diagnoses:  Sepsis secondary to UTI (HCC)  Aspiration pneumonia of right lower lobe, unspecified aspiration pneumonia type (HCC)  Acute encephalopathy     Rx / DC Orders   ED Discharge Orders     None        Note:  This document was prepared using Dragon voice recognition software and may include unintentional dictation errors.   Shaune Pollack, MD 03/16/22 2006

## 2022-03-16 NOTE — Consult Note (Signed)
Pharmacy Antibiotic Note  Faith Vasquez is a 88 y.o. female admitted on 03/16/2022 with pneumonia.  Pharmacy has been consulted for cefepime and vancomycin dosing.  10/22 Patient received Vancomycin 1g IV x1 in ED  Plan: Give Cefepime 2g IV every 24 hours Give Vancomycin 750mg  IV every 48 hours Goal AUC 400-550  Est AUC: 514.4   Est Cmax: 36.6 Est Cmin: 11.5 Calculated with SCr 1.21 Continue to monitor and dose adjust antibiotics according to renal function and indication    Height: 5\' 5"  (165.1 cm) Weight: 59 kg (130 lb) IBW/kg (Calculated) : 57  Temp (24hrs), Avg:100.9 F (38.3 C), Min:98.2 F (36.8 C), Max:103.5 F (39.7 C)  Recent Labs  Lab 03/16/22 1355 03/16/22 1356 03/16/22 1556  WBC 7.0  --   --   CREATININE 1.21*  --   --   LATICACIDVEN  --  2.9* 2.8*    Estimated Creatinine Clearance: 24.5 mL/min (A) (by C-G formula based on SCr of 1.21 mg/dL (H)).    Allergies  Allergen Reactions   Codeine    Iodine    Macrobid [Nitrofurantoin] Other (See Comments)    Unknown reaction.   Ciprofloxacin Rash    Other reaction(s): whelps    Antimicrobials this admission: 10/22 Cefepime >>  10/22 Vancomycin >>    Microbiology results: 10/22 BCx: sent 10/22 UCx: sent  10/22 Sputum: sent  10/22 MRSA PCR: ordered  Thank you for allowing pharmacy to be a part of this patient's care.  Darrick Penna 03/16/2022 5:26 PM

## 2022-03-16 NOTE — Sepsis Progress Note (Signed)
Sepsis protocol is being followed by [facility]. 

## 2022-03-16 NOTE — H&P (Signed)
History and Physical    Faith Vasquez DEY:[id number] DOB: 08/24/1922 DOA: 03/16/2022  Referring MD/NP/PA:   PCP: Enid Baas, MD   Patient coming from:  The patient is coming from SNF   Chief Complaint: Fever, chills, weakness, fall, worsening mental status  HPI: Faith Vasquez is a 88 y.o. female with medical history significant of hypertension, GERD, anxiety, C. difficile colitis, advanced dementia, CKD-3a,  who presents with fever, chills, weakness, fall, worsening mental status.  Per her daughter-in-law at the bedside, patient has dementia. At her normal baseline, patient recognizes family members, oriented to place, most of the time patient is confused with time.  In the past several days, patient is more confused.  She recognizes family member, but disoriented to place and time.  She moves all extremities.  No facial droop or slurred speech.  Patient has generalized weakness.  She was noted to have fever, chills, shaking.  She has dry cough, shortness breath, does not seem to have chest pain.  Patient seems to have choking episodes when eating food.  Patient fell 3 times since last night.  She has bruises to her left knee, right elbow, right leg.  She complains of right leg pain.  Not sure if patient injured her head or neck.  Patient has has dysuria per her daughter-in-law.  Data reviewed independently and ED Course: pt was found to have WBC 7.0, lactic acid 2.9, INR 1.2, PTT 31, positive urinalysis for UTI (hazy appearance, moderate amount of leukocyte, many bacteria, WBC> 50), slightly worsening renal function.  Temperature 103.5, blood pressure 151/72, heart rate 125, RR 22, oxygen saturation 89% on room air, which improved to 95% on 2 L oxygen. X-ray of left humerus, pelvis, right tibia/fibula negative for bony fracture. Patient is admitted to PCU as inpatient.  Chest x-ray: Increased hazy opacity along the right heart border is worrisome for infection. Apparent  interval increase in rightward deviation of the trachea may be due to positioning. If there is clinical concern for mediastinal pathology, further evaluation with CT chest is recommended.  CT-head and neck: 1. No acute intracranial abnormality. Sequela of chronic microvascular ischemic change. 2. No acute cervical spine fracture. 3. Apparent polypoid soft tissue mass along the posterior aspect of the trachea. Recommend direct visualization.    EKG: I have personally reviewed.  Sinus rhythm, QTc 566, LAE, LAD, poor R wave progression, ST depression in lateral leads.  Review of Systems: Could not reviewed accurately due to dementia and altered mental status.   Allergy:  Allergies  Allergen Reactions   Codeine    Iodine    Macrobid [Nitrofurantoin] Other (See Comments)    Unknown reaction.   Ciprofloxacin Rash    Other reaction(s): whelps    Past Medical History:  Diagnosis Date   Diverticulosis    History of Clostridium difficile infection    uncertain dates, possibly in 2017 or 2018    Past Surgical History:  Procedure Laterality Date   ABDOMINAL HYSTERECTOMY     ANTERIOR APPROACH HEMI HIP ARTHROPLASTY Right 12/21/2021   Procedure: ANTERIOR APPROACH HEMI HIP ARTHROPLASTY;  Surgeon: Kennedy Bucker, MD;  Location: [facility] ORS;  Service: Orthopedics;  Laterality: Right;   CHOLECYSTECTOMY     HIP ARTHROPLASTY Left 03/25/2020   Procedure: ARTHROPLASTY BIPOLAR HIP (HEMIARTHROPLASTY);  Surgeon: Kennedy Bucker, MD;  Location: [facility] ORS;  Service: Orthopedics;  Laterality: Left;    Social History:  reports that she has never smoked. She has never used smokeless tobacco. She reports that she does  not drink alcohol and does not use drugs.  Family History: History reviewed. No pertinent family history.  Could not reviewed due to dementia and altered mental status.  Prior to Admission medications   Medication Sig Start Date End Date Taking? Authorizing Provider  acetaminophen (TYLENOL)  325 MG tablet Take 2 tablets by mouth in the morning, at noon, and at bedtime. 12/03/21  Yes [provider]  ALPRAZolam Prudy Feeler) 0.5 MG tablet Take 0.25 mg by mouth at bedtime. At 5pm 02/20/22  Yes [provider]  aspirin 81 MG EC tablet Chew 81 mg by mouth daily.   Yes [provider]  polyethylene glycol (MIRALAX / GLYCOLAX) 17 g packet Take 17 g by mouth 2 (two) times daily. 03/27/20  Yes Darlin Priestly, MD  senna-docusate (SENOKOT-S) 8.6-50 MG tablet Take 2 tablets by mouth 2 (two) times daily.   Yes [provider]  ALPRAZolam (XANAX) 0.25 MG tablet Take 0.25 mg by mouth 2 (two) times daily as needed for anxiety. Patient not taking: Reported on 03/16/2022    [provider]  amLODipine (NORVASC) 5 MG tablet Take 5 mg by mouth daily. Patient not taking: Reported on 03/16/2022 11/26/21   [provider]  Cholecalciferol (VITAMIN D3) 50 MCG (2000 UT) TABS Take 1 capsule by mouth daily. Wednesday 03/22/20   [provider]  enoxaparin (LOVENOX) 30 MG/0.3ML injection Inject 0.3 mLs (30 mg total) into the skin daily for 14 days. 12/24/21 01/07/22  Arnetha Courser, MD  feeding supplement (ENSURE ENLIVE / ENSURE PLUS) LIQD Take 237 mLs by mouth 2 (two) times daily between meals. 03/27/20   Darlin Priestly, MD  guaifenesin (HUMIBID E) 400 MG TABS tablet Take 400 mg by mouth every 4 (four) hours as needed.    [provider]  HYDROcodone-acetaminophen (NORCO/VICODIN) 5-325 MG tablet Take 1 tablet by mouth every 4 (four) hours as needed for moderate pain or severe pain. 12/24/21   Arnetha Courser, MD  magnesium hydroxide (MILK OF MAGNESIA) 400 MG/5ML suspension Take 30 mLs by mouth daily as needed for mild constipation or moderate constipation. 12/24/21   Arnetha Courser, MD  methocarbamol (ROBAXIN) 500 MG tablet Take 1 tablet (500 mg total) by mouth every 6 (six) hours as needed for muscle spasms. Patient not taking: Reported on 03/16/2022 12/24/21   Arnetha Courser,  MD  ondansetron (ZOFRAN) 4 MG tablet Take 4 mg by mouth every 6 (six) hours as needed for nausea or vomiting.    [provider]  risperiDONE (RISPERDAL) 0.25 MG tablet Take 1 tablet by mouth 2 (two) times daily. 12/03/21 01/02/22  [provider]    Physical Exam: Vitals:   03/16/22 1342 03/16/22 1400 03/16/22 1430  BP: (!) 151/72 133/74 (!) 145/74  Pulse: (!) 125 (!) 120 (!) 111  Resp: (!) 22 20 (!) 22  Temp: (!) 103.5 F (39.7 C)  98.2 F (36.8 C)  TempSrc: Rectal  Axillary  SpO2: (!) 89% 94% 95%  Weight: 59 kg    Height:  (1.651 m)     General: Not in acute distress HEENT:       Eyes: PERRL, EOMI, no scleral icterus.       ENT: No discharge from the ears and nose       Neck: No JVD, no bruit, no mass felt. Heme: No neck lymph node enlargement. Cardiac: S1/S2, RRR, No murmurs, No gallops or rubs. Respiratory: Has coarse breathing sound bilaterally. GI: Soft, nondistended, nontender, no organomegaly, BS present. GU:  No hematuria Ext: No pitting leg edema bilaterally. 1+DP/PT pulse bilaterally. Musculoskeletal: No joint deformities, No joint redness or warmth, no limitation of ROM in spin. Skin: Has multiple bruises to left knee, right elbow, right leg. Neuro: Confused, knows her own name, not orientated to place and time. Cranial nerves II-XII grossly intact, moves all extremities. Psych: Patient is not psychotic, no suicidal or hemocidal ideation.  Labs on Admission: I have personally reviewed following labs and imaging studies  CBC: Recent Labs  Lab 03/16/22 1355  WBC 7.0  NEUTROABS 6.1  HGB 12.7  HCT 40.9  MCV 87.4  PLT 120*   Basic Metabolic Panel: Recent Labs  Lab 03/16/22 1355  NA 140  K 4.0  CL 109  CO2 21*  GLUCOSE 130*  BUN 34*  CREATININE 1.21*  CALCIUM 9.5  MG 2.0   GFR: Estimated Creatinine Clearance: 24.5 mL/min (A) (by C-G formula based on SCr of 1.21 mg/dL (H)). Liver Function Tests: Recent Labs  Lab  03/16/22 1355  AST 48*  ALT 18  ALKPHOS 75  BILITOT 1.5*  PROT 6.4*  ALBUMIN 3.4*   No results for input(s): "LIPASE", "AMYLASE" in the last 168 hours. No results for input(s): "AMMONIA" in the last 168 hours. Coagulation Profile: Recent Labs  Lab 03/16/22 1355  INR 1.2   Cardiac Enzymes: No results for input(s): "CKTOTAL", "CKMB", "CKMBINDEX", "TROPONINI" in the last 168 hours. BNP (last 3 results) No results for input(s): "PROBNP" in the last 8760 hours. HbA1C: No results for input(s): "HGBA1C" in the last 72 hours. CBG: No results for input(s): "GLUCAP" in the last 168 hours. Lipid Profile: No results for input(s): "CHOL", "HDL", "LDLCALC", "TRIG", "CHOLHDL", "LDLDIRECT" in the last 72 hours. Thyroid Function Tests: No results for input(s): "TSH", "T4TOTAL", "FREET4", "T3FREE", "THYROIDAB" in the last 72 hours. Anemia Panel: No results for input(s): "VITAMINB12", "FOLATE", "FERRITIN", "TIBC", "IRON", "RETICCTPCT" in the last 72 hours. Urine analysis:    Component Value Date/Time   COLORURINE YELLOW (A) 03/16/2022 1500   APPEARANCEUR HAZY (A) 03/16/2022 1500   LABSPEC 1.014 03/16/2022 1500   PHURINE 5.0 03/16/2022 1500   GLUCOSEU NEGATIVE 03/16/2022 1500   HGBUR MODERATE (A) 03/16/2022 1500   BILIRUBINUR NEGATIVE 03/16/2022 1500   KETONESUR NEGATIVE 03/16/2022 1500   PROTEINUR 100 (A) 03/16/2022 1500   NITRITE POSITIVE (A) 03/16/2022 1500   LEUKOCYTESUR MODERATE (A) 03/16/2022 1500   Sepsis Labs: (procalcitonin:4,lacticidven:4) ) Recent Results (from the past 240 hour(s))  Resp Panel by RT-PCR (Flu A&B, Covid) Anterior Nasal Swab     Status: None   Collection Time: 03/16/22  3:00 PM   Specimen: Anterior Nasal Swab  Result Value Ref Range Status   SARS Coronavirus 2 by RT PCR NEGATIVE NEGATIVE Final    Comment: (NOTE) SARS-CoV-2 target nucleic acids are NOT DETECTED.  The SARS-CoV-2 RNA is generally detectable in upper respiratory specimens during  the acute phase of infection. The lowest concentration of SARS-CoV-2 viral copies this assay can detect is 138 copies/mL. A negative result does not preclude SARS-Cov-2 infection and should not be used as the sole basis for treatment or other patient management decisions. A negative result may occur with  improper specimen collection/handling, submission of specimen other than nasopharyngeal swab, presence of viral mutation(s) within the areas targeted by this assay, and inadequate number of viral copies(<138 copies/mL). A negative result must be combined with clinical observations, patient history, and epidemiological information. The expected result is Negative.  Fact Sheet for Patients:  BloggerCourse.com  Fact Sheet for Healthcare Providers:  IncredibleEmployment.be  This test is no t yet approved or cleared by the [place] FDA and  has been authorized for detection and/or diagnosis of SARS-CoV-2 by FDA under an Emergency Use Authorization (EUA). This EUA will remain  in effect (meaning this test can be used) for the duration of the COVID-19 declaration under Section 564(b)(1) of the Act, 21 U.S.C.section 360bbb-3(b)(1), unless the authorization is terminated  or revoked sooner.       Influenza A by PCR NEGATIVE NEGATIVE Final   Influenza B by PCR NEGATIVE NEGATIVE Final    Comment: (NOTE) The Xpert Xpress SARS-CoV-2/FLU/RSV plus assay is intended as an aid in the diagnosis of influenza from Nasopharyngeal swab specimens and should not be used as a sole basis for treatment. Nasal washings and aspirates are unacceptable for Xpert Xpress SARS-CoV-2/FLU/RSV testing.  Fact Sheet for Patients: EntrepreneurPulse.com.au  Fact Sheet for Healthcare Providers: IncredibleEmployment.be  This test is not yet approved or cleared by the [place] FDA and has been authorized for detection and/or  diagnosis of SARS-CoV-2 by FDA under an Emergency Use Authorization (EUA). This EUA will remain in effect (meaning this test can be used) for the duration of the COVID-19 declaration under Section 564(b)(1) of the Act, 21 U.S.C. section 360bbb-3(b)(1), unless the authorization is terminated or revoked.  Performed at [facility], [address] [place], [place] [address]      Radiological Exams on Admission: CT HEAD WO CONTRAST (5MM)  Result Date: 03/16/2022 CLINICAL DATA:  Weakness EXAM: CT HEAD WITHOUT CONTRAST CT CERVICAL SPINE WITHOUT CONTRAST TECHNIQUE: Multidetector CT imaging of the head and cervical spine was performed following the standard protocol without intravenous contrast. Multiplanar CT image reconstructions of the cervical spine were also generated. RADIATION DOSE REDUCTION: This exam was performed according to the departmental dose-optimization program which includes automated exposure control, adjustment of the mA and/or kV according to patient size and/or use of iterative reconstruction technique. COMPARISON:  None Available. FINDINGS: CT HEAD FINDINGS Brain: No evidence of acute infarction, hemorrhage, hydrocephalus, extra-axial collection or mass lesion/mass effect. Sequela of chronic microvascular ischemic change. Redemonstrated chronic left thalamic infarct. Vascular: No hyperdense vessel or unexpected calcification. Skull: Normal. Negative for fracture or focal lesion. Sinuses/Orbits: Bilateral lens replacement. Other: None. CT CERVICAL SPINE FINDINGS Alignment: Straightening of the normal cervical lordosis. There are degenerative changes at C1-C2 with subchondral cystic change and a degenerative pannus. There is grade 1 anterolisthesis of C2 on C3 and C3 on C4. Skull base and vertebrae: No acute fracture. No primary bone lesion or focal pathologic process. Soft tissues and spinal canal: No prevertebral fluid or swelling. No visible canal hematoma.There is an  apparent polypoid soft tissue mass either within or possibly invading the airway (series 4, iamge 68). Disc levels: There is multilevel degenerative disc with severe disc space loss in the midcervical spine. Upper chest: Negative. Other: None IMPRESSION: 1. No acute intracranial abnormality. Sequela of chronic microvascular ischemic change. 2. No acute cervical spine fracture. 3. Apparent polypoid soft tissue mass along the posterior aspect of the trachea. Recommend direct visualization. Electronically Signed   By: Marin Roberts M.D.   On: 03/16/2022 17:03   CT Cervical Spine Wo Contrast  Result Date: 03/16/2022 CLINICAL DATA:  Weakness EXAM: CT HEAD WITHOUT CONTRAST CT CERVICAL SPINE WITHOUT CONTRAST TECHNIQUE: Multidetector CT imaging of the head and cervical spine was performed following the standard protocol without intravenous contrast. Multiplanar CT image reconstructions of the cervical spine  were also generated. RADIATION DOSE REDUCTION: This exam was performed according to the departmental dose-optimization program which includes automated exposure control, adjustment of the mA and/or kV according to patient size and/or use of iterative reconstruction technique. COMPARISON:  None Available. FINDINGS: CT HEAD FINDINGS Brain: No evidence of acute infarction, hemorrhage, hydrocephalus, extra-axial collection or mass lesion/mass effect. Sequela of chronic microvascular ischemic change. Redemonstrated chronic left thalamic infarct. Vascular: No hyperdense vessel or unexpected calcification. Skull: Normal. Negative for fracture or focal lesion. Sinuses/Orbits: Bilateral lens replacement. Other: None. CT CERVICAL SPINE FINDINGS Alignment: Straightening of the normal cervical lordosis. There are degenerative changes at C1-C2 with subchondral cystic change and a degenerative pannus. There is grade 1 anterolisthesis of C2 on C3 and C3 on C4. Skull base and vertebrae: No acute fracture. No primary bone lesion or  focal pathologic process. Soft tissues and spinal canal: No prevertebral fluid or swelling. No visible canal hematoma.There is an apparent polypoid soft tissue mass either within or possibly invading the airway (series 4, iamge 68). Disc levels: There is multilevel degenerative disc with severe disc space loss in the midcervical spine. Upper chest: Negative. Other: None IMPRESSION: 1. No acute intracranial abnormality. Sequela of chronic microvascular ischemic change. 2. No acute cervical spine fracture. 3. Apparent polypoid soft tissue mass along the posterior aspect of the trachea. Recommend direct visualization. Electronically Signed   By: Lorenza Cambridge M.D.   On: 03/16/2022 17:03   DG Tibia/Fibula Right  Result Date: 03/16/2022 CLINICAL DATA:  Trauma, fall EXAM: RIGHT TIBIA AND FIBULA - 2 VIEW COMPARISON:  None Available. FINDINGS: No fracture or dislocation is seen. Degenerative changes are noted in right knee with chondrocalcinosis. Plantar spur is seen in calcaneus. IMPRESSION: No fracture or dislocation is seen in right tibia and fibula. Degenerative changes are noted in right knee. Plantar spur is seen in right calcaneus. Electronically Signed   By: Ernie Avena M.D.   On: 03/16/2022 15:26   DG Humerus Left  Result Date: 03/16/2022 CLINICAL DATA:  Trauma, fall EXAM: LEFT HUMERUS - 2+ VIEW COMPARISON:  None Available. FINDINGS: There is no evidence of fracture or other focal bone lesions. Soft tissues are unremarkable. IMPRESSION: No fracture or dislocation is seen in left humerus. Electronically Signed   By: Ernie Avena M.D.   On: 03/16/2022 15:25   DG Pelvis 1-2 Views  Result Date: 03/16/2022 CLINICAL DATA:  Trauma, multiple falls EXAM: PELVIS - 1-2 VIEW COMPARISON:  None Available. FINDINGS: There is previous arthroplasty in both hips. No recent fracture or dislocation is seen. Severe degenerative changes are noted in visualized lower lumbar spine. Surgical clips are seen in  right lower quadrant. IMPRESSION: Previous arthroplasty in both hips. No recent fracture or dislocation is seen. Lumbar spondylosis. Electronically Signed   By: Ernie Avena M.D.   On: 03/16/2022 15:23   DG Chest Port 1 View  Result Date: 03/16/2022 CLINICAL DATA:  Possible sepsis EXAM: PORTABLE CHEST 1 VIEW COMPARISON:  Chest radiograph 12/20/21 FINDINGS: Pleural effusion. No pneumothorax. Low lung volumes. There is increased hazy opacity along the right heart border an apparent interval increase in rightward deviation of the trachea. Calcifications along the aortic arch appear unchanged. Visualized upper abdomen is unremarkable. IMPRESSION: Increased hazy opacity along the right heart border is worrisome for infection. Apparent interval increase in rightward deviation of the trachea may be due to positioning. If there is clinical concern for mediastinal pathology, further evaluation with CT chest is recommended. Electronically Signed   By: Sandria Senter  Celine Mansesai M.D.   On: 03/16/2022 14:12      Assessment/Plan Principal Problem:   Severe sepsis (HCC) Active Problems:   HCAP (healthcare-associated pneumonia)   Aspiration pneumonia (HCC)   UTI (urinary tract infection)   Advanced dementia (HCC)   Acute metabolic encephalopathy   Essential hypertension   Chronic kidney disease, stage 3a (HCC)   Fall   Thrombocytopenia (HCC)   Anxiety   Prolonged QT interval   Assessment and Plan:  Severe sepsis due to UTI and HCAP vs. aspiration pneumonia [facility](HCC): Patient meets criteria for severe sepsis with fever 103.5, tachycardia with heart rate of 125, RR 22.  Lactic acid 2.9.  -Admitted to PCU as inpatient -Antibiotics: Vancomycin, cefepime and Flagyl started in ED, will continue. -Blood culture, urine culture -IV fluid: 1 L normal saline, 1 L LR was given in ED, will continue 75/h of normal saline -->will give more IV fluid bolus if lactic acid is persistently elevated -Trend lactic acid  level -Check procalcitonin level  UTI: -See above  HCAP versus aspiration pneumonia: Patient has new 2 L oxygen requirement. -Antibiotics as above -SLP - Mucinex for cough  - Bronchodilators - Urine S. pneumococcal antigen - Follow up blood culture x2, sputum culture  Advanced dementia (HCC) and acute metabolic encephalopathy:  -fall precaution -Frequent neurochecks  Essential hypertension - IV Hydralazine as needed -Hold amlodipine since patient mid to high risk of developing hypotension due to severe sepsis  Chronic kidney disease, stage 3a (HCC): Renal function slightly worsened than baseline.  Baseline creatinine 0.96 on 12/22/2021.  Her creatinine is 1.21, BUN 34, GFR 41, may be due to UTI. -IV fluid as above -Follow-up with BMP  Fall: Initial imaging negative for acute injury. -Fall precaution -Patient will need PT/OT in SNF -Follow-up x-ray of right elbow and left knee  Thrombocytopenia (HCC): Platelet 128.  Etiology is not clear.  May be due to ongoing infection and sepsis. -Check LDH -Peripheral smear  Anxiety -Continue home as needed Xanax  Prolonged QT interval: QTc 566 -Hold risperidone -Avoid using QT prolonging medications, such as Zofran. -As needed Benadryl for nausea vomiting  Abnormal findings on imaging: Chest x-ray showed interval increase in rightward deviation of the trachea. CT of C spin showed apparent polypoid soft tissue mass along the posterior aspect of the trachea. Recommend direct visualization. -please consult pulmonology on Monday.     DVT ppx: SQ Lovenox  Code Status: DNR per her daugher-in law  Family Communication:  Yes, patient's daughter-in law at bed side.   Disposition Plan:  Anticipate discharge back to previous environment  Consults called:  none  Admission status and Level of care: Progressive:    as inpt      Dispo: The patient is from: SNF              Anticipated d/c is to: SNF              Anticipated d/c  date is: 2 days              Patient currently is not medically stable to d/c.    Severity of Illness:  The appropriate patient status for this patient is INPATIENT. Inpatient status is judged to be reasonable and necessary in order to provide the required intensity of service to ensure the patient's safety. The patient's presenting symptoms, physical exam findings, and initial radiographic and laboratory data in the context of their chronic comorbidities is felt to place them at high risk for further  clinical deterioration. Furthermore, it is not anticipated that the patient will be medically stable for discharge from the hospital within 2 midnights of admission.   * I certify that at the point of admission it is my clinical judgment that the patient will require inpatient hospital care spanning beyond 2 midnights from the point of admission due to high intensity of service, high risk for further deterioration and high frequency of surveillance required.*       Date of Service 03/16/2022    Lorretta Harp [facility]   If 7PM-7AM, please contact night-coverage [email or web address] 03/16/2022, 5:30 PM

## 2022-03-16 NOTE — Code Documentation (Signed)
CODE SEPSIS - PHARMACY COMMUNICATION  **Broad Spectrum Antibiotics should be administered within 1 hour of Sepsis diagnosis**  Time Code Sepsis Called/Page Received: 1791  Antibiotics Ordered: cefepime, flagyl, vancomycin  Time of 1st antibiotic administration: [place] Akshaj Besancon ,PharmD Clinical Pharmacist  03/16/2022  2:25 PM

## 2022-03-16 NOTE — Consult Note (Signed)
PHARMACY -  BRIEF ANTIBIOTIC NOTE   Pharmacy has received consult(s) for vancomycin and cefepime from an ED provider.  The patient's profile has been reviewed for ht/wt/allergies/indication/available labs.    One time order(s) placed for Vancomycin 1g IV x1. Cefepime 2g IV x1  Further antibiotics/pharmacy consults should be ordered by admitting physician if indicated.                       Thank you, Darrick Penna 03/16/2022  2:26 PM

## 2022-03-16 NOTE — ED Triage Notes (Signed)
"  Patient has history of dementia, but has been more altered than usual. Family came to take her to the doctor to check for UTI, but she was weak so they called EMS" per EMS CBG 177. 248ml NS given. Temp 101.1 axillary / per EMS  "Facility called and said that she had fallen multiple times last night and this morning. When we got there she was too weak to stand and was shaking so we called EMS" per son & daughter in law

## 2022-03-17 ENCOUNTER — Inpatient Hospital Stay: Payer: Medicare Other

## 2022-03-17 DIAGNOSIS — R9389 Abnormal findings on diagnostic imaging of other specified body structures: Secondary | ICD-10-CM | POA: Diagnosis present

## 2022-03-17 DIAGNOSIS — N390 Urinary tract infection, site not specified: Principal | ICD-10-CM | POA: Diagnosis present

## 2022-03-17 DIAGNOSIS — R6520 Severe sepsis without septic shock: Secondary | ICD-10-CM | POA: Diagnosis not present

## 2022-03-17 DIAGNOSIS — A419 Sepsis, unspecified organism: Secondary | ICD-10-CM | POA: Diagnosis not present

## 2022-03-17 LAB — BASIC METABOLIC PANEL
Anion gap: 7 (ref 5–15)
BUN: 30 mg/dL — ABNORMAL HIGH (ref 8–23)
CO2: 21 mmol/L — ABNORMAL LOW (ref 22–32)
Calcium: 8.4 mg/dL — ABNORMAL LOW (ref 8.9–10.3)
Chloride: 113 mmol/L — ABNORMAL HIGH (ref 98–111)
Creatinine, Ser: 1.1 mg/dL — ABNORMAL HIGH (ref 0.44–1.00)
GFR, Estimated: 46 mL/min — ABNORMAL LOW (ref >60)
Glucose, Bld: 79 mg/dL (ref 70–99)
Potassium: 3.6 mmol/L (ref 3.5–5.1)
Sodium: 141 mmol/L (ref 135–145)

## 2022-03-17 LAB — BLOOD CULTURE ID PANEL (REFLEXED) - BCID2
A.calcoaceticus-baumannii: NOT DETECTED
A.calcoaceticus-baumannii: NOT DETECTED
Bacteroides fragilis: NOT DETECTED
Bacteroides fragilis: NOT DETECTED
CTX-M ESBL: NOT DETECTED
Candida albicans: NOT DETECTED
Candida albicans: NOT DETECTED
Candida auris: NOT DETECTED
Candida auris: NOT DETECTED
Candida glabrata: NOT DETECTED
Candida glabrata: NOT DETECTED
Candida krusei: NOT DETECTED
Candida krusei: NOT DETECTED
Candida parapsilosis: NOT DETECTED
Candida parapsilosis: NOT DETECTED
Candida tropicalis: NOT DETECTED
Candida tropicalis: NOT DETECTED
Carbapenem resist OXA 48 LIKE: NOT DETECTED
Carbapenem resistance IMP: NOT DETECTED
Carbapenem resistance KPC: NOT DETECTED
Carbapenem resistance NDM: NOT DETECTED
Carbapenem resistance VIM: NOT DETECTED
Cryptococcus neoformans/gattii: NOT DETECTED
Cryptococcus neoformans/gattii: NOT DETECTED
Enterobacter cloacae complex: NOT DETECTED
Enterobacter cloacae complex: NOT DETECTED
Enterobacterales: DETECTED — AB
Enterobacterales: NOT DETECTED
Enterococcus Faecium: NOT DETECTED
Enterococcus Faecium: NOT DETECTED
Enterococcus faecalis: NOT DETECTED
Enterococcus faecalis: NOT DETECTED
Escherichia coli: DETECTED — AB
Escherichia coli: NOT DETECTED
Haemophilus influenzae: NOT DETECTED
Haemophilus influenzae: NOT DETECTED
Klebsiella aerogenes: NOT DETECTED
Klebsiella aerogenes: NOT DETECTED
Klebsiella oxytoca: NOT DETECTED
Klebsiella oxytoca: NOT DETECTED
Klebsiella pneumoniae: NOT DETECTED
Klebsiella pneumoniae: NOT DETECTED
Listeria monocytogenes: NOT DETECTED
Listeria monocytogenes: NOT DETECTED
Neisseria meningitidis: NOT DETECTED
Neisseria meningitidis: NOT DETECTED
Proteus species: NOT DETECTED
Proteus species: NOT DETECTED
Pseudomonas aeruginosa: NOT DETECTED
Pseudomonas aeruginosa: NOT DETECTED
Salmonella species: NOT DETECTED
Salmonella species: NOT DETECTED
Serratia marcescens: NOT DETECTED
Serratia marcescens: NOT DETECTED
Staphylococcus aureus (BCID): NOT DETECTED
Staphylococcus aureus (BCID): NOT DETECTED
Staphylococcus epidermidis: NOT DETECTED
Staphylococcus epidermidis: NOT DETECTED
Staphylococcus lugdunensis: NOT DETECTED
Staphylococcus lugdunensis: NOT DETECTED
Staphylococcus species: NOT DETECTED
Staphylococcus species: DETECTED — AB
Stenotrophomonas maltophilia: NOT DETECTED
Stenotrophomonas maltophilia: NOT DETECTED
Streptococcus agalactiae: NOT DETECTED
Streptococcus agalactiae: NOT DETECTED
Streptococcus pneumoniae: NOT DETECTED
Streptococcus pneumoniae: NOT DETECTED
Streptococcus pyogenes: NOT DETECTED
Streptococcus pyogenes: NOT DETECTED
Streptococcus species: NOT DETECTED
Streptococcus species: NOT DETECTED

## 2022-03-17 LAB — CBC
HCT: 36.6 % (ref 36.0–46.0)
Hemoglobin: 11 g/dL — ABNORMAL LOW (ref 12.0–15.0)
MCH: 27.2 pg (ref 26.0–34.0)
MCHC: 30.1 g/dL (ref 30.0–36.0)
MCV: 90.4 fL (ref 80.0–100.0)
Platelets: 119 10*3/uL — ABNORMAL LOW (ref 150–400)
RBC: 4.05 MIL/uL (ref 3.87–5.11)
RDW: 16.2 % — ABNORMAL HIGH (ref 11.5–15.5)
WBC: 6.7 10*3/uL (ref 4.0–10.5)
nRBC: 0 % (ref 0.0–0.2)

## 2022-03-17 LAB — LACTATE DEHYDROGENASE: LDH: 202 U/L — ABNORMAL HIGH (ref 98–192)

## 2022-03-17 LAB — LACTIC ACID, PLASMA: Lactic Acid, Venous: 1.4 mmol/L (ref 0.5–1.9)

## 2022-03-17 LAB — CBG MONITORING, ED: Glucose-Capillary: 103 mg/dL — ABNORMAL HIGH (ref 70–99)

## 2022-03-17 LAB — STREP PNEUMONIAE URINARY ANTIGEN: Strep Pneumo Urinary Antigen: NEGATIVE

## 2022-03-17 MED ORDER — QUETIAPINE FUMARATE 25 MG PO TABS
25.0000 mg | ORAL_TABLET | Freq: Every day | ORAL | Status: DC
  Administered 2022-03-17: 25 mg via ORAL
  Filled 2022-03-17 (×2): qty 1

## 2022-03-17 MED ORDER — SODIUM CHLORIDE 0.9 % IV SOLN
2.0000 g | INTRAVENOUS | Status: DC
  Administered 2022-03-17 – 2022-03-19 (×3): 2 g via INTRAVENOUS
  Filled 2022-03-17 (×3): qty 20

## 2022-03-17 MED ORDER — VANCOMYCIN HCL IN DEXTROSE 1-5 GM/200ML-% IV SOLN
1000.0000 mg | INTRAVENOUS | Status: DC
  Administered 2022-03-18: 1000 mg via INTRAVENOUS
  Filled 2022-03-17: qty 200

## 2022-03-17 MED ORDER — AMLODIPINE BESYLATE 5 MG PO TABS
5.0000 mg | ORAL_TABLET | Freq: Every day | ORAL | Status: DC
  Administered 2022-03-17 – 2022-03-19 (×3): 5 mg via ORAL
  Filled 2022-03-17 (×3): qty 1

## 2022-03-17 NOTE — Hospital Course (Addendum)
Taken from H&P.  Faith Vasquez is a 88 y.o. female with medical history significant of hypertension, GERD, anxiety, C. difficile colitis, advanced dementia, CKD-3a,  who presents with fever, chills, weakness, fall, worsening mental status. Patient has underlying advanced dementia and lives in a memory care unit.  When family saw her yesterday they immediately called the ambulance.  Apparently had multiple choking events while eating food.  Patient also fell 3 times last night and had bruising to her left knee, right elbow and right leg.  Daughter-in-law was concerned about some dysuria but patient was unable to explain any symptoms due to advanced dementia.  Data reviewed independently and ED Course: pt was found to have WBC 7.0, lactic acid 2.9, INR 1.2, PTT 31, positive urinalysis for UTI (hazy appearance, moderate amount of leukocyte, many bacteria, WBC> 50), slightly worsening renal function.  Temperature 103.5, blood pressure 151/72, heart rate 125, RR 22, oxygen saturation 89% on room air, which improved to 95% on 2 L oxygen. X-ray of left humerus, pelvis, right tibia/fibula negative for bony fracture.  Patient was started on broad-spectrum antibiotics with vancomycin, cefepime and Flagyl for concern of sepsis.  Chest x-ray: Increased hazy opacity along the right heart border is worrisome for infection. Apparent interval increase in rightward deviation of the trachea may be due to positioning. If there is clinical concern for mediastinal pathology, further evaluation with CT chest is recommended.   CT-head and neck: 1. No acute intracranial abnormality. Sequela of chronic microvascular ischemic change. 2. No acute cervical spine fracture. 3. Apparent polypoid soft tissue mass along the posterior aspect of the trachea. Recommend direct visualization.     EKG: I also personally reviewed.  Sinus rhythm, QTc 566, LAE, LAD, poor R wave progression, ST depression in lateral leads.  10/23:  Preliminary blood cultures with E. Coli.  Antibiotics switched to ceftriaxone and Flagyl.  Procalcitonin at 3.65 1/4 blood culture bottles with gram-positive cocci, Staphylococcus species.  Vancomycin was added back and we will repeat blood cultures. Pulmonary was also consulted for the concerning soft tissue mass behind the trachea, pulmonary is asking ENT consult as it appears to be in glottis/epiglottis area.  Message sent to Dr. Elenore Rota from ENT.  10/24: Patient remained afebrile over the past 24 hours.  Overnight intermittent agitation most likely secondary to delirium with advanced dementia.  Laryngoscopy done by ENT with no obvious abnormalities, mass appeared to be below the larynx and either originating from trachea or esophagus.  Patient is asymptomatic.  ENT is recommending barium swallow and if that is negative then bronchoscopy if family wants to pursue the diagnosis. Discussed with son at bedside and they do not want to be very aggressive which is very appropriate.  Patient is almost 36 with advanced dementia. Palliative care was also consulted. Repeat blood cultures pending, urine cultures with E. coli which shows resistance to ampicillin, ampicillin/sulbactam and Zosyn.  Initial blood cultures with E. coli and Staphylococcus hominis in 1 bottle only-most likely a contaminant.  Vancomycin was discontinued.  10/25: Remained afebrile.  Barium swallow with severe esophageal dysmotility and a small sliding type hiatal hernia, no obvious esophageal mass or stricture identified. Family does not want any invasive measures for diagnosis.  Repeat blood cultures negative. Palliative care was also consulted and she will continue with outpatient palliative care. Patient is being discharged on 6 more days of Ceftin to complete a total of 10-day course.  Patient will continue on her current medications and need to have a close  follow-up with her providers for further recommendations.

## 2022-03-17 NOTE — Progress Notes (Signed)
More alert and follows comand. Pt resting in bed with family at bedside.  Pt did eat breakfast tolerates well.

## 2022-03-17 NOTE — Assessment & Plan Note (Signed)
Continue home as needed Xanax 

## 2022-03-17 NOTE — Assessment & Plan Note (Signed)
Did not met criteria for AKI but creatinine was mildly elevated from her baseline on admission which is now improving with IV fluid. -Monitor renal function -Avoid nephrotoxins

## 2022-03-17 NOTE — Progress Notes (Signed)
Progress Note   Patient: Faith Vasquez YQM:[id number] DOB: 1923/03/01 DOA: 03/16/2022     1 DOS: the patient was seen and examined on 03/17/2022   Brief hospital course: Taken from H&P.  Faith Vasquez is a 88 y.o. female with medical history significant of hypertension, GERD, anxiety, C. difficile colitis, advanced dementia, CKD-3a,  who presents with fever, chills, weakness, fall, worsening mental status. Patient has underlying advanced dementia and lives in a memory care unit.  When family saw her yesterday they immediately called the ambulance.  Apparently had multiple choking events while eating food.  Patient also fell 3 times last night and had bruising to her left knee, right elbow and right leg.  Daughter-in-law was concerned about some dysuria but patient was unable to explain any symptoms due to advanced dementia.  Data reviewed independently and ED Course: pt was found to have WBC 7.0, lactic acid 2.9, INR 1.2, PTT 31, positive urinalysis for UTI (hazy appearance, moderate amount of leukocyte, many bacteria, WBC> 50), slightly worsening renal function.  Temperature 103.5, blood pressure 151/72, heart rate 125, RR 22, oxygen saturation 89% on room air, which improved to 95% on 2 L oxygen. X-ray of left humerus, pelvis, right tibia/fibula negative for bony fracture.  Patient was started on broad-spectrum antibiotics with vancomycin, cefepime and Flagyl for concern of sepsis.  Chest x-ray: Increased hazy opacity along the right heart border is worrisome for infection. Apparent interval increase in rightward deviation of the trachea may be due to positioning. If there is clinical concern for mediastinal pathology, further evaluation with CT chest is recommended.   CT-head and neck: 1. No acute intracranial abnormality. Sequela of chronic microvascular ischemic change. 2. No acute cervical spine fracture. 3. Apparent polypoid soft tissue mass along the posterior aspect of the  trachea. Recommend direct visualization.     EKG: I also personally reviewed.  Sinus rhythm, QTc 566, LAE, LAD, poor R wave progression, ST depression in lateral leads.  10/23: Preliminary blood cultures with E. Coli.  Antibiotics switched to ceftriaxone and Flagyl.  Procalcitonin at 3.65 1/4 blood culture bottles with gram-positive cocci, Staphylococcus species.  Vancomycin was added back and we will repeat blood cultures. Pulmonary was also consulted for the concerning soft tissue mass behind the trachea, pulmonary is asking ENT consult as it appears to be in glottis/epiglottis area.  Message sent to Dr. Kathyrn Sheriff from ENT.   Assessment and Plan: * Severe sepsis [facility]) Patient met severe sepsis criteria, concern of UTI and aspiration pneumonia. Blood cultures with E. coli and 1 out of 4 with staphylococcal species. UA with proteinuria, positive leukocytes and nitrite along with many bacteria.  Urine cultures pending.  Procalcitonin at 3.65. Multiple choking events, per daughter-in-law patient eats too fast and ended up choking. -Switch cefepime with ceftriaxone. -Continue with Flagyl and vancomycin for now. -Repeat blood cultures -Swallow evaluation  HCAP (healthcare-associated pneumonia) Concern of aspiration pneumonia.  Currently on 2 L of oxygen with procalcitonin at 3.65. -Swallow evaluation -Ceftriaxone and Flagyl -Strep pneumo and Legionella pending  Aspiration pneumonia ([place]) - See above  UTI (urinary tract infection) Unable to explain any urinary symptoms due to advanced dementia. - See above  Advanced dementia [facility]) Patient is from a memory care unit. --fall precaution -Delirium precautions  Acute metabolic encephalopathy Improved.  Currently at baseline. Most likely secondary to sepsis. High risk for delirium due to history of advanced dementia  Essential hypertension Currently blood pressure within goal, intermittently elevated.  Home amlodipine was initially  held for concern of blood pressure becoming soft with sepsis. -Restart home amlodipine -Continue to monitor  Chronic kidney disease, stage 3a ([place]) Did not met criteria for AKI but creatinine was mildly elevated from her baseline on admission which is now improving with IV fluid. -Monitor renal function -Avoid nephrotoxins  Fall Imaging negative for any acute injury PT/OT evaluation Supportive care  Thrombocytopenia ([facility]) Mildly elevated LDH.  Seems chronic. -Continue to monitor  Anxiety - Continue home as needed Xanax  Prolonged QT interval Hold risperidone -Avoid using QT prolonging medications, such as Zofran. -As needed Benadryl for nausea vomiting  Abnormal finding on imaging Chest x-ray showed interval increase in rightward deviation of the trachea. CT of C spin showed apparent polypoid soft tissue mass along the posterior aspect of the trachea. Recommend direct visualization. -Pulmonary consult   Subjective: Patient was seen and examined today.  Very hard of hearing elderly lady.  Most of the history from daughter-in-law at bedside.  Patient denies any complaints, unable to explain any urinary symptoms, just telling that she is feeling very weak.  Physical Exam: Vitals:   03/17/22 0800 03/17/22 0900 03/17/22 1108 03/17/22 1200  BP: 138/67 (!) 147/80  138/78  Pulse: 75 83 73 85  Resp: (!) 22 (!) 22 (!) 26 (!) 22  Temp:   97.9 F (36.6 C)   TempSrc:   Oral   SpO2: 96% 97% 99% 94%  Weight:      Height:       General.  Frail, hard of hearing elderly lady, in no acute distress. Pulmonary.  Right basilar rhonchi, normal respiratory effort. CV.  Regular rate and rhythm, no JVD, rub or murmur. Abdomen.  Soft, nontender, nondistended, BS positive. CNS.  Alert and oriented .  No focal neurologic deficit. Extremities.  No edema, no cyanosis, pulses intact and symmetrical. Psychiatry.  Judgment and insight appears impaired.  Data Reviewed: Prior data  reviewed  Family Communication: Discussed with daughter-in-law at bedside  Disposition: Status is: Inpatient Remains inpatient appropriate because: Severity of illness   Planned Discharge Destination: Skilled nursing facility  DVT prophylaxis.  Lovenox Time spent: 50 minutes  This record has been created using Systems analyst. Errors have been sought and corrected,but may not always be located. Such creation errors do not reflect on the standard of care.  Author: Lorella Nimrod, MD 03/17/2022 1:17 PM  For on call review [email or web address].

## 2022-03-17 NOTE — Evaluation (Addendum)
Clinical/Bedside Swallow Evaluation Patient Details  Name: Faith Vasquez MRN: [id number] Date of Birth: 1922-12-11  Today's Date: 03/17/2022 Time: SLP Start Time (ACUTE ONLY): 1325 SLP Stop Time (ACUTE ONLY): 1410 SLP Time Calculation (min) (ACUTE ONLY): 45 min  Past Medical History:  Past Medical History:  Diagnosis Date   Diverticulosis    History of Clostridium difficile infection    uncertain dates, possibly in 2017 or 2018   Past Surgical History:  Past Surgical History:  Procedure Laterality Date   ABDOMINAL HYSTERECTOMY     ANTERIOR APPROACH HEMI HIP ARTHROPLASTY Right 12/21/2021   Procedure: ANTERIOR APPROACH HEMI HIP ARTHROPLASTY;  Surgeon: Kennedy Bucker, MD;  Location: [facility] ORS;  Service: Orthopedics;  Laterality: Right;   CHOLECYSTECTOMY     HIP ARTHROPLASTY Left 03/25/2020   Procedure: ARTHROPLASTY BIPOLAR HIP (HEMIARTHROPLASTY);  Surgeon: Kennedy Bucker, MD;  Location: [facility] ORS;  Service: Orthopedics;  Laterality: Left;   HPI:  Pt  is a 88 y.o. female with medical history significant of hypertension, GERD, anxiety, C. difficile colitis, advanced dementia, CKD-3a, and HOH who presents with fever, chills, weakness, multiple Falls, worsening mental status; suspected UTI.   Per her daughter-in-law at the bedside, patient has Dementia.  At her normal baseline, patient recognizes family members, oriented to place, most of the time patient is confused with time.  In the past several days, patient is more confused.  She recognizes family member, but disoriented to place and time.  She moves all extremities.  No facial droop or slurred speech.  Patient has generalized weakness.  She was noted to have fever, chills, shaking at admit to the ED.    CXR: Increased hazy opacity along the right heart border is worrisome for  infection. Apparent interval increase in rightward deviation of the trachea may be due to positioning. If there is clinical concern for mediastinal pathology, further  evaluation with CT chest is recommended.     CT of Head and Spine: No acute intracranial abnormality. Sequela of chronic  microvascular ischemic change.  2. No acute cervical spine fracture.  3. Apparent polypoid soft tissue mass along the posterior aspect of  the trachea. Recommend direct visualization.     Assessment / Plan / Recommendation  Clinical Impression   Pt seen for BSE today. Pt awake, verbal. Confusion noted - baseline dx of Dementia. Required redirecting frequently during session d/t excessive talking and looking out the door. She was anxious about needing to see her MD to "approve this"; and family returning to her room "soon".  Pt is HOH. Pt is on [place] O2 support 2L; afebrile. WBC wnl.   Pt appears to present w/ no gross oropharyngeal phase dysphagia; no difficulty swallowing in setting of declined Cognitive status; Baseline Dementia. Family has reported "patient eats too fast and ended up choking". ANY Cognitive decline can impact overall awareness and timing of swallowing and safety during po tasks which increases risk for aspiration, choking.  Pt's risk for aspiration appears greatly reduced when following general aspiration precautions, Monitoring pt w/ oral intake, and using a slightly modified diet consistency of Cut foods.  She required min-mod verbal/visual cues for follow through during po tasks and self-feeding.        Pt consumed several trials of ice chips, purees, soft solids and thin liquids via Cup w/ No overt clinical s/s of aspiration noted: no decline in vocal quality; no cough, and no decline in respiratory status during/post trials. O2 sats remained 97%. Oral phase was quite adequate for  bolus management, mastication, and oral clearing of the boluses given. Mastication of soft solids was appropriate when provided SMALL bites of moistened foods. Pt given setup then self-fed w/ min-mod cues/support d/t the Cognitive decline -- self-feeding improves safety of swallowing  overall.  OM Exam appeared [facility] w/ No unilateral weakness noted. Some confusion of OM tasks.         In setting of baseline Dementia, Cognitive decline, recommend a more dysphagia level 3(soft solid foods moistened for ease of oral phase) w/ thin liquids VIA CUP; general aspiration precautions; reduce Distractions during meals and engage pt for Self-feeding during meals. Pills 1 at a time w/ Water, or in Puree for safer swallowing if needed d/t the Cognitive decline. Supervision at meals as needed d/t Impulsive eating behaviors per Family report. MD/NSG updated.  Education completed w/ Family members in Room on the evaluation results/recommendations.  ST services recommends follow w/ Palliative Care for GOC and education re: impact of Cognitive decline/Dementia on oral intake, swallowing. Suspect pt is close to/at her baseline.  Precautions posted in room.  SUSPECT PT CAN MOVE BACK TO HER USUAL DIET POST DISCHARGE AND POST ACUITY OF ILLNESS WHEN CONFUSION AND ANXIETY MAY BE A LITTLE LESS. NEEDS SUPERVISION W/ MEALS TO EAT SLOWLY; USE SMALL, SINGLE BITES. SLP Visit Diagnosis: Dysphagia, unspecified (R13.10) (pt has Baseline Dementia, Cognitive decline which impacts her attention/awareness during tasks)    Aspiration Risk   (reduced following general aspiration precautions)    Diet Recommendation   a more dysphagia level 3(soft solid foods moistened for ease of oral phase) w/ thin liquids VIA CUP; general aspiration precautions; reduce Distractions during meals and engage pt for Self-feeding during meals. Supervision at meals as needed d/t Impulsive eating behaviors per Family report.  Medication Administration: Whole meds with liquid (1 at a time; or in a Puree if needed)    Other  Recommendations Recommended Consults:  (Dietician f/u) Oral Care Recommendations: Oral care BID;Oral care before and after PO;Staff/trained caregiver to provide oral care (support pt) Other Recommendations:  (n/a)     Recommendations for follow up therapy are one component of a multi-disciplinary discharge planning process, led by the attending physician.  Recommendations may be updated based on patient status, additional functional criteria and insurance authorization.  Follow up Recommendations No SLP follow up; Palliative Care for GOC re: advancing Dementia and Cognitive decline; management     Assistance Recommended at Discharge Intermittent Supervision/Assistance (in setting of Dementia, Cognitive decline - supervision at meals)  Functional Status Assessment Patient has had a recent decline in their functional status and demonstrates the ability to make significant improvements in function in a reasonable and predictable amount of time.  Frequency and Duration  (n/a)   (n/a)       Prognosis Prognosis for Safe Diet Advancement: Fair (-Good) Barriers to Reach Goals: Time post onset;Severity of deficits;Behavior;Cognitive deficits Barriers/Prognosis Comment: Dementia; Cognitive decline impacting awareness during meals = impulsive eating behaviors      Swallow Study   General Date of Onset: 03/16/22 HPI: Pt  is a 88 y.o. female with medical history significant of hypertension, GERD, anxiety, C. difficile colitis, advanced dementia, CKD-3a,  who presents with fever, chills, weakness, multiple Falls, worsening mental status; suspected UTI.   Per her daughter-in-law at the bedside, patient has Dementia.  At her normal baseline, patient recognizes family members, oriented to place, most of the time patient is confused with time.  In the past several days, patient is more confused.  She recognizes family member, but disoriented to place and time.  She moves all extremities.  No facial droop or slurred speech.  Patient has generalized weakness.  She was noted to have fever, chills, shaking at admit to the ED.   CXR: Increased hazy opacity along the right heart border is worrisome for  infection. Apparent  interval increase in rightward deviation of the trachea may be due to positioning. If there is clinical concern for mediastinal pathology, further evaluation with CT chest is recommended.    CT of Head and Spine: No acute intracranial abnormality. Sequela of chronic  microvascular ischemic change.  2. No acute cervical spine fracture.  3. Apparent polypoid soft tissue mass along the posterior aspect of  the trachea. Recommend direct visualization. Type of Study: Bedside Swallow Evaluation Previous Swallow Assessment: none Diet Prior to this Study: Dysphagia 2 (chopped);Thin liquids (currently; regular diet per pt at home) Temperature Spikes Noted: No (wbc 6.7) Respiratory Status: Nasal cannula (2L) History of Recent Intubation: No Behavior/Cognition: Alert;Cooperative;Pleasant mood;Confused;Distractible;Requires cueing (baseline Dementia) Oral Cavity Assessment: Within Functional Limits Oral Care Completed by SLP: Recent completion by staff Oral Cavity - Dentition: Adequate natural dentition Vision: Functional for self-feeding Self-Feeding Abilities: Able to feed self;Needs assist;Needs set up (d/t confusion) Patient Positioning: Upright in bed (needed assistance in sitting fully upright) Baseline Vocal Quality: Normal Volitional Cough: Strong Volitional Swallow: Able to elicit    Oral/Motor/Sensory Function Overall Oral Motor/Sensory Function: Within functional limits   Ice Chips Ice chips: Within functional limits Presentation: Spoon (fed; 2 trials)   Thin Liquid Thin Liquid: Within functional limits Presentation: Cup;Self Fed (~6 ozs total) Other Comments: water, then apple juice    Nectar Thick Nectar Thick Liquid: Not tested   Honey Thick Honey Thick Liquid: Not tested   Puree Puree: Within functional limits Presentation: Spoon (fed; 5 trials)   Solid     Solid: Within functional limits Presentation: Self Fed (9 trials) Other Comments: graham crackers       [name], MS, CCC-SLP Speech Language Pathologist Rehab Services; [place] [phone] (ascom) Kylinn Shropshire 03/17/2022,2:21 PM

## 2022-03-17 NOTE — Consult Note (Signed)
Faith Vasquez, Faith Vasquez [id number] [birth year] Arnetha Courser, MD  Reason for Consult: Evaluate mass noted in the trachea  HPI: The patient is a 88 year old white female who was living in a memory care unit of skilled nursing facility.  She normally is up and about walking but in the last couple days had worsening weakness, chills, and actually fell 3 times over 24 hours.  She was becoming less responsive, tachycardic and with fever.  She was taken by EMS to the emergency room for evaluation.  Chest x-ray suggested possible pneumonia.  She was noted to have a urinary tract infection as well and started on multiple antibiotics.  She has evidence of early sepsis.  She has been at the hospital now for over 48 hours and has responded well.  She no longer has fever and is more alert although she still has evidence of dementia.  She was noted on x-rays of her cervical spine that there appears to be a mass in the upper trachea on the posterior wall that may involve the esophagus.  She is seen today to evaluate her larynx and see if we can sort out what possibly is the cause of this mass.  On questioning the patient has not been hoarse or had any problems with her voice.  She feels like she breathes fine and has not been short of breath.  She has not had an unusual cough or bringing up any sputum.  She has not had any dysphagia and feels like she can swallow everything fine.  She has been on a chopped food diet at the nursing home and takes thin liquids.  She occasionally has a coughing spell but her son feels this may be because she never drinks liquids along with the food and tends to wolf her food down very fast.  She complains of no discomfort whatsoever in her throat or neck and no sensation of any problems there at all.  Allergies:  Allergies  Allergen Reactions   Codeine    Iodine    Macrobid [Nitrofurantoin] Other (See Comments)    Unknown reaction.   Ciprofloxacin Rash    Other reaction(s): whelps     ROS: Review of systems normal other than 12 systems except per HPI.  PMH:  Past Medical History:  Diagnosis Date   Diverticulosis    History of Clostridium difficile infection    uncertain dates, possibly in 2017 or 2018    FH: History reviewed. No pertinent family history.  SH:  Social History   Socioeconomic History   Marital status: Widowed    Spouse name: Not on file   Number of children: Not on file   Years of education: Not on file   Highest education level: Not on file  Occupational History   Not on file  Tobacco Use   Smoking status: Never   Smokeless tobacco: Never  Substance and Sexual Activity   Alcohol use: Never   Drug use: Never   Sexual activity: Not on file  Other Topics Concern   Not on file  Social History Narrative   Not on file   Social Determinants of Health   Financial Resource Strain: Not on file  Food Insecurity: No Food Insecurity (03/17/2022)   Hunger Vital Sign    Worried About Running Out of Food in the Last Year: Never true    Ran Out of Food in the Last Year: Never true  Transportation Needs: Not on file  Physical Activity: Not on file  Stress: Not  on file  Social Connections: Not on file  Intimate Partner Violence: Not on file    PSH:  Past Surgical History:  Procedure Laterality Date   ABDOMINAL HYSTERECTOMY     ANTERIOR APPROACH HEMI HIP ARTHROPLASTY Right 12/21/2021   Procedure: ANTERIOR APPROACH HEMI HIP ARTHROPLASTY;  Surgeon: Hessie Knows, MD;  Location: [facility] ORS;  Service: Orthopedics;  Laterality: Right;   CHOLECYSTECTOMY     HIP ARTHROPLASTY Left 03/25/2020   Procedure: ARTHROPLASTY BIPOLAR HIP (HEMIARTHROPLASTY);  Surgeon: Hessie Knows, MD;  Location: [facility] ORS;  Service: Orthopedics;  Laterality: Left;    Physical  Exam: Her neck is negative for any nodes or masses.  Her oropharynx looks clear.  She has good tongue mobility.  Her nose looks open anteriorly with no signs of inflammation.  The flexible scope  was passed through her left nostril to visualize the hypopharynx and larynx.  This is dictated in detail elsewhere.  Her vocal cords are pearly white move all the midline with no sign of any lesions.  She has no mucous collecting in her hypopharynx.  There are no lesions in the hypopharynx that I can tell and her mucosa looks very normal.  I reviewed her CT scan at length and went over this with her son.  There appears to be a mass in the posterior wall of the trachea just beneath the larynx that may extend into the esophagus or come from the esophagus.  No other abnormalities are noted in the hypopharynx or laryngeal area.   A/P: Patient has an abnormal finding in the posterior tracheal wall beneath the larynx.  I cannot see that from above.  It is interesting that she has had absolutely no symptoms whatsoever from this that I can elicit.  I think a barium swallow is a reasonable thing to evaluate the diameter and distention of the esophagus in this area.  If the barium swallow was completely clear then I think a bronchoscopy is necessary to look at this area and potentially biopsy it.  This may be a benign process as she has no symptoms here whatsoever.  Since this is not causing any problems at all, it may not be necessary to do any specific procedures at this time because of her age and dementia.   Huey Romans 03/17/2022 8:33 PM

## 2022-03-17 NOTE — Assessment & Plan Note (Signed)
Patient met severe sepsis criteria, concern of UTI and aspiration pneumonia. Blood cultures with E. coli and 1 out of 4 with staphylococcal species. UA with proteinuria, positive leukocytes and nitrite along with many bacteria.  Urine cultures pending.  Procalcitonin at 3.65. Multiple choking events, per daughter-in-law patient eats too fast and ended up choking. -Switch cefepime with ceftriaxone. -Continue with Flagyl and vancomycin for now. -Repeat blood cultures -Swallow evaluation

## 2022-03-17 NOTE — Assessment & Plan Note (Signed)
Patient is from a memory care unit. --fall precaution -Delirium precautions

## 2022-03-17 NOTE — Progress Notes (Signed)
RN walked into patients room and was very adamant about leaving hospital and would not get back into her bed. Son at bedside. Patient was then moved to her new room in 218 and had to be placed in bed due to not following directions and becoming combative and aggressive with staff. Neomia Glass, NP was notified and came to assess patient at bedside. RN was able to get patient to take PM medications and monitoring her to make sure she has calmed down. She now has a 1:1 Air cabin crew and is in bed resting. Still only alert to self, but calming down some. Patient hooked up to tele and CPOX per order. Falls mat placed at bedside. She denies any pain. Will continue to monitor.

## 2022-03-17 NOTE — Progress Notes (Signed)
PHARMACY - PHYSICIAN COMMUNICATION CRITICAL VALUE ALERT - BLOOD CULTURE IDENTIFICATION (BCID)  Results for orders placed or performed during the hospital encounter of 03/16/22  Blood Culture (routine x 2)     Status: None (Preliminary result)   Collection Time: 03/16/22  1:55 PM   Specimen: BLOOD  Result Value Ref Range Status   Specimen Description BLOOD RIGHT ANTECUBITAL  Final   Special Requests   Final    BOTTLES DRAWN AEROBIC AND ANAEROBIC Blood Culture adequate volume   Culture  Setup Time   Final    GRAM NEGATIVE RODS AEROBIC BOTTLE ONLY Organism ID to follow CRITICAL RESULT CALLED TO, READ BACK BY AND VERIFIED WITHDawayne Cirri PHARMD [id number] 03/17/22 HNM Performed at [facility], [address] [place], [place] [address]    Culture GRAM NEGATIVE RODS  Final   Report Status PENDING  Incomplete  Blood Culture ID Panel (Reflexed)     Status: Abnormal   Collection Time: 03/16/22  1:55 PM  Result Value Ref Range Status   Enterococcus faecalis NOT DETECTED NOT DETECTED Final   Enterococcus Faecium NOT DETECTED NOT DETECTED Final   Listeria monocytogenes NOT DETECTED NOT DETECTED Final   Staphylococcus species NOT DETECTED NOT DETECTED Final   Staphylococcus aureus (BCID) NOT DETECTED NOT DETECTED Final   Staphylococcus epidermidis NOT DETECTED NOT DETECTED Final   Staphylococcus lugdunensis NOT DETECTED NOT DETECTED Final   Streptococcus species NOT DETECTED NOT DETECTED Final   Streptococcus agalactiae NOT DETECTED NOT DETECTED Final   Streptococcus pneumoniae NOT DETECTED NOT DETECTED Final   Streptococcus pyogenes NOT DETECTED NOT DETECTED Final   A.calcoaceticus-baumannii NOT DETECTED NOT DETECTED Final   Bacteroides fragilis NOT DETECTED NOT DETECTED Final   Enterobacterales DETECTED (A) NOT DETECTED Final    Comment: Enterobacterales represent a large order of gram negative bacteria, not a single organism. CRITICAL RESULT CALLED TO, READ BACK BY AND VERIFIED  WITH: Chais Fehringer PHARMD [id number] 03/17/22 HNM    Enterobacter cloacae complex NOT DETECTED NOT DETECTED Final   Escherichia coli DETECTED (A) NOT DETECTED Final    Comment: CRITICAL RESULT CALLED TO, READ BACK BY AND VERIFIED WITH: Roey Coopman PHARMD 0416 03/17/22 HNM    Klebsiella aerogenes NOT DETECTED NOT DETECTED Final   Klebsiella oxytoca NOT DETECTED NOT DETECTED Final   Klebsiella pneumoniae NOT DETECTED NOT DETECTED Final   Proteus species NOT DETECTED NOT DETECTED Final   Salmonella species NOT DETECTED NOT DETECTED Final   Serratia marcescens NOT DETECTED NOT DETECTED Final   Haemophilus influenzae NOT DETECTED NOT DETECTED Final   Neisseria meningitidis NOT DETECTED NOT DETECTED Final   Pseudomonas aeruginosa NOT DETECTED NOT DETECTED Final   Stenotrophomonas maltophilia NOT DETECTED NOT DETECTED Final   Candida albicans NOT DETECTED NOT DETECTED Final   Candida auris NOT DETECTED NOT DETECTED Final   Candida glabrata NOT DETECTED NOT DETECTED Final   Candida krusei NOT DETECTED NOT DETECTED Final   Candida parapsilosis NOT DETECTED NOT DETECTED Final   Candida tropicalis NOT DETECTED NOT DETECTED Final   Cryptococcus neoformans/gattii NOT DETECTED NOT DETECTED Final   CTX-M ESBL NOT DETECTED NOT DETECTED Final   Carbapenem resistance IMP NOT DETECTED NOT DETECTED Final   Carbapenem resistance KPC NOT DETECTED NOT DETECTED Final   Carbapenem resistance NDM NOT DETECTED NOT DETECTED Final   Carbapenem resist OXA 48 LIKE NOT DETECTED NOT DETECTED Final   Carbapenem resistance VIM NOT DETECTED NOT DETECTED Final    Comment: Performed at [facility],  [address], [place], [place] [address]  Resp Panel by RT-PCR (Flu A&B, Covid) Anterior Nasal Swab     Status: None   Collection Time: 03/16/22  3:00 PM   Specimen: Anterior Nasal Swab  Result Value Ref Range Status   SARS Coronavirus 2 by RT PCR NEGATIVE NEGATIVE Final    Comment: (NOTE) SARS-CoV-2 target  nucleic acids are NOT DETECTED.  The SARS-CoV-2 RNA is generally detectable in upper respiratory specimens during the acute phase of infection. The lowest concentration of SARS-CoV-2 viral copies this assay can detect is 138 copies/mL. A negative result does not preclude SARS-Cov-2 infection and should not be used as the sole basis for treatment or other patient management decisions. A negative result may occur with  improper specimen collection/handling, submission of specimen other than nasopharyngeal swab, presence of viral mutation(s) within the areas targeted by this assay, and inadequate number of viral copies(<138 copies/mL). A negative result must be combined with clinical observations, patient history, and epidemiological information. The expected result is Negative.  Fact Sheet for Patients:  BloggerCourse.com  Fact Sheet for Healthcare Providers:  SeriousBroker.it  This test is no t yet approved or cleared by the [place] FDA and  has been authorized for detection and/or diagnosis of SARS-CoV-2 by FDA under an Emergency Use Authorization (EUA). This EUA will remain  in effect (meaning this test can be used) for the duration of the COVID-19 declaration under Section 564(b)(1) of the Act, 21 U.S.C.section 360bbb-3(b)(1), unless the authorization is terminated  or revoked sooner.       Influenza A by PCR NEGATIVE NEGATIVE Final   Influenza B by PCR NEGATIVE NEGATIVE Final    Comment: (NOTE) The Xpert Xpress SARS-CoV-2/FLU/RSV plus assay is intended as an aid in the diagnosis of influenza from Nasopharyngeal swab specimens and should not be used as a sole basis for treatment. Nasal washings and aspirates are unacceptable for Xpert Xpress SARS-CoV-2/FLU/RSV testing.  Fact Sheet for Patients: BloggerCourse.com  Fact Sheet for Healthcare  Providers: SeriousBroker.it  This test is not yet approved or cleared by the [place] FDA and has been authorized for detection and/or diagnosis of SARS-CoV-2 by FDA under an Emergency Use Authorization (EUA). This EUA will remain in effect (meaning this test can be used) for the duration of the COVID-19 declaration under Section 564(b)(1) of the Act, 21 U.S.C. section 360bbb-3(b)(1), unless the authorization is terminated or revoked.  Performed at [facility], [address] [place], [place] [address]   Urine Culture     Status: None (Preliminary result)   Collection Time: 03/16/22  3:00 PM   Specimen: Urine, Random  Result Value Ref Range Status   Specimen Description   Final    URINE, RANDOM Performed at [facility], [address] [place], [place] [address]    Special Requests   Final    NONE Performed at [facility], [address], [place], [place] [address]    Culture PENDING  Incomplete   Report Status PENDING  Incomplete  MRSA Next Gen by PCR, Nasal     Status: None   Collection Time: 03/16/22  9:22 PM   Specimen: Nasal Mucosa; Nasal Swab  Result Value Ref Range Status   MRSA by PCR Next Gen NOT DETECTED NOT DETECTED Final    Comment: (NOTE) The GeneXpert MRSA Assay (FDA approved for NASAL specimens only), is one component of a comprehensive MRSA colonization surveillance program. It is not intended to diagnose MRSA  infection nor to guide or monitor treatment for MRSA infections. Test performance is not FDA approved in patients less than 39 years old. Performed at [facility], [address] [place], [place] [address]    Initial BCID results:  1 (aerobic) bottle of 4 w/ E. Coli, no resistance.  Pt currently on Cefepime & Vancomycin.  Name of provider contacted: Morton Amy, NP   Changes to prescribed antibiotics required: Continue current abx pending additional culture results.  Renda Rolls, PharmD, [facility] 03/17/2022 4:23 AM

## 2022-03-17 NOTE — Consult Note (Addendum)
Pharmacy Antibiotic Note  Faith Vasquez is a 88 y.o. female admitted on 03/16/2022 with bacteremia.  Blood cultures with GNR (BCID detected E coli) and now GPC in one bottle of each set (BCID detected Staphylococcus species).  Pharmacy has been consulted for vancomycin dosing.  Today, 03/17/2022 Day #2 antibiotics Renal: Scr improved WBC WNL Tm/24h 103.5 Vancomycin 1gm given 10/22 at 14:51 Cefepime changed to ceftriaxone for E coli in blood cx  Plan: Vancomycin 1gm IV q48h - next dose due 10/24 at 10a Goal AUC 400-600  Est AUC: 440   Est Cmax: 33 Est Cmin: 9.3 Calculated with SCr 1.10 Continue to monitor and dose adjust antibiotics according to renal function and indication Continue ceftriaxone and metronidazole as ordered Await final blood culture results  Height: 5\' 5"  (165.1 cm) Weight: 59 kg (130 lb) IBW/kg (Calculated) : 57  Temp (24hrs), Avg:98.6 F (37 C), Min:97.6 F (36.4 C), Max:103.5 F (39.7 C)  Recent Labs  Lab 03/16/22 1355 03/16/22 1356 03/16/22 1556 03/17/22 0440  WBC 7.0  --   --  6.7  CREATININE 1.21*  --   --  1.10*  LATICACIDVEN  --  2.9* 2.8*  --      Estimated Creatinine Clearance: 26.9 mL/min (A) (by C-G formula based on SCr of 1.1 mg/dL (H)).    Allergies  Allergen Reactions   Codeine    Iodine    Macrobid [Nitrofurantoin] Other (See Comments)    Unknown reaction.   Ciprofloxacin Rash    Other reaction(s): whelps    Antimicrobials this admission: 10/22 Cefepime >> 10/23 10/22 Vancomycin >> 10/22 MTZ >> 10/23 Ceftriaxone >>  Microbiology results: 10/22 MRSA PCR: neg 10/22 Ucx: pending 10/22 Bcx: 1/4 GNR, BCID E coli, GPC in one bottle of each set - Staph spp only    Thank you for allowing pharmacy to be a part of this patient's care.  Doreene Eland, PharmD, BCPS, BCIDP Work Cell: [phone] 03/17/2022 11:25 AM

## 2022-03-17 NOTE — Assessment & Plan Note (Signed)
See above

## 2022-03-17 NOTE — Plan of Care (Signed)

## 2022-03-17 NOTE — Progress Notes (Signed)
Patient is getting very confused. She is trying to get up on her own. She is weak. Order received for a Air cabin crew

## 2022-03-17 NOTE — Assessment & Plan Note (Signed)
Chest x-ray showed interval increase in rightward deviation of the trachea. CT of C spin showed apparent polypoid soft tissue mass along the posterior aspect of the trachea. Recommend direct visualization. -Pulmonary consult

## 2022-03-17 NOTE — Assessment & Plan Note (Addendum)
Mildly elevated LDH.  Seems chronic. -Continue to monitor

## 2022-03-17 NOTE — Assessment & Plan Note (Signed)
Currently blood pressure within goal, intermittently elevated.  Home amlodipine was initially held for concern of blood pressure becoming soft with sepsis. -Restart home amlodipine -Continue to monitor

## 2022-03-17 NOTE — Plan of Care (Signed)
  Problem: Activity: Goal: Ability to tolerate increased activity will improve Outcome: Progressing   Problem: Clinical Measurements: Goal: Ability to maintain a body temperature in the normal range will improve Outcome: Progressing   Problem: Respiratory: Goal: Ability to maintain adequate ventilation will improve Outcome: Progressing Goal: Ability to maintain a clear airway will improve Outcome: Progressing   Problem: Education: Goal: Knowledge of General Education information will improve Description: Including pain rating scale, medication(s)/side effects and non-pharmacologic comfort measures Outcome: Progressing   Problem: Clinical Measurements: Goal: Ability to maintain clinical measurements within normal limits will improve Outcome: Progressing

## 2022-03-17 NOTE — Assessment & Plan Note (Addendum)
Unable to explain any urinary symptoms due to advanced dementia. - See above

## 2022-03-17 NOTE — Assessment & Plan Note (Signed)
Concern of aspiration pneumonia.  Currently on 2 L of oxygen with procalcitonin at 3.65. -Swallow evaluation -Ceftriaxone and Flagyl -Strep pneumo and Legionella pending

## 2022-03-17 NOTE — Progress Notes (Signed)
PHARMACY - PHYSICIAN COMMUNICATION CRITICAL VALUE ALERT - BLOOD CULTURE IDENTIFICATION (BCID)  Faith Vasquez is an 88 y.o. female who presented to [facility] on 03/16/2022 with a chief complaint of worsening altered mental status, falls, weakness  Assessment:  Previously E coli was reported in Bcx,  Now GPC reported in one bottle of each set, BCID = Staphylococcus species (Methicillin resistance genes are NOT performed in these cases)  Name of physician (or Provider) Contacted: Dr Reesa Chew  Current antibiotics: Ceftriaxone/metronidazole  Changes to prescribed antibiotics recommended:  -resume vancomycin   Results for orders placed or performed during the hospital encounter of 03/16/22  Blood Culture ID Panel (Reflexed) (Collected: 03/17/2022  8:50 AM)  Result Value Ref Range   Enterococcus faecalis NOT DETECTED NOT DETECTED   Enterococcus Faecium NOT DETECTED NOT DETECTED   Listeria monocytogenes NOT DETECTED NOT DETECTED   Staphylococcus species DETECTED (A) NOT DETECTED   Staphylococcus aureus (BCID) NOT DETECTED NOT DETECTED   Staphylococcus epidermidis NOT DETECTED NOT DETECTED   Staphylococcus lugdunensis NOT DETECTED NOT DETECTED   Streptococcus species NOT DETECTED NOT DETECTED   Streptococcus agalactiae NOT DETECTED NOT DETECTED   Streptococcus pneumoniae NOT DETECTED NOT DETECTED   Streptococcus pyogenes NOT DETECTED NOT DETECTED   A.calcoaceticus-baumannii NOT DETECTED NOT DETECTED   Bacteroides fragilis NOT DETECTED NOT DETECTED   Enterobacterales NOT DETECTED NOT DETECTED   Enterobacter cloacae complex NOT DETECTED NOT DETECTED   Escherichia coli NOT DETECTED NOT DETECTED   Klebsiella aerogenes NOT DETECTED NOT DETECTED   Klebsiella oxytoca NOT DETECTED NOT DETECTED   Klebsiella pneumoniae NOT DETECTED NOT DETECTED   Proteus species NOT DETECTED NOT DETECTED   Salmonella species NOT DETECTED NOT DETECTED   Serratia marcescens NOT DETECTED NOT DETECTED   Haemophilus  influenzae NOT DETECTED NOT DETECTED   Neisseria meningitidis NOT DETECTED NOT DETECTED   Pseudomonas aeruginosa NOT DETECTED NOT DETECTED   Stenotrophomonas maltophilia NOT DETECTED NOT DETECTED   Candida albicans NOT DETECTED NOT DETECTED   Candida auris NOT DETECTED NOT DETECTED   Candida glabrata NOT DETECTED NOT DETECTED   Candida krusei NOT DETECTED NOT DETECTED   Candida parapsilosis NOT DETECTED NOT DETECTED   Candida tropicalis NOT DETECTED NOT DETECTED   Cryptococcus neoformans/gattii NOT DETECTED NOT DETECTED    Doreene Eland, PharmD, BCPS, BCIDP Work Cell: [phone] 03/17/2022 11:27 AM

## 2022-03-17 NOTE — Assessment & Plan Note (Signed)
Hold risperidone -Avoid using QT prolonging medications, such as Zofran. -As needed Benadryl for nausea vomiting

## 2022-03-17 NOTE — Assessment & Plan Note (Signed)
Imaging negative for any acute injury PT/OT evaluation Supportive care

## 2022-03-17 NOTE — Assessment & Plan Note (Signed)
Improved.  Currently at baseline. Most likely secondary to sepsis. High risk for delirium due to history of advanced dementia

## 2022-03-17 NOTE — Plan of Care (Signed)
Attempted to see patient x2, not in room.  We will try to see patient tomorrow.  Renold Don, MD Advanced Bronchoscopy PCCM [facility]

## 2022-03-18 ENCOUNTER — Inpatient Hospital Stay: Payer: Medicare Other

## 2022-03-18 DIAGNOSIS — A419 Sepsis, unspecified organism: Secondary | ICD-10-CM | POA: Diagnosis not present

## 2022-03-18 DIAGNOSIS — R6520 Severe sepsis without septic shock: Secondary | ICD-10-CM | POA: Diagnosis not present

## 2022-03-18 DIAGNOSIS — R410 Disorientation, unspecified: Secondary | ICD-10-CM

## 2022-03-18 LAB — CULTURE, BLOOD (ROUTINE X 2): Special Requests: ADEQUATE

## 2022-03-18 LAB — URINE CULTURE: Culture: 20000 — AB

## 2022-03-18 LAB — GLUCOSE, CAPILLARY: Glucose-Capillary: 94 mg/dL (ref 70–99)

## 2022-03-18 LAB — CREATININE, SERUM
Creatinine, Ser: 1.15 mg/dL — ABNORMAL HIGH (ref 0.44–1.00)
GFR, Estimated: 44 mL/min — ABNORMAL LOW (ref >60)

## 2022-03-18 MED ORDER — LORAZEPAM 2 MG/ML IJ SOLN
0.5000 mg | Freq: Once | INTRAMUSCULAR | Status: AC
  Administered 2022-03-18: 0.5 mg via INTRAVENOUS
  Filled 2022-03-18: qty 1

## 2022-03-18 NOTE — Assessment & Plan Note (Signed)
Concern of aspiration pneumonia.  Currently on 2 L of oxygen with procalcitonin at 3.65. -Swallow evaluation -Ceftriaxone and Flagyl -Strep pneumo negative and Legionella pending

## 2022-03-18 NOTE — Progress Notes (Signed)
Patient was assisted to the [facility] two times for BM and to urinate. She is now resting quietly.

## 2022-03-18 NOTE — Assessment & Plan Note (Signed)
Patient is having delirium with intermittent agitation and becoming combative. Got 1 dose of 0.5 mg Ativan this morning as she was hitting and biting nursing staff.  Trying to avoid Haldol due to prolonged QTc. Patient is high risk for delirium with history of advanced dementia -Continue to monitor -Delirium precautions

## 2022-03-18 NOTE — Assessment & Plan Note (Signed)
Chest x-ray showed interval increase in rightward deviation of the trachea. CT of C spin showed apparent polypoid soft tissue mass along the posterior aspect of the trachea. Recommend direct visualization. -ENT was consulted and laryngoscopy was without any obvious abnormality until above the vocal cord and they were recommending this mass might be originating either from trachea or esophagus. -Barium swallow studies ordered -Will be high risk for bronchoscopy-family does not want very aggressive diagnostic measures.

## 2022-03-18 NOTE — Assessment & Plan Note (Signed)
Unable to explain any urinary symptoms due to advanced dementia. - See above 

## 2022-03-18 NOTE — Assessment & Plan Note (Signed)
Patient met severe sepsis criteria, concern of UTI and aspiration pneumonia. Blood and urine cultures with E. coli and 1 out of 4 with staphylococcal species, which is most likely a contaminant.  Repeat cultures pending  Procalcitonin at 3.65. Multiple choking events, per daughter-in-law patient eats too fast and ended up choking.  Patient received cefepime, Flagyl and vancomycin initially.  Later switched to ceftriaxone, Flagyl and vancomycin.  We will discontinue vancomycin today. -Continue with ceftriaxone and Flagyl -Swallow evaluation

## 2022-03-18 NOTE — Consult Note (Signed)
NAME:  Faith Vasquez, MRN:  [id number], DOB:  1922-10-15, LOS: 2 ADMISSION DATE:  03/16/2022, CONSULTATION DATE:  18 March 2022 REFERRING MD: Tilman Neat Amin,MD CHIEF COMPLAINT: Evaluate abnormality noted on CT neck, query posterior tracheal mass (subglottic)  History of Present Illness:  [name] is a 88 year old female, lifelong never smoker with a medical history as noted below who was admitted to [facility] on 22 October with a recurrent UTI and severe sepsis.  Patient has had an acute encephalopathy superimposed on advanced dementia.  Prior to admission she had 3 falls at her assisted living facility, cervical spine films showed an apparent polypoid soft tissue mass along the posterior aspect of the trachea right at the subglottic area.  The patient's son who is at bedside, the patient has been asymptomatic in this regard with regards to stridor, shortness of breath, wheezing, cough, dysphagia, or pain.  The patient cannot give history today due to having had episode of agitation and required sedative medication.  She is currently sleeping.  She had evaluation by ENT last evening I have reviewed their that consultation report.  I have independently reviewed the imaging performed as well as multiple C-spines done previously as far back as March 2023 and the same apparent lesion is evident with no change in size.  PCCM is consulted for potential bronchoscopy.  I discussed with the patient's son that the patient will require sedation for the bronchoscopy which could aggravate her encephalopathy further.  In addition if a lesion is found is going to be very high up in the trachea and potential complications from a biopsy would necessitate the patient to be intubated.  The patient's son is very adamant that he really does not want to have his mother to have any invasive procedures.  He is very realistic with regards to goals of care for the patient.  His only concern is that the patient currently is on  an assisted living facility and she is requiring more assistance she is becoming more dependent of care.  Pertinent  Medical History   Patient Active Problem List   Diagnosis Date Noted   Delirium 03/18/2022   Abnormal finding on imaging 03/17/2022   Sepsis secondary to UTI (HCC) 03/17/2022   Severe sepsis (HCC) 03/16/2022   HCAP (healthcare-associated pneumonia) 03/16/2022   Aspiration pneumonia (HCC) 03/16/2022   Fall 03/16/2022   Acute metabolic encephalopathy 03/16/2022   Thrombocytopenia (HCC) 03/16/2022   Prolonged QT interval 03/16/2022   Anxiety 03/16/2022   Chronic kidney disease, stage 3a (HCC) 03/16/2022   Acute hypoxemic respiratory failure (HCC) 12/20/2021   Essential hypertension 12/20/2021   Advanced dementia (HCC) 12/20/2021   Right hip pain 12/20/2021   UTI (urinary tract infection) 12/20/2021   Acute respiratory failure (HCC) 09/19/2021   Multifocal pneumonia 09/19/2021   GERD (gastroesophageal reflux disease) 03/24/2020    Interim History / Subjective:  Patient is currently sleeping quietly.  Only limited assessment possible.  Son requests that the patient not be woken up.  She is comfortable sleeping supine without distress.  Objective   Blood pressure (!) 134/90, pulse 100, temperature 98.4 F (36.9 C), temperature source Oral, resp. rate 18, height 5\' 5"  (1.651 m), weight 59 kg, SpO2 94 %.        Intake/Output Summary (Last 24 hours) at 03/18/2022 1336 Last data filed at 03/18/2022 0523 Gross per 24 hour  Intake 2672.22 ml  Output --  Net 2672.22 ml   Filed Weights   03/16/22 1342  Weight: 59  kg    Examination: Limited GENERAL: Frail, elderly woman, sleeping quietly, no distress. HEAD: Normocephalic, atraumatic.  NECK: Supple. No thyromegaly. Trachea midline. No JVD.  No adenopathy.  No stridor. PULMONARY: Good air entry bilaterally.  No adventitious sounds. CARDIOVASCULAR: S1 and S2. Regular rate and rhythm.  ABDOMEN:  Benign. MUSCULOSKELETAL: No joint deformity, no clubbing, no edema.  NEUROLOGIC: Patient sleeping, not assessed. SKIN: Intact,warm,dry. PSYCH: Cannot assess.  Imaging    Films independently reviewed, apparent lesion has been noted to as far back as March 2023 as noted above.  Assessment & Plan:  Apparent lesion in the immediate subglottic area posterior tracheal wall Query benign process given stability Cannot exclude potential esophageal lesion protruding into the trachea No change since March 2023 Patient has been asymptomatic High risk for invasive procedures, family declines invasive procedures Family wishes strictly noninvasive procedures Proceed with barium swallow to exclude potential esophageal abnormality   Overall prognosis is guarded given patient's advanced age, frailty and advanced dementia.  Recommend evaluation by Palliative Care to follow patient as outpatient.  Patient's family has realistic goals of care for patient.  Should her situation deteriorate they would transition to comfort measures.  Labs   CBC: Recent Labs  Lab 03/16/22 1355 03/17/22 0440  WBC 7.0 6.7  NEUTROABS 6.1  --   HGB 12.7 11.0*  HCT 40.9 36.6  MCV 87.4 90.4  PLT 120* 119*    Basic Metabolic Panel: Recent Labs  Lab 03/16/22 1355 03/17/22 0440 03/18/22 0637  NA 140 141  --   K 4.0 3.6  --   CL 109 113*  --   CO2 21* 21*  --   GLUCOSE 130* 79  --   BUN 34* 30*  --   CREATININE 1.21* 1.10* 1.15*  CALCIUM 9.5 8.4*  --   MG 2.0  --   --    GFR: Estimated Creatinine Clearance: 25.7 mL/min (A) (by C-G formula based on SCr of 1.15 mg/dL (H)). Recent Labs  Lab 03/16/22 1355 03/16/22 1356 03/16/22 1556 03/16/22 1730 03/17/22 0440 03/17/22 1103  PROCALCITON  --   --   --  3.65  --   --   WBC 7.0  --   --   --  6.7  --   LATICACIDVEN  --  2.9* 2.8*  --   --  1.4    Liver Function Tests: Recent Labs  Lab 03/16/22 1355  AST 48*  ALT 18  ALKPHOS 75  BILITOT 1.5*  PROT  6.4*  ALBUMIN 3.4*   No results for input(s): "LIPASE", "AMYLASE" in the last 168 hours. No results for input(s): "AMMONIA" in the last 168 hours.  ABG No results found for: "PHART", "PCO2ART", "PO2ART", "HCO3", "TCO2", "ACIDBASEDEF", "O2SAT"   Coagulation Profile: Recent Labs  Lab 03/16/22 1355  INR 1.2    Cardiac Enzymes: No results for input(s): "CKTOTAL", "CKMB", "CKMBINDEX", "TROPONINI" in the last 168 hours.  HbA1C: No results found for: "HGBA1C"  CBG: Recent Labs  Lab 03/17/22 1050 03/18/22 0839  GLUCAP 103* 94    Review of Systems:   Unable to obtain review of systems from the patient.  Currently sleeping after sedatives given for agitation.  Per patient's son she has not had any issues with hoarseness, shortness of breath, cough or sputum production.  No swallowing difficulty.  Past Medical History:  She,  has a past medical history of Diverticulosis and History of Clostridium difficile infection.   Surgical History:   Past Surgical History:  Procedure Laterality Date   ABDOMINAL HYSTERECTOMY     ANTERIOR APPROACH HEMI HIP ARTHROPLASTY Right 12/21/2021   Procedure: ANTERIOR APPROACH HEMI HIP ARTHROPLASTY;  Surgeon: Kennedy Bucker, MD;  Location: [facility] ORS;  Service: Orthopedics;  Laterality: Right;   CHOLECYSTECTOMY     HIP ARTHROPLASTY Left 03/25/2020   Procedure: ARTHROPLASTY BIPOLAR HIP (HEMIARTHROPLASTY);  Surgeon: Kennedy Bucker, MD;  Location: [facility] ORS;  Service: Orthopedics;  Laterality: Left;     Social History:   reports that she has never smoked. She has never used smokeless tobacco. She reports that she does not drink alcohol and does not use drugs.   Family History:  Her family history is not on file.   Allergies Allergies  Allergen Reactions   Codeine    Iodine    Macrobid [Nitrofurantoin] Other (See Comments)    Unknown reaction.   Ciprofloxacin Rash    Other reaction(s): whelps     Home Medications  Prior to Admission medications    Medication Sig Start Date End Date Taking? Authorizing Provider  acetaminophen (TYLENOL) 325 MG tablet Take 2 tablets by mouth in the morning, at noon, and at bedtime. 12/03/21  Yes [provider]  ALPRAZolam Prudy Feeler) 0.5 MG tablet Take 0.25 mg by mouth at bedtime. At 5pm 02/20/22  Yes [provider]  aspirin 81 MG EC tablet Chew 81 mg by mouth daily.   Yes [provider]  polyethylene glycol (MIRALAX / GLYCOLAX) 17 g packet Take 17 g by mouth 2 (two) times daily. 03/27/20  Yes Darlin Priestly, MD  senna-docusate (SENOKOT-S) 8.6-50 MG tablet Take 2 tablets by mouth 2 (two) times daily.   Yes [provider]  ALPRAZolam (XANAX) 0.25 MG tablet Take 0.25 mg by mouth 2 (two) times daily as needed for anxiety. Patient not taking: Reported on 03/16/2022    [provider]  amLODipine (NORVASC) 5 MG tablet Take 5 mg by mouth daily. Patient not taking: Reported on 03/16/2022 11/26/21   [provider]  Cholecalciferol (VITAMIN D3) 50 MCG (2000 UT) TABS Take 1 capsule by mouth daily. Wednesday 03/22/20   [provider]  enoxaparin (LOVENOX) 30 MG/0.3ML injection Inject 0.3 mLs (30 mg total) into the skin daily for 14 days. 12/24/21 01/07/22  Arnetha Courser, MD  feeding supplement (ENSURE ENLIVE / ENSURE PLUS) LIQD Take 237 mLs by mouth 2 (two) times daily between meals. 03/27/20   Darlin Priestly, MD  guaifenesin (HUMIBID E) 400 MG TABS tablet Take 400 mg by mouth every 4 (four) hours as needed.    [provider]  HYDROcodone-acetaminophen (NORCO/VICODIN) 5-325 MG tablet Take 1 tablet by mouth every 4 (four) hours as needed for moderate pain or severe pain. 12/24/21   Arnetha Courser, MD  magnesium hydroxide (MILK OF MAGNESIA) 400 MG/5ML suspension Take 30 mLs by mouth daily as needed for mild constipation or moderate constipation. 12/24/21   Arnetha Courser, MD  methocarbamol (ROBAXIN) 500 MG tablet Take 1 tablet (500 mg total) by mouth every 6 (six) hours as  needed for muscle spasms. Patient not taking: Reported on 03/16/2022 12/24/21   Arnetha Courser, MD  ondansetron (ZOFRAN) 4 MG tablet Take 4 mg by mouth every 6 (six) hours as needed for nausea or vomiting.    [provider]  risperiDONE (RISPERDAL) 0.25 MG tablet Take 1 tablet by mouth 2 (two) times daily. 12/03/21 01/02/22  [provider]    Scheduled Meds:  ALPRAZolam  0.25 mg Oral Q supper   amLODipine  5 mg Oral Daily   aspirin EC  81 mg Oral Daily   enoxaparin (LOVENOX) injection  30 mg Subcutaneous Q24H   polyethylene glycol  17 g Oral BID   QUEtiapine  25 mg Oral QHS   senna-docusate  2 tablet Oral BID   Continuous Infusions:  sodium chloride 75 mL/hr at 03/18/22 1243   cefTRIAXone (ROCEPHIN)  IV 2 g (03/18/22 1023)   metronidazole 500 mg (03/18/22 1016)   PRN Meds:.acetaminophen, acetaminophen, albuterol, dextromethorphan-guaiFENesin, diphenhydrAMINE, hydrALAZINE, HYDROcodone-acetaminophen, magnesium hydroxide  Level 3 consult    Thank you for allowing [facility] participate in your patient's care.  Will not actively follow however due to reconsult if needed.   Renold Don, MD Advanced Bronchoscopy PCCM [facility]    *This note was dictated using voice recognition software/Dragon.  Despite best efforts to proofread, errors can occur which can change the meaning. Any transcriptional errors that result from this process are unintentional and may not be fully corrected at the time of dictation.

## 2022-03-18 NOTE — Progress Notes (Signed)
Progress Note   Patient: Faith Vasquez [id number]:[id number] DOB: Aug 24, 1922 DOA: 03/16/2022     2 DOS: the patient was seen and examined on 03/18/2022   Brief hospital course: Taken from H&P.  Layce Sprung is a 88 y.o. female with medical history significant of hypertension, GERD, anxiety, C. difficile colitis, advanced dementia, CKD-3a,  who presents with fever, chills, weakness, fall, worsening mental status. Patient has underlying advanced dementia and lives in a memory care unit.  When family saw her yesterday they immediately called the ambulance.  Apparently had multiple choking events while eating food.  Patient also fell 3 times last night and had bruising to her left knee, right elbow and right leg.  Daughter-in-law was concerned about some dysuria but patient was unable to explain any symptoms due to advanced dementia.  Data reviewed independently and ED Course: pt was found to have WBC 7.0, lactic acid 2.9, INR 1.2, PTT 31, positive urinalysis for UTI (hazy appearance, moderate amount of leukocyte, many bacteria, WBC> 50), slightly worsening renal function.  Temperature 103.5, blood pressure 151/72, heart rate 125, RR 22, oxygen saturation 89% on room air, which improved to 95% on 2 L oxygen. X-ray of left humerus, pelvis, right tibia/fibula negative for bony fracture.  Patient was started on broad-spectrum antibiotics with vancomycin, cefepime and Flagyl for concern of sepsis.  Chest x-ray: Increased hazy opacity along the right heart border is worrisome for infection. Apparent interval increase in rightward deviation of the trachea may be due to positioning. If there is clinical concern for mediastinal pathology, further evaluation with CT chest is recommended.   CT-head and neck: 1. No acute intracranial abnormality. Sequela of chronic microvascular ischemic change. 2. No acute cervical spine fracture. 3. Apparent polypoid soft tissue mass along the posterior aspect of the  trachea. Recommend direct visualization.     EKG: I also personally reviewed.  Sinus rhythm, QTc 566, LAE, LAD, poor R wave progression, ST depression in lateral leads.  10/23: Preliminary blood cultures with E. Coli.  Antibiotics switched to ceftriaxone and Flagyl.  Procalcitonin at 3.65 1/4 blood culture bottles with gram-positive cocci, Staphylococcus species.  Vancomycin was added back and we will repeat blood cultures. Pulmonary was also consulted for the concerning soft tissue mass behind the trachea, pulmonary is asking ENT consult as it appears to be in glottis/epiglottis area.  Message sent to Dr. Kathyrn Sheriff from ENT.  10/24: Patient remained afebrile over the past 24 hours.  Overnight intermittent agitation most likely secondary to delirium with advanced dementia.  Laryngoscopy done by ENT with no obvious abnormalities, mass appeared to be below the larynx and either originating from trachea or esophagus.  Patient is asymptomatic.  ENT is recommending barium swallow and if that is negative then bronchoscopy if family wants to pursue the diagnosis. Discussed with son at bedside and they do not want to be very aggressive which is very appropriate.  Patient is almost 89 with advanced dementia. Palliative care was also consulted. Repeat blood cultures pending, urine cultures with E. coli which shows resistance to ampicillin, ampicillin/sulbactam and Zosyn.  Initial blood cultures with E. coli and Staphylococcus hominis in 1 bottle only-most likely a contaminant.  Vancomycin was discontinued.   Assessment and Plan: * Severe sepsis [facility]) Patient met severe sepsis criteria, concern of UTI and aspiration pneumonia. Blood and urine cultures with E. coli and 1 out of 4 with staphylococcal species, which is most likely a contaminant.  Repeat cultures pending  Procalcitonin at 3.65. Multiple choking events,  per daughter-in-law patient eats too fast and ended up choking.  Patient received cefepime,  Flagyl and vancomycin initially.  Later switched to ceftriaxone, Flagyl and vancomycin.  We will discontinue vancomycin today. -Continue with ceftriaxone and Flagyl -Swallow evaluation  HCAP (healthcare-associated pneumonia) Concern of aspiration pneumonia.  Currently on 2 L of oxygen with procalcitonin at 3.65. -Swallow evaluation -Ceftriaxone and Flagyl -Strep pneumo negative and Legionella pending  Aspiration pneumonia ([place]) - See above  UTI (urinary tract infection) Unable to explain any urinary symptoms due to advanced dementia. - See above  Advanced dementia [facility]) Patient is from a memory care unit. --fall precaution -Delirium precautions  Acute metabolic encephalopathy Improved.  Currently at baseline. Most likely secondary to sepsis. High risk for delirium due to history of advanced dementia  Essential hypertension Currently blood pressure within goal, intermittently elevated.  Home amlodipine was initially held for concern of blood pressure becoming soft with sepsis. -Restart home amlodipine -Continue to monitor  Chronic kidney disease, stage 3a ([place]) Did not met criteria for AKI but creatinine was mildly elevated from her baseline on admission which is now improving with IV fluid. -Monitor renal function -Avoid nephrotoxins  Fall Imaging negative for any acute injury PT/OT evaluation Supportive care  Thrombocytopenia (HCC) Mildly elevated LDH.  Seems chronic. -Continue to monitor  Anxiety - Continue home as needed Xanax  Prolonged QT interval Hold risperidone -Avoid using QT prolonging medications, such as Zofran. -As needed Benadryl for nausea vomiting  Delirium Patient is having delirium with intermittent agitation and becoming combative. Got 1 dose of 0.5 mg Ativan this morning as she was hitting and biting nursing staff.  Trying to avoid Haldol due to prolonged QTc. Patient is high risk for delirium with history of advanced dementia -Continue to  monitor -Delirium precautions  Abnormal finding on imaging Chest x-ray showed interval increase in rightward deviation of the trachea. CT of C spin showed apparent polypoid soft tissue mass along the posterior aspect of the trachea. Recommend direct visualization. -ENT was consulted and laryngoscopy was without any obvious abnormality until above the vocal cord and they were recommending this mass might be originating either from trachea or esophagus. -Barium swallow studies ordered -Will be high risk for bronchoscopy-family does not want very aggressive diagnostic measures.    Subjective: Patient was sleeping comfortably when seen during morning rounds.  She received low-dose Ativan earlier after becoming very combative.  Son at bedside.  Physical Exam: Vitals:   03/17/22 1633 03/17/22 2029 03/18/22 0644 03/18/22 0725  BP: (!) 158/71 (!) 163/78  (!) 134/90  Pulse: 82 94 64 100  Resp: _0 Temp: 97.6 F (36.4 C) 97.6 F (36.4 C) 98 F (36.7 C) 98.4 F (36.9 C)  TempSrc: Oral Oral  Oral  SpO2: 95% 96% 100% 94%  Weight:      Height:       General.  Frail elderly lady, in no acute distress. Pulmonary.  Lungs clear bilaterally, normal respiratory effort. CV.  Regular rate and rhythm, no JVD, rub or murmur. Abdomen.  Soft, nontender, nondistended, BS positive. CNS.  Somnolent, no apparent focal deficit Extremities.  No edema, no cyanosis, pulses intact and symmetrical. Psychiatry.  Judgment and insight appears impaired  Data Reviewed: Prior data reviewed  Family Communication: Discussed with son at bedside  Disposition: Status is: Inpatient Remains inpatient appropriate because: Severity of illness   Planned Discharge Destination: Skilled nursing facility  DVT prophylaxis.  Lovenox Time spent: 45 minutes  This record  has been created using Systems analyst. Errors have been sought and corrected,but may not always be located. Such creation errors  do not reflect on the standard of care.  Author: Lorella Nimrod, MD 03/18/2022 12:56 PM  For on call review [email or web address].

## 2022-03-19 DIAGNOSIS — F03C11 Unspecified dementia, severe, with agitation: Secondary | ICD-10-CM | POA: Diagnosis not present

## 2022-03-19 DIAGNOSIS — W19XXXA Unspecified fall, initial encounter: Secondary | ICD-10-CM | POA: Diagnosis not present

## 2022-03-19 DIAGNOSIS — A419 Sepsis, unspecified organism: Secondary | ICD-10-CM | POA: Diagnosis not present

## 2022-03-19 DIAGNOSIS — Z66 Do not resuscitate: Secondary | ICD-10-CM

## 2022-03-19 DIAGNOSIS — Z515 Encounter for palliative care: Secondary | ICD-10-CM

## 2022-03-19 DIAGNOSIS — G9341 Metabolic encephalopathy: Secondary | ICD-10-CM | POA: Diagnosis not present

## 2022-03-19 DIAGNOSIS — R6520 Severe sepsis without septic shock: Secondary | ICD-10-CM | POA: Diagnosis not present

## 2022-03-19 LAB — CULTURE, BLOOD (ROUTINE X 2): Special Requests: ADEQUATE

## 2022-03-19 LAB — BASIC METABOLIC PANEL
Anion gap: 6 (ref 5–15)
BUN: 23 mg/dL (ref 8–23)
CO2: 22 mmol/L (ref 22–32)
Calcium: 9 mg/dL (ref 8.9–10.3)
Chloride: 115 mmol/L — ABNORMAL HIGH (ref 98–111)
Creatinine, Ser: 1.07 mg/dL — ABNORMAL HIGH (ref 0.44–1.00)
GFR, Estimated: 48 mL/min — ABNORMAL LOW (ref >60)
Glucose, Bld: 94 mg/dL (ref 70–99)
Potassium: 3.4 mmol/L — ABNORMAL LOW (ref 3.5–5.1)
Sodium: 143 mmol/L (ref 135–145)

## 2022-03-19 LAB — CBC
HCT: 35.1 % — ABNORMAL LOW (ref 36.0–46.0)
Hemoglobin: 11.2 g/dL — ABNORMAL LOW (ref 12.0–15.0)
MCH: 27.2 pg (ref 26.0–34.0)
MCHC: 31.9 g/dL (ref 30.0–36.0)
MCV: 85.2 fL (ref 80.0–100.0)
Platelets: 143 10*3/uL — ABNORMAL LOW (ref 150–400)
RBC: 4.12 MIL/uL (ref 3.87–5.11)
RDW: 15.6 % — ABNORMAL HIGH (ref 11.5–15.5)
WBC: 5 10*3/uL (ref 4.0–10.5)
nRBC: 0 % (ref 0.0–0.2)

## 2022-03-19 MED ORDER — CEFUROXIME AXETIL 500 MG PO TABS: 500.0000 mg | ORAL_TABLET | Freq: Two times a day (BID) | ORAL | 0 refills | Status: DC

## 2022-03-19 MED ORDER — CEFUROXIME AXETIL 500 MG PO TABS: 500.0000 mg | ORAL_TABLET | Freq: Two times a day (BID) | ORAL | Status: DC

## 2022-03-19 MED ORDER — CEFUROXIME AXETIL 500 MG PO TABS: 500.0000 mg | ORAL_TABLET | Freq: Two times a day (BID) | ORAL | 0 refills | Status: AC

## 2022-03-19 NOTE — NC FL2 (Signed)
[place] MEDICAID FL2 LEVEL OF CARE SCREENING TOOL     IDENTIFICATION  Patient Name: Faith Vasquez Birthdate: 1922/08/28 Sex: female Admission Date (Current Location): 03/16/2022  [facility] and [place] Number:  Chiropodist and Address:         Provider Number: [id number]  Attending Physician Name and Address:  Arnetha Courser, MD  Relative Name and Phone Number:       Current Level of Care: Hospital Recommended Level of Care: Memory Care Prior Approval Number:    Date Approved/Denied:   PASRR Number:    Discharge Plan: Other (Comment) (Memory Care)    Current Diagnoses: Patient Active Problem List   Diagnosis Date Noted   Delirium 03/18/2022   Abnormal finding on imaging 03/17/2022   Sepsis secondary to UTI (HCC) 03/17/2022   Severe sepsis (HCC) 03/16/2022   HCAP (healthcare-associated pneumonia) 03/16/2022   Aspiration pneumonia (HCC) 03/16/2022   Fall 03/16/2022   Acute metabolic encephalopathy 03/16/2022   Thrombocytopenia (HCC) 03/16/2022   Prolonged QT interval 03/16/2022   Anxiety 03/16/2022   Chronic kidney disease, stage 3a (HCC) 03/16/2022   Acute hypoxemic respiratory failure (HCC) 12/20/2021   Essential hypertension 12/20/2021   Advanced dementia (HCC) 12/20/2021   Right hip pain 12/20/2021   UTI (urinary tract infection) 12/20/2021   Acute respiratory failure (HCC) 09/19/2021   Multifocal pneumonia 09/19/2021   GERD (gastroesophageal reflux disease) 03/24/2020    Orientation RESPIRATION BLADDER Height & Weight     Self  Normal Incontinent Weight: 59 kg Height:  5\' 5"  (165.1 cm)  BEHAVIORAL SYMPTOMS/MOOD NEUROLOGICAL BOWEL NUTRITION STATUS      Continent Diet (Chopped)  AMBULATORY STATUS COMMUNICATION OF NEEDS Skin   Limited Assist Verbally Bruising                       Personal Care Assistance Level of Assistance              Functional Limitations Info             SPECIAL CARE FACTORS FREQUENCY                        Contractures Contractures Info: Not present    Additional Factors Info  Code Status, Allergies (Outpatient palliative) Code Status Info: DNR Allergies Info: Codeine, Iodine, Macrobid (Nitrofurantoin), Ciprofloxacin           Medication List       STOP taking these medications     enoxaparin 30 MG/0.3ML injection Commonly known as: LOVENOX    methocarbamol 500 MG tablet Commonly known as: ROBAXIN           TAKE these medications     acetaminophen 325 MG tablet Commonly known as: TYLENOL Take 2 tablets by mouth in the morning, at noon, and at bedtime.    ALPRAZolam 0.5 MG tablet Commonly known as: XANAX Take 0.25 mg by mouth at bedtime. At 5pm What changed: Another medication with the same name was removed. Continue taking this medication, and follow the directions you see here.    amLODipine 5 MG tablet Commonly known as: NORVASC Take 5 mg by mouth daily.    aspirin EC 81 MG tablet Chew 81 mg by mouth daily.    cefUROXime 500 MG tablet Commonly known as: CEFTIN Take 1 tablet (500 mg total) by mouth 2 (two) times daily with a meal for 6 days. Start taking on: March 20, 2022    feeding  supplement Liqd Take 237 mLs by mouth 2 (two) times daily between meals.    guaifenesin 400 MG Tabs tablet Commonly known as: HUMIBID E Take 400 mg by mouth every 4 (four) hours as needed.    HYDROcodone-acetaminophen 5-325 MG tablet Commonly known as: NORCO/VICODIN Take 1 tablet by mouth every 4 (four) hours as needed for moderate pain or severe pain.    magnesium hydroxide 400 MG/5ML suspension Commonly known as: MILK OF MAGNESIA Take 30 mLs by mouth daily as needed for mild constipation or moderate constipation.    ondansetron 4 MG tablet Commonly known as: ZOFRAN Take 4 mg by mouth every 6 (six) hours as needed for nausea or vomiting.    polyethylene glycol 17 g packet Commonly known as: MIRALAX / GLYCOLAX Take 17 g by mouth 2 (two) times  daily.    risperiDONE 0.25 MG tablet Commonly known as: RISPERDAL Take 1 tablet by mouth 2 (two) times daily.    senna-docusate 8.6-50 MG tablet Commonly known as: Senokot-S Take 2 tablets by mouth 2 (two) times daily.    Vitamin D3 50 MCG (2000 UT) Tabs Take 1 capsule by mouth daily. Wednesday     Relevant Imaging Results:  Relevant Lab Results:   Additional Information SS# [id number]  Beverly Sessions, RN

## 2022-03-19 NOTE — Consult Note (Signed)
Consultation Note Date: 03/19/2022   Patient Name: Faith Vasquez  DOB: 09/20/1922  MRN: [id number]  Age / Sex: 88 y.o., female  PCP: Gladstone Lighter, MD Referring Physician: Lorella Nimrod, MD  Reason for Consultation: Establishing goals of care  HPI/Patient Profile: 88 y.o. female  with past medical history of advanced dementia, multiple falls, HTN, GERD, anxiety, and CKD (stage IIIa) admitted on 03/16/2022 with fever, chills, and worsening mental status.  Patient is being treated for a UTI as well as blood cultures positive for E. coli.  PMT was consulted to discuss goals of care.   Clinical Assessment and Goals of Care: I have reviewed medical records including EPIC notes, labs and imaging, assessed the patient (unable to participate in [place] discussions independently due to dementia) and then spoke with patient's son Louie Casa and his wife Opal Sidles over the phone to discuss diagnosis prognosis, [place], EOL wishes, disposition and options.  I introduced Palliative Medicine as specialized medical care for people living with serious illness. It focuses on providing relief from the symptoms and stress of a serious illness. The goal is to improve quality of life for both the patient and the family.  We discussed a brief life review of the patient.  Family describes the patient as strong-willed and enjoys having attention focused on her. They share her aggressive behavior in the ED during this admission was out of character for her but that she is a strong willed and independent woman at baseline (compounded by dementia and delirium).  We discussed patient's current illness and what it means in the larger context of patient's on-going co-morbidities.  Education provided on dementia as a progressive, chronic, and irreversible disease.    I attempted to elicit values and goals of care important to the patient.  Family  shares they are prepared for patient to return to her memory care unit.  They would like to avoid use of narcotics but would want to have her agitation and anxiety better controlled with medications.  Education provided on the use of Seroquel and other non-narcotic medications to more consistently manage patient's agitation/anxiety.  Reviewed that minimizing agitation and distress would be within patient/family's goals of care.  Family was in agreement to have outpatient palliative services to follow patient at discharge.  Reviewed that given patient's advanced age and comorbidities that she may be appropriate for hospice care in the near future.  Hospice philosophy briefly discussed.  Family appreciative of recommendations.   Discussed with patient/family the importance of continued conversation with family and the medical providers regarding overall plan of care and treatment options, ensuring decisions are within the context of the patient's values and GOCs.    Questions and concerns were addressed. The family was encouraged to call with questions or concerns.   Plan for d/c today with outpatient palliative to follow.   Recommendations at discharge: -Seroquel 25mg  PO at 6-7pm - just after patient's dinner - adjust/titrate up QHS and can be given in small doses during the day if appropriate -Continue 0.25mg  Xanax  PRN -Avoid opioids -Use Tylenol PO for pain management  Primary Decision Maker NEXT OF KIN  Physical Exam Vitals reviewed.  Constitutional:      General: She is not in acute distress.    Appearance: Normal appearance. She is normal weight. She is not toxic-appearing.  HENT:     Head: Normocephalic.     Mouth/Throat:     Mouth: Mucous membranes are moist.  Eyes:     Pupils: Pupils are equal, round, and reactive to light.  Cardiovascular:     Rate and Rhythm: Normal rate.     Pulses: Normal pulses.  Pulmonary:     Effort: Pulmonary effort is normal.  Abdominal:      Palpations: Abdomen is soft.  Musculoskeletal:        General: Normal range of motion.  Skin:    General: Skin is warm and dry.  Neurological:     Mental Status: She is alert.     Comments: Oriented to self     Palliative Assessment/Data: 50%     Thank you for this consult. Palliative medicine will continue to follow and assist holistically.   Time Total: 75 minutes Greater than 50%  of this time was spent counseling and coordinating care related to the above assessment and plan.  Signed by: Georgiann Cocker, DNP, FNP-BC Palliative Medicine    Please contact Palliative Medicine Team phone at [phone] for questions and concerns.  For individual provider: See Loretha Stapler

## 2022-03-19 NOTE — Progress Notes (Signed)
[facility]) Hospital Liaison note:  Notified by Kathyrn Drown of request for [place] services. Will continue to follow for disposition.  Please call with any outpatient palliative questions or concerns.  Thank you for the opportunity to participate in this patient's care.  Thank you, Lorelee Market, LPN [facility] Liaison [phone]

## 2022-03-19 NOTE — Discharge Summary (Addendum)
Physician Discharge Summary   Patient: Faith Vasquez MRN: [id number] DOB: 1922/09/09  Admit date:     03/16/2022  Discharge date: 03/19/22  Discharge Physician: Lorella Nimrod   PCP: Gladstone Lighter, MD   Recommendations at discharge:  Please obtain CBC and BMP in 1 week Please ensure the completion of antibiotics for E. coli bacteremia.  Discharge Diagnoses: Principal Problem:   Severe sepsis ([place]) Active Problems:   HCAP (healthcare-associated pneumonia)   Aspiration pneumonia ([place])   UTI (urinary tract infection)   Advanced dementia ([place])   Acute metabolic encephalopathy   Essential hypertension   Chronic kidney disease, stage 3a ([place])   Fall   Thrombocytopenia (HCC)   Anxiety   Prolonged QT interval   Abnormal finding on imaging   Sepsis secondary to UTI [facility])   Delirium   Hospital Course: Taken from H&P.  Faith Vasquez is a 88 y.o. female with medical history significant of hypertension, GERD, anxiety, C. difficile colitis, advanced dementia, CKD-3a,  who presents with fever, chills, weakness, fall, worsening mental status. Patient has underlying advanced dementia and lives in a memory care unit.  When family saw her yesterday they immediately called the ambulance.  Apparently had multiple choking events while eating food.  Patient also fell 3 times last night and had bruising to her left knee, right elbow and right leg.  Daughter-in-law was concerned about some dysuria but patient was unable to explain any symptoms due to advanced dementia.  Data reviewed independently and ED Course: pt was found to have WBC 7.0, lactic acid 2.9, INR 1.2, PTT 31, positive urinalysis for UTI (hazy appearance, moderate amount of leukocyte, many bacteria, WBC> 50), slightly worsening renal function.  Temperature 103.5, blood pressure 151/72, heart rate 125, RR 22, oxygen saturation 89% on room air, which improved to 95% on 2 L oxygen. X-ray of left humerus, pelvis, right  tibia/fibula negative for bony fracture.  Patient was started on broad-spectrum antibiotics with vancomycin, cefepime and Flagyl for concern of sepsis.  Chest x-ray: Increased hazy opacity along the right heart border is worrisome for infection. Apparent interval increase in rightward deviation of the trachea may be due to positioning. If there is clinical concern for mediastinal pathology, further evaluation with CT chest is recommended.   CT-head and neck: 1. No acute intracranial abnormality. Sequela of chronic microvascular ischemic change. 2. No acute cervical spine fracture. 3. Apparent polypoid soft tissue mass along the posterior aspect of the trachea. Recommend direct visualization.     EKG: I also personally reviewed.  Sinus rhythm, QTc 566, LAE, LAD, poor R wave progression, ST depression in lateral leads.  10/23: Preliminary blood cultures with E. Coli.  Antibiotics switched to ceftriaxone and Flagyl.  Procalcitonin at 3.65 1/4 blood culture bottles with gram-positive cocci, Staphylococcus species.  Vancomycin was added back and we will repeat blood cultures. Pulmonary was also consulted for the concerning soft tissue mass behind the trachea, pulmonary is asking ENT consult as it appears to be in glottis/epiglottis area.  Message sent to Dr. Kathyrn Sheriff from ENT.  10/24: Patient remained afebrile over the past 24 hours.  Overnight intermittent agitation most likely secondary to delirium with advanced dementia.  Laryngoscopy done by ENT with no obvious abnormalities, mass appeared to be below the larynx and either originating from trachea or esophagus.  Patient is asymptomatic.  ENT is recommending barium swallow and if that is negative then bronchoscopy if family wants to pursue the diagnosis. Discussed with son at bedside and they do  not want to be very aggressive which is very appropriate.  Patient is almost 53 with advanced dementia. Palliative care was also consulted. Repeat  blood cultures pending, urine cultures with E. coli which shows resistance to ampicillin, ampicillin/sulbactam and Zosyn.  Initial blood cultures with E. coli and Staphylococcus hominis in 1 bottle only-most likely a contaminant.  Vancomycin was discontinued.  10/25: Remained afebrile.  Barium swallow with severe esophageal dysmotility and a small sliding type hiatal hernia, no obvious esophageal mass or stricture identified. Family does not want any invasive measures for diagnosis.  Repeat blood cultures negative. Palliative care was also consulted and she will continue with outpatient palliative care. Patient is being discharged on 6 more days of Ceftin to complete a total of 10-day course.  Patient will continue on her current medications and need to have a close follow-up with her providers for further recommendations.  Assessment and Plan: * Severe sepsis [facility]) Patient met severe sepsis criteria, concern of UTI and aspiration pneumonia. Blood and urine cultures with E. coli and 1 out of 4 with staphylococcal species, which is most likely a contaminant.  Repeat cultures pending  Procalcitonin at 3.65. Multiple choking events, per daughter-in-law patient eats too fast and ended up choking.  Patient received cefepime, Flagyl and vancomycin initially.  Later switched to ceftriaxone, Flagyl and vancomycin.  We will discontinue vancomycin today. -Continue with ceftriaxone and Flagyl -Swallow evaluation  HCAP (healthcare-associated pneumonia) Concern of aspiration pneumonia.  Currently on 2 L of oxygen with procalcitonin at 3.65. -Swallow evaluation -Ceftriaxone and Flagyl -Strep pneumo negative and Legionella pending  Aspiration pneumonia ([place]) - See above  UTI (urinary tract infection) Unable to explain any urinary symptoms due to advanced dementia. - See above  Advanced dementia [facility]) Patient is from a memory care unit. --fall precaution -Delirium precautions  Acute metabolic  encephalopathy Improved.  Currently at baseline. Most likely secondary to sepsis. High risk for delirium due to history of advanced dementia  Essential hypertension Currently blood pressure within goal, intermittently elevated.  Home amlodipine was initially held for concern of blood pressure becoming soft with sepsis. -Restart home amlodipine -Continue to monitor  Chronic kidney disease, stage 3a ([place]) Did not met criteria for AKI but creatinine was mildly elevated from her baseline on admission which is now improving with IV fluid. -Monitor renal function -Avoid nephrotoxins  Fall Imaging negative for any acute injury PT/OT evaluation Supportive care  Thrombocytopenia (HCC) Mildly elevated LDH.  Seems chronic. -Continue to monitor  Anxiety - Continue home as needed Xanax  Prolonged QT interval Hold risperidone -Avoid using QT prolonging medications, such as Zofran. -As needed Benadryl for nausea vomiting  Delirium Patient is having delirium with intermittent agitation and becoming combative. Got 1 dose of 0.5 mg Ativan this morning as she was hitting and biting nursing staff.  Trying to avoid Haldol due to prolonged QTc. Patient is high risk for delirium with history of advanced dementia -Continue to monitor -Delirium precautions  Abnormal finding on imaging Chest x-ray showed interval increase in rightward deviation of the trachea. CT of C spin showed apparent polypoid soft tissue mass along the posterior aspect of the trachea. Recommend direct visualization. -ENT was consulted and laryngoscopy was without any obvious abnormality until above the vocal cord and they were recommending this mass might be originating either from trachea or esophagus. -Barium swallow studies ordered -Will be high risk for bronchoscopy-family does not want very aggressive diagnostic measures.   Pain control - [facility] Controlled Substance  Reporting System database was reviewed. and  patient was instructed, not to drive, operate heavy machinery, perform activities at heights, swimming or participation in water activities or provide baby-sitting services while on Pain, Sleep and Anxiety Medications; until their outpatient Physician has advised to do so again. Also recommended to not to take more than prescribed Pain, Sleep and Anxiety Medications.  Consultants: Palliative care, ENT, pulmonology Procedures performed: None Disposition: Skilled nursing facility Diet recommendation:  Discharge Diet Orders (From admission, onward)     Start     Ordered   03/19/22 0000  Diet - low sodium heart healthy        03/19/22 1338           Chopped diet DISCHARGE MEDICATION: Allergies as of 03/19/2022       Reactions   Codeine    Iodine    Macrobid [nitrofurantoin] Other (See Comments)   Unknown reaction.   Ciprofloxacin Rash   Other reaction(s): whelps        Medication List     STOP taking these medications    enoxaparin 30 MG/0.3ML injection Commonly known as: LOVENOX   methocarbamol 500 MG tablet Commonly known as: ROBAXIN       TAKE these medications    acetaminophen 325 MG tablet Commonly known as: TYLENOL Take 2 tablets by mouth in the morning, at noon, and at bedtime.   ALPRAZolam 0.5 MG tablet Commonly known as: XANAX Take 0.25 mg by mouth at bedtime. At 5pm What changed: Another medication with the same name was removed. Continue taking this medication, and follow the directions you see here.   amLODipine 5 MG tablet Commonly known as: NORVASC Take 5 mg by mouth daily.   aspirin EC 81 MG tablet Chew 81 mg by mouth daily.   cefUROXime 500 MG tablet Commonly known as: CEFTIN Take 1 tablet (500 mg total) by mouth 2 (two) times daily with a meal for 6 days. Start taking on: March 20, 2022   feeding supplement Liqd Take 237 mLs by mouth 2 (two) times daily between meals.   guaifenesin 400 MG Tabs tablet Commonly known as: HUMIBID  E Take 400 mg by mouth every 4 (four) hours as needed.   HYDROcodone-acetaminophen 5-325 MG tablet Commonly known as: NORCO/VICODIN Take 1 tablet by mouth every 4 (four) hours as needed for moderate pain or severe pain.   magnesium hydroxide 400 MG/5ML suspension Commonly known as: MILK OF MAGNESIA Take 30 mLs by mouth daily as needed for mild constipation or moderate constipation.   ondansetron 4 MG tablet Commonly known as: ZOFRAN Take 4 mg by mouth every 6 (six) hours as needed for nausea or vomiting.   polyethylene glycol 17 g packet Commonly known as: MIRALAX / GLYCOLAX Take 17 g by mouth 2 (two) times daily.   risperiDONE 0.25 MG tablet Commonly known as: RISPERDAL Take 1 tablet by mouth 2 (two) times daily.   senna-docusate 8.6-50 MG tablet Commonly known as: Senokot-S Take 2 tablets by mouth 2 (two) times daily.   Vitamin D3 50 MCG (2000 UT) Tabs Take 1 capsule by mouth daily. Wednesday        Follow-up Information     Gladstone Lighter, MD. Schedule an appointment as soon as possible for a visit in 1 week(s).   Specialty: Internal Medicine Contact information: [place] [address] [phone]                Discharge Exam: Danley Danker Weights  03/16/22 1342  Weight: 59 kg   General.  Frail elderly lady, in no acute distress. Pulmonary.  Lungs clear bilaterally, normal respiratory effort. CV.  Regular rate and rhythm, no JVD, rub or murmur. Abdomen.  Soft, nontender, nondistended, BS positive. CNS.  Alert and oriented to self only.  No focal neurologic deficit. Extremities.  No edema, no cyanosis, pulses intact and symmetrical. Psychiatry.  Judgment and insight appears impaired.  Condition at discharge: stable  The results of significant diagnostics from this hospitalization (including imaging, microbiology, ancillary and laboratory) are listed below for reference.   Imaging Studies: DG ESOPHAGUS W SINGLE CM (SOL OR THIN  BA)  Result Date: 03/18/2022 CLINICAL DATA:  Concern for mediastinal mass EXAM: ESOPHAGUS/BARIUM SWALLOW/TABLET STUDY TECHNIQUE: Single contrast examination was performed using thin liquid barium. This exam was performed by Reatha Armour, PA-C , and was supervised and interpreted by Van Clines, MD. FLUOROSCOPY: Radiation Exposure Index (as provided by the fluoroscopic device): 18.20 mGy [place] COMPARISON:  None Available. FINDINGS: Exam limited by limited mobility of patient, as well as inability of patient to tolerate oral contrast solution. Swallowing: Appears normal. No vestibular penetration or aspiration seen. Pharynx: Unremarkable. Esophagus: Normal appearance. Esophageal motility: Severe esophageal dysmotility with tertiary contractions and stasis of contrast material in esophagus. Primary peristaltic waves disrupted in the midthoracic esophagus. Hiatal Hernia: Small sliding-type hiatal hernia visualized Gastroesophageal reflux: None visualized. Ingested 54m barium tablet: None given Other: None. IMPRESSION: 1.  Severe esophageal dysmotility. 2.  Small sliding-type hiatal hernia. 3.  No esophageal mass or stricture identified. Electronically Signed   By: WVan ClinesM.D.   On: 03/18/2022 15:46   CT CHEST WO CONTRAST  Result Date: 03/17/2022 CLINICAL DATA:  Possible mediastinal mass. EXAM: CT CHEST WITHOUT CONTRAST TECHNIQUE: Multidetector CT imaging of the chest was performed following the standard protocol without IV contrast. RADIATION DOSE REDUCTION: This exam was performed according to the departmental dose-optimization program which includes automated exposure control, adjustment of the mA and/or kV according to patient size and/or use of iterative reconstruction technique. COMPARISON:  March 16, 2022.  September 19, 2021. FINDINGS: Cardiovascular: Atherosclerosis of thoracic aorta is noted without aneurysm formation. Normal cardiac size. No pericardial effusion. Mediastinum/Nodes:  Thyroid gland is unremarkable. Small sliding-type hiatal hernia is noted. Calcified left hilar lymph nodes are noted consistent with prior granulomatous disease. Evaluation of the mediastinum is limited due to the lack of intravenous contrast. 9 mm subcarinal lymph node is noted which may be reactive in etiology. 11 mm precarinal lymph node is noted as well as 7 mm anterior mediastinal lymph node Lungs/Pleura: No pneumothorax or pleural effusion is noted. Mild bilateral posterior basilar subsegmental atelectasis or scarring is noted. Upper Abdomen: No acute abnormality. Musculoskeletal: No chest wall mass or suspicious bone lesions identified. IMPRESSION: Mild bilateral posterior basilar subsegmental atelectasis or scarring is noted. Small sliding-type hiatal hernia is noted. Evaluation of the mediastinum is limited due to the lack of intravenous contrast. Multiple mildly enlarged lymph nodes are noted, the largest being 11 mm precarinal lymph node. These most likely are reactive in etiology. Calcified left hilar lymph nodes are noted most likely due to chronic granulomatous disease. Aortic Atherosclerosis (ICD10-I70.0). Electronically Signed   By: JMarijo ConceptionM.D.   On: 03/17/2022 15:32   DG Knee 1-2 Views Left  Result Date: 03/16/2022 CLINICAL DATA:  Fall, left knee pain EXAM: LEFT KNEE - 1-2 VIEW COMPARISON:  None Available. FINDINGS: Normal alignment. No acute fracture or dislocation. Mild patellofemoral and  moderate medial compartment degenerative arthritis. Degenerative chondrocalcinosis of the menisci. Trace left knee effusion. IMPRESSION: Trace left knee effusion. Bicompartmental degenerative arthritis. No acute fracture or dislocation. Electronically Signed   By: Fidela Salisbury M.D.   On: 03/16/2022 19:53   DG ELBOW COMPLETE RIGHT (3+VIEW)  Result Date: 03/16/2022 CLINICAL DATA:  Fall, right elbow pain EXAM: RIGHT ELBOW - COMPLETE 3+ VIEW COMPARISON:  None Available. FINDINGS: There is no  evidence of fracture, dislocation, or joint effusion. There is no evidence of arthropathy or other focal bone abnormality. Soft tissue swelling noted superficial to the olecranon and medial epicondyle. IMPRESSION: Soft tissue swelling. No fracture or dislocation. Electronically Signed   By: Fidela Salisbury M.D.   On: 03/16/2022 19:50   CT HEAD WO CONTRAST (5MM)  Result Date: 03/16/2022 CLINICAL DATA:  Weakness EXAM: CT HEAD WITHOUT CONTRAST CT CERVICAL SPINE WITHOUT CONTRAST TECHNIQUE: Multidetector CT imaging of the head and cervical spine was performed following the standard protocol without intravenous contrast. Multiplanar CT image reconstructions of the cervical spine were also generated. RADIATION DOSE REDUCTION: This exam was performed according to the departmental dose-optimization program which includes automated exposure control, adjustment of the mA and/or kV according to patient size and/or use of iterative reconstruction technique. COMPARISON:  None Available. FINDINGS: CT HEAD FINDINGS Brain: No evidence of acute infarction, hemorrhage, hydrocephalus, extra-axial collection or mass lesion/mass effect. Sequela of chronic microvascular ischemic change. Redemonstrated chronic left thalamic infarct. Vascular: No hyperdense vessel or unexpected calcification. Skull: Normal. Negative for fracture or focal lesion. Sinuses/Orbits: Bilateral lens replacement. Other: None. CT CERVICAL SPINE FINDINGS Alignment: Straightening of the normal cervical lordosis. There are degenerative changes at C1-C2 with subchondral cystic change and a degenerative pannus. There is grade 1 anterolisthesis of C2 on C3 and C3 on C4. Skull base and vertebrae: No acute fracture. No primary bone lesion or focal pathologic process. Soft tissues and spinal canal: No prevertebral fluid or swelling. No visible canal hematoma.There is an apparent polypoid soft tissue mass either within or possibly invading the airway (series 4, iamge 68).  Disc levels: There is multilevel degenerative disc with severe disc space loss in the midcervical spine. Upper chest: Negative. Other: None IMPRESSION: 1. No acute intracranial abnormality. Sequela of chronic microvascular ischemic change. 2. No acute cervical spine fracture. 3. Apparent polypoid soft tissue mass along the posterior aspect of the trachea. Recommend direct visualization. Electronically Signed   By: Marin Roberts M.D.   On: 03/16/2022 17:03   CT Cervical Spine Wo Contrast  Result Date: 03/16/2022 CLINICAL DATA:  Weakness EXAM: CT HEAD WITHOUT CONTRAST CT CERVICAL SPINE WITHOUT CONTRAST TECHNIQUE: Multidetector CT imaging of the head and cervical spine was performed following the standard protocol without intravenous contrast. Multiplanar CT image reconstructions of the cervical spine were also generated. RADIATION DOSE REDUCTION: This exam was performed according to the departmental dose-optimization program which includes automated exposure control, adjustment of the mA and/or kV according to patient size and/or use of iterative reconstruction technique. COMPARISON:  None Available. FINDINGS: CT HEAD FINDINGS Brain: No evidence of acute infarction, hemorrhage, hydrocephalus, extra-axial collection or mass lesion/mass effect. Sequela of chronic microvascular ischemic change. Redemonstrated chronic left thalamic infarct. Vascular: No hyperdense vessel or unexpected calcification. Skull: Normal. Negative for fracture or focal lesion. Sinuses/Orbits: Bilateral lens replacement. Other: None. CT CERVICAL SPINE FINDINGS Alignment: Straightening of the normal cervical lordosis. There are degenerative changes at C1-C2 with subchondral cystic change and a degenerative pannus. There is grade 1 anterolisthesis of C2 on  C3 and C3 on C4. Skull base and vertebrae: No acute fracture. No primary bone lesion or focal pathologic process. Soft tissues and spinal canal: No prevertebral fluid or swelling. No visible  canal hematoma.There is an apparent polypoid soft tissue mass either within or possibly invading the airway (series 4, iamge [id number]). Disc levels: There is multilevel degenerative disc with severe disc space loss in the midcervical spine. Upper chest: Negative. Other: None IMPRESSION: 1. No acute intracranial abnormality. Sequela of chronic microvascular ischemic change. 2. No acute cervical spine fracture. 3. Apparent polypoid soft tissue mass along the posterior aspect of the trachea. Recommend direct visualization. Electronically Signed   By: Marin Roberts M.D.   On: 03/16/2022 17:03   DG Tibia/Fibula Right  Result Date: 03/16/2022 CLINICAL DATA:  Trauma, fall EXAM: RIGHT TIBIA AND FIBULA - 2 VIEW COMPARISON:  None Available. FINDINGS: No fracture or dislocation is seen. Degenerative changes are noted in right knee with chondrocalcinosis. Plantar spur is seen in calcaneus. IMPRESSION: No fracture or dislocation is seen in right tibia and fibula. Degenerative changes are noted in right knee. Plantar spur is seen in right calcaneus. Electronically Signed   By: Elmer Picker M.D.   On: 03/16/2022 15:26   DG Humerus Left  Result Date: 03/16/2022 CLINICAL DATA:  Trauma, fall EXAM: LEFT HUMERUS - 2+ VIEW COMPARISON:  None Available. FINDINGS: There is no evidence of fracture or other focal bone lesions. Soft tissues are unremarkable. IMPRESSION: No fracture or dislocation is seen in left humerus. Electronically Signed   By: Elmer Picker M.D.   On: 03/16/2022 15:25   DG Pelvis 1-2 Views  Result Date: 03/16/2022 CLINICAL DATA:  Trauma, multiple falls EXAM: PELVIS - 1-2 VIEW COMPARISON:  None Available. FINDINGS: There is previous arthroplasty in both hips. No recent fracture or dislocation is seen. Severe degenerative changes are noted in visualized lower lumbar spine. Surgical clips are seen in right lower quadrant. IMPRESSION: Previous arthroplasty in both hips. No recent fracture or dislocation  is seen. Lumbar spondylosis. Electronically Signed   By: Elmer Picker M.D.   On: 03/16/2022 15:23   DG Chest Port 1 View  Result Date: 03/16/2022 CLINICAL DATA:  Possible sepsis EXAM: PORTABLE CHEST 1 VIEW COMPARISON:  Chest radiograph 12/20/21 FINDINGS: Pleural effusion. No pneumothorax. Low lung volumes. There is increased hazy opacity along the right heart border an apparent interval increase in rightward deviation of the trachea. Calcifications along the aortic arch appear unchanged. Visualized upper abdomen is unremarkable. IMPRESSION: Increased hazy opacity along the right heart border is worrisome for infection. Apparent interval increase in rightward deviation of the trachea may be due to positioning. If there is clinical concern for mediastinal pathology, further evaluation with CT chest is recommended. Electronically Signed   By: Marin Roberts M.D.   On: 03/16/2022 14:12    Microbiology: Results for orders placed or performed during the hospital encounter of 03/16/22  Blood Culture (routine x 2)     Status: Abnormal   Collection Time: 03/16/22  1:55 PM   Specimen: BLOOD  Result Value Ref Range Status   Specimen Description   Final    BLOOD RIGHT ANTECUBITAL Performed at [facility], [address] [place], [place] [address]    Special Requests   Final    BOTTLES DRAWN AEROBIC AND ANAEROBIC Blood Culture adequate volume Performed at [facility], [address], [place], [place] [address]    Culture  Setup Time   Final  GRAM NEGATIVE RODS AEROBIC BOTTLE ONLY CRITICAL RESULT CALLED TO, READ BACK BY AND VERIFIED WITH: NATHAN BELUE PHARMD [id number] 03/17/22 HNM GRAM POSITIVE COCCI ANAEROBIC BOTTLE ONLY CRITICAL RESULT CALLED TO, READ BACK BY AND VERIFIED WITH: Lu Duffel PHARMD Rosalia 03/17/22 HNM Performed at [facility], [address] [place], [place] [address]    Culture (A)  Final    ESCHERICHIA COLI STAPHYLOCOCCUS EPIDERMIDIS THE  SIGNIFICANCE OF ISOLATING THIS ORGANISM FROM A SINGLE SET OF BLOOD CULTURES WHEN MULTIPLE SETS ARE DRAWN IS UNCERTAIN. PLEASE NOTIFY THE MICROBIOLOGY DEPARTMENT WITHIN ONE WEEK IF SPECIATION AND SENSITIVITIES ARE REQUIRED. Performed at [facility], [place] [address] [place], [place] [address]    Report Status 03/19/2022 FINAL  Final   Organism ID, Bacteria ESCHERICHIA COLI  Final      Susceptibility   Escherichia coli - MIC*    AMPICILLIN >=32 RESISTANT Resistant     CEFAZOLIN <=4 SENSITIVE Sensitive     CEFEPIME <=0.12 SENSITIVE Sensitive     CEFTAZIDIME <=1 SENSITIVE Sensitive     CEFTRIAXONE <=0.25 SENSITIVE Sensitive     CIPROFLOXACIN <=0.25 SENSITIVE Sensitive     GENTAMICIN <=1 SENSITIVE Sensitive     IMIPENEM <=0.25 SENSITIVE Sensitive     TRIMETH/SULFA <=20 SENSITIVE Sensitive     AMPICILLIN/SULBACTAM >=32 RESISTANT Resistant     PIP/TAZO >=128 RESISTANT Resistant     * ESCHERICHIA COLI  Blood Culture ID Panel (Reflexed)     Status: Abnormal   Collection Time: 03/16/22  1:55 PM  Result Value Ref Range Status   Enterococcus faecalis NOT DETECTED NOT DETECTED Final   Enterococcus Faecium NOT DETECTED NOT DETECTED Final   Listeria monocytogenes NOT DETECTED NOT DETECTED Final   Staphylococcus species NOT DETECTED NOT DETECTED Final   Staphylococcus aureus (BCID) NOT DETECTED NOT DETECTED Final   Staphylococcus epidermidis NOT DETECTED NOT DETECTED Final   Staphylococcus lugdunensis NOT DETECTED NOT DETECTED Final   Streptococcus species NOT DETECTED NOT DETECTED Final   Streptococcus agalactiae NOT DETECTED NOT DETECTED Final   Streptococcus pneumoniae NOT DETECTED NOT DETECTED Final   Streptococcus pyogenes NOT DETECTED NOT DETECTED Final   A.calcoaceticus-baumannii NOT DETECTED NOT DETECTED Final   Bacteroides fragilis NOT DETECTED NOT DETECTED Final   Enterobacterales DETECTED (A) NOT DETECTED Final    Comment: Enterobacterales represent a large order of gram negative  bacteria, not a single organism. CRITICAL RESULT CALLED TO, READ BACK BY AND VERIFIED WITH: NATHAN BELUE PHARMD [id number] 03/17/22 HNM    Enterobacter cloacae complex NOT DETECTED NOT DETECTED Final   Escherichia coli DETECTED (A) NOT DETECTED Final    Comment: CRITICAL RESULT CALLED TO, READ BACK BY AND VERIFIED WITH: NATHAN BELUE PHARMD [id number] 03/17/22 HNM    Klebsiella aerogenes NOT DETECTED NOT DETECTED Final   Klebsiella oxytoca NOT DETECTED NOT DETECTED Final   Klebsiella pneumoniae NOT DETECTED NOT DETECTED Final   Proteus species NOT DETECTED NOT DETECTED Final   Salmonella species NOT DETECTED NOT DETECTED Final   Serratia marcescens NOT DETECTED NOT DETECTED Final   Haemophilus influenzae NOT DETECTED NOT DETECTED Final   Neisseria meningitidis NOT DETECTED NOT DETECTED Final   Pseudomonas aeruginosa NOT DETECTED NOT DETECTED Final   Stenotrophomonas maltophilia NOT DETECTED NOT DETECTED Final   Candida albicans NOT DETECTED NOT DETECTED Final   Candida auris NOT DETECTED NOT DETECTED Final   Candida glabrata NOT DETECTED NOT DETECTED Final   Candida krusei NOT DETECTED NOT DETECTED Final   Candida parapsilosis NOT DETECTED  NOT DETECTED Final   Candida tropicalis NOT DETECTED NOT DETECTED Final   Cryptococcus neoformans/gattii NOT DETECTED NOT DETECTED Final   CTX-M ESBL NOT DETECTED NOT DETECTED Final   Carbapenem resistance IMP NOT DETECTED NOT DETECTED Final   Carbapenem resistance KPC NOT DETECTED NOT DETECTED Final   Carbapenem resistance NDM NOT DETECTED NOT DETECTED Final   Carbapenem resist OXA 48 LIKE NOT DETECTED NOT DETECTED Final   Carbapenem resistance VIM NOT DETECTED NOT DETECTED Final    Comment: Performed at [facility], [address], [place], [place] [address]  Blood Culture (routine x 2)     Status: Abnormal   Collection Time: 03/16/22  1:56 PM   Specimen: BLOOD  Result Value Ref Range Status   Specimen Description   Final    BLOOD LEFT  ANTECUBITAL Performed at [facility], [address] [place], [place] [address]    Special Requests   Final    BOTTLES DRAWN AEROBIC AND ANAEROBIC Blood Culture adequate volume Performed at [facility], [address] [place], [place] [address]    Culture  Setup Time   Final    GRAM POSITIVE COCCI AEROBIC BOTTLE ONLY CRITICAL RESULT CALLED TO, READ BACK BY AND VERIFIED WITH: DEVAN MITCHELL PHARMD [id number] 03/17/22 HNM    Culture (A)  Final    STAPHYLOCOCCUS HOMINIS THE SIGNIFICANCE OF ISOLATING THIS ORGANISM FROM A SINGLE SET OF BLOOD CULTURES WHEN MULTIPLE SETS ARE DRAWN IS UNCERTAIN. PLEASE NOTIFY THE MICROBIOLOGY DEPARTMENT WITHIN ONE WEEK IF SPECIATION AND SENSITIVITIES ARE REQUIRED. Performed at [facility], [place] [address], [place],  [address]    Report Status 03/18/2022 FINAL  Final  Resp Panel by RT-PCR (Flu A&B, Covid) Anterior Nasal Swab     Status: None   Collection Time: 03/16/22  3:00 PM   Specimen: Anterior Nasal Swab  Result Value Ref Range Status   SARS Coronavirus 2 by RT PCR NEGATIVE NEGATIVE Final    Comment: (NOTE) SARS-CoV-2 target nucleic acids are NOT DETECTED.  The SARS-CoV-2 RNA is generally detectable in upper respiratory specimens during the acute phase of infection. The lowest concentration of SARS-CoV-2 viral copies this assay can detect is 138 copies/mL. A negative result does not preclude SARS-Cov-2 infection and should not be used as the sole basis for treatment or other patient management decisions. A negative result may occur with  improper specimen collection/handling, submission of specimen other than nasopharyngeal swab, presence of viral mutation(s) within the areas targeted by this assay, and inadequate number of viral copies(<138 copies/mL). A negative result must be combined with clinical observations, patient history, and epidemiological information. The expected result is Negative.  Fact Sheet for  Patients:  EntrepreneurPulse.com.au  Fact Sheet for Healthcare Providers:  IncredibleEmployment.be  This test is no t yet approved or cleared by the [place] FDA and  has been authorized for detection and/or diagnosis of SARS-CoV-2 by FDA under an Emergency Use Authorization (EUA). This EUA will remain  in effect (meaning this test can be used) for the duration of the COVID-19 declaration under Section 564(b)(1) of the Act, 21 U.S.C.section 360bbb-3(b)(1), unless the authorization is terminated  or revoked sooner.       Influenza A by PCR NEGATIVE NEGATIVE Final   Influenza B by PCR NEGATIVE NEGATIVE Final    Comment: (NOTE) The Xpert Xpress SARS-CoV-2/FLU/RSV plus assay is intended as an aid in the diagnosis of influenza from Nasopharyngeal swab specimens and should not be used as a  sole basis for treatment. Nasal washings and aspirates are unacceptable for Xpert Xpress SARS-CoV-2/FLU/RSV testing.  Fact Sheet for Patients: EntrepreneurPulse.com.au  Fact Sheet for Healthcare Providers: IncredibleEmployment.be  This test is not yet approved or cleared by the [place] FDA and has been authorized for detection and/or diagnosis of SARS-CoV-2 by FDA under an Emergency Use Authorization (EUA). This EUA will remain in effect (meaning this test can be used) for the duration of the COVID-19 declaration under Section 564(b)(1) of the Act, 21 U.S.C. section 360bbb-3(b)(1), unless the authorization is terminated or revoked.  Performed at [facility], [address] [place], [place] [address]   Urine Culture     Status: Abnormal   Collection Time: 03/16/22  3:00 PM   Specimen: Urine, Random  Result Value Ref Range Status   Specimen Description   Final    URINE, RANDOM Performed at [facility], [address] [place], [place] [address]    Special Requests   Final     NONE Performed at [facility], [place] [address], [place], [place] [address]    Culture 20,000 COLONIES/mL ESCHERICHIA COLI (A)  Final   Report Status 03/18/2022 FINAL  Final   Organism ID, Bacteria ESCHERICHIA COLI (A)  Final      Susceptibility   Escherichia coli - MIC*    AMPICILLIN >=32 RESISTANT Resistant     CEFAZOLIN <=4 SENSITIVE Sensitive     CEFEPIME <=0.12 SENSITIVE Sensitive     CEFTRIAXONE <=0.25 SENSITIVE Sensitive     CIPROFLOXACIN <=0.25 SENSITIVE Sensitive     GENTAMICIN <=1 SENSITIVE Sensitive     IMIPENEM <=0.25 SENSITIVE Sensitive     NITROFURANTOIN <=16 SENSITIVE Sensitive     TRIMETH/SULFA <=20 SENSITIVE Sensitive     AMPICILLIN/SULBACTAM >=32 RESISTANT Resistant     PIP/TAZO >=128 RESISTANT Resistant     * 20,000 COLONIES/mL ESCHERICHIA COLI  MRSA Next Gen by PCR, Nasal     Status: None   Collection Time: 03/16/22  9:22 PM   Specimen: Nasal Mucosa; Nasal Swab  Result Value Ref Range Status   MRSA by PCR Next Gen NOT DETECTED NOT DETECTED Final    Comment: (NOTE) The GeneXpert MRSA Assay (FDA approved for NASAL specimens only), is one component of a comprehensive MRSA colonization surveillance program. It is not intended to diagnose MRSA infection nor to guide or monitor treatment for MRSA infections. Test performance is not FDA approved in patients less than 26 years old. Performed at [facility], [address] [place], [place] [address]   Blood Culture ID Panel (Reflexed)     Status: Abnormal   Collection Time: 03/17/22  8:50 AM  Result Value Ref Range Status   Enterococcus faecalis NOT DETECTED NOT DETECTED Final   Enterococcus Faecium NOT DETECTED NOT DETECTED Final   Listeria monocytogenes NOT DETECTED NOT DETECTED Final   Staphylococcus species DETECTED (A) NOT DETECTED Final    Comment: CRITICAL RESULT CALLED TO, READ BACK BY AND VERIFIED WITH: DEVAN MITCHELL PHARMD [id number] 03/17/22 HNM    Staphylococcus aureus (BCID) NOT DETECTED  NOT DETECTED Final   Staphylococcus epidermidis NOT DETECTED NOT DETECTED Final   Staphylococcus lugdunensis NOT DETECTED NOT DETECTED Final   Streptococcus species NOT DETECTED NOT DETECTED Final   Streptococcus agalactiae NOT DETECTED NOT DETECTED Final   Streptococcus pneumoniae NOT DETECTED NOT DETECTED Final   Streptococcus pyogenes NOT DETECTED NOT DETECTED Final   A.calcoaceticus-baumannii NOT DETECTED NOT DETECTED Final   Bacteroides fragilis NOT  DETECTED NOT DETECTED Final   Enterobacterales NOT DETECTED NOT DETECTED Final   Enterobacter cloacae complex NOT DETECTED NOT DETECTED Final   Escherichia coli NOT DETECTED NOT DETECTED Final   Klebsiella aerogenes NOT DETECTED NOT DETECTED Final   Klebsiella oxytoca NOT DETECTED NOT DETECTED Final   Klebsiella pneumoniae NOT DETECTED NOT DETECTED Final   Proteus species NOT DETECTED NOT DETECTED Final   Salmonella species NOT DETECTED NOT DETECTED Final   Serratia marcescens NOT DETECTED NOT DETECTED Final   Haemophilus influenzae NOT DETECTED NOT DETECTED Final   Neisseria meningitidis NOT DETECTED NOT DETECTED Final   Pseudomonas aeruginosa NOT DETECTED NOT DETECTED Final   Stenotrophomonas maltophilia NOT DETECTED NOT DETECTED Final   Candida albicans NOT DETECTED NOT DETECTED Final   Candida auris NOT DETECTED NOT DETECTED Final   Candida glabrata NOT DETECTED NOT DETECTED Final   Candida krusei NOT DETECTED NOT DETECTED Final   Candida parapsilosis NOT DETECTED NOT DETECTED Final   Candida tropicalis NOT DETECTED NOT DETECTED Final   Cryptococcus neoformans/gattii NOT DETECTED NOT DETECTED Final    Comment: Performed at [facility], [address] [place], [place] [address]  Culture, blood (Routine X 2) w Reflex to ID Panel     Status: None (Preliminary result)   Collection Time: 03/17/22  1:54 PM   Specimen: BLOOD  Result Value Ref Range Status   Specimen Description BLOOD RIGHT ANTECUBITAL  Final   Special  Requests   Final    BOTTLES DRAWN AEROBIC AND ANAEROBIC Blood Culture adequate volume   Culture   Final    NO GROWTH 2 DAYS Performed at [facility], [address] [place], [place] [address]    Report Status PENDING  Incomplete  Culture, blood (Routine X 2) w Reflex to ID Panel     Status: None (Preliminary result)   Collection Time: 03/17/22  2:06 PM   Specimen: BLOOD  Result Value Ref Range Status   Specimen Description BLOOD BLOOD RIGHT ARM  Final   Special Requests   Final    BOTTLES DRAWN AEROBIC AND ANAEROBIC Blood Culture adequate volume   Culture   Final    NO GROWTH 2 DAYS Performed at [facility], [address] [place], [place] [address]    Report Status PENDING  Incomplete    Labs: CBC: Recent Labs  Lab 03/16/22 1355 03/17/22 0440 03/19/22 0536  WBC 7.0 6.7 5.0  NEUTROABS 6.1  --   --   HGB 12.7 11.0* 11.2*  HCT 40.9 36.6 35.1*  MCV 87.4 90.4 85.2  PLT 120* 119* 914*   Basic Metabolic Panel: Recent Labs  Lab 03/16/22 1355 03/17/22 0440 03/18/22 0637 03/19/22 0536  NA 140 141  --  143  K 4.0 3.6  --  3.4*  CL 109 113*  --  115*  CO2 21* 21*  --  22  GLUCOSE 130* 79  --  94  BUN 34* 30*  --  23  CREATININE 1.21* 1.10* 1.15* 1.07*  CALCIUM 9.5 8.4*  --  9.0  MG 2.0  --   --   --    Liver Function Tests: Recent Labs  Lab 03/16/22 1355  AST 48*  ALT 18  ALKPHOS 75  BILITOT 1.5*  PROT 6.4*  ALBUMIN 3.4*   CBG: Recent Labs  Lab 03/17/22 1050 03/18/22 0839  GLUCAP 103* 94    Discharge time spent: greater than 30 minutes.  This record has been created using [employer]  voice recognition software. Errors have been sought and corrected,but may not always be located. Such creation errors do not reflect on the standard of care.   Signed: Lorella Nimrod, MD [facility] 03/19/2022

## 2022-03-19 NOTE — TOC Initial Note (Signed)
Transition of Care [facility]) - Initial/Assessment Note    Patient Details  Name: Faith Vasquez MRN: [id number] Date of Birth: [birth year]  Transition of Care [facility]) CM/SW Contact:    Beverly Sessions, RN Phone Number: 03/19/2022, 2:05 PM  Clinical Narrative:                    Patient admitted from [place] at [facility] Confirmed with Alma Downs at [facility] that patient can return today Fl2, dc summary, progress note and palliative notes faxed to [facility] Discussed home health services.  Alma Downs states their provider will assess patient tomorrow and determine if services are indicated  Outpatient palliative referral made to [facility] with [facility]  Family to transport at 230     Patient Goals and CMS Choice        Expected Discharge Plan and Services           Expected Discharge Date: 03/19/22                                    Prior Living Arrangements/Services                       Activities of Daily Living Home Assistive Devices/Equipment: [place] Rile (specify type) ADL Screening (condition at time of admission) Patient's cognitive ability adequate to safely complete daily activities?: No Is the patient deaf or have difficulty hearing?: No Does the patient have difficulty seeing, even when wearing glasses/contacts?: No Does the patient have difficulty concentrating, remembering, or making decisions?: Yes Patient able to express need for assistance with ADLs?: No Does the patient have difficulty dressing or bathing?: No Independently performs ADLs?: No Bathing: Needs assistance Toileting: Needs assistance Does the patient have difficulty walking or climbing stairs?: Yes Weakness of Legs: None Weakness of Arms/Hands: None  Permission Sought/Granted                  Emotional Assessment              Admission diagnosis:  Acute encephalopathy [G93.40] Sepsis secondary to UTI ([place]) [A41.9, N39.0] Severe sepsis ([place])  [A41.9, R65.20] Aspiration pneumonia of right lower lobe, unspecified aspiration pneumonia type ([place]) [J69.0] Patient Active Problem List   Diagnosis Date Noted   Delirium 03/18/2022   Abnormal finding on imaging 03/17/2022   Sepsis secondary to UTI ([place]) 03/17/2022   Severe sepsis ([place]) 03/16/2022   HCAP (healthcare-associated pneumonia) 03/16/2022   Aspiration pneumonia ([place]) 03/16/2022   Fall 05/28/7251   Acute metabolic encephalopathy 66/44/0347   Thrombocytopenia ([place]) 03/16/2022   Prolonged QT interval 03/16/2022   Anxiety 03/16/2022   Chronic kidney disease, stage 3a ([place]) 03/16/2022   Acute hypoxemic respiratory failure ([place]) 12/20/2021   Essential hypertension 12/20/2021   Advanced dementia ([place]) 12/20/2021   Right hip pain 12/20/2021   UTI (urinary tract infection) 12/20/2021   Acute respiratory failure ([place]) 09/19/2021   Multifocal pneumonia 09/19/2021   GERD (gastroesophageal reflux disease) 03/24/2020   PCP:  Gladstone Lighter, MD Pharmacy:   [id number] [place], [address] AT [place] [address] [place] [address] Phone: [phone] Fax: [phone]  [id number] [place], [place] [address] Phone: [phone] Fax: [phone]     Social Determinants of Health (SDOH) Interventions    Readmission  Risk Interventions    03/19/2022    2:02 PM  Readmission Risk Prevention Plan  Transportation Screening Complete  HRI or Home Care Consult Complete  Social Work Consult for Recovery Care Planning/Counseling Complete  Palliative Care Screening Complete  Medication Review [employer]) Complete

## 2022-03-19 NOTE — Progress Notes (Addendum)
Speech Language Pathology Treatment: Dysphagia  Patient Details Name: Faith Vasquez MRN: [id number] DOB: May 31, 1922 Today's Date: 03/19/2022 Time: 9201-0071 SLP Time Calculation (min) (ACUTE ONLY): 30 min  Assessment / Plan / Recommendation Clinical Impression  Pt seen for ongoing assessment of swallowing today. Pt continues to present w/ some behavioral challenges d/t her Dementia/Cognitive decline. Meds being given to manage pt's pain and anxiety, however, these meds can impact alertness if not careful.  She has a Baseline dx of Dementia. Required gentle assurance during awaking and min redirecting during session. Pt is on RA; afebrile. WBC wnl.    Pt appears to present w/ no gross oropharyngeal phase dysphagia; no difficulty swallowing in setting of declined Cognitive status; Baseline Dementia. ANY Cognitive decline can impact overall awareness and timing of swallowing and safety during po tasks which increases risk for aspiration, choking.  Pt's risk for aspiration appears greatly reduced when following general aspiration precautions, Monitoring and Supporting pt w/ oral intake/meals, and using a slightly modified diet consistency of Cut foods.  She required min-mod verbal/visual cues for follow through during po tasks and self-feeding.        Pt consumed several trials of thin liquids via Straw w/ No overt clinical s/s of aspiration noted: no decline in vocal quality; no cough, and no decline in respiratory status during/post trials. Oral phase was appropriate for bolus management and oral clearing of the boluses given. Pt given setup support then fed self w/ min cues. Self-feeding improves safety of swallowing overall. NSG reported good toleration of po's at breakfast meal.         In setting of baseline Dementia, Cognitive decline, recommend a more dysphagia level 3(soft solid foods moistened for ease of oral phase) w/ thin liquids VIA CUP; general aspiration precautions; reduce  Distractions during meals and engage pt for Self-feeding during meals. Pills 1 at a time w/ Water, or in Puree for safer swallowing if needed d/t the Cognitive decline. Supervision at meals as needed d/t Impulsive eating behaviors per Family report. Also, if drowsy from Sedating Medications, then do not eat/drink at that time. Monitor State and calmness/attention during meals also. MD/NSG updated.  Discussed the above w/ Palliative Care; NSG.  ST services recommends follow w/ Palliative Care for [name] and education re: impact of Cognitive decline/Dementia on oral intake, swallowing. Suspect pt is close to/at her baseline.  Precautions posted in room.   SUSPECT PT CAN MOVE BACK TO HER USUAL DIET POST DISCHARGE AND POST ACUITY OF ILLNESS WHEN CONFUSION AND ANXIETY MAY BE A LITTLE LESS. NEEDS SUPERVISION W/ MEALS TO EAT SLOWLY; USE SMALL, SINGLE BITES. DO NOT EAT/DRINK IF LETHARGIC FROM SEDATING MEDS.          HPI HPI: Pt  is a 88 y.o. female with medical history significant of hypertension, GERD, anxiety, C. difficile colitis, advanced dementia, CKD-3a,  who presents with fever, chills, weakness, multiple Falls, worsening mental status; suspected UTI.   Per her daughter-in-law at the bedside, patient has Dementia.  At her normal baseline, patient recognizes family members, oriented to place, most of the time patient is confused with time.  In the past several days, patient is more confused.  She recognizes family member, but disoriented to place and time.  She moves all extremities.  No facial droop or slurred speech.  Patient has generalized weakness.  She was noted to have fever, chills, shaking at admit to the ED.   CXR: Increased hazy opacity along the right heart border is worrisome  for  infection. Apparent interval increase in rightward deviation of the trachea may be due to positioning. If there is clinical concern for mediastinal pathology, further evaluation with CT chest is recommended.    CT of  Head and Spine: No acute intracranial abnormality. Sequela of chronic  microvascular ischemic change.  2. No acute cervical spine fracture.  3. Apparent polypoid soft tissue mass along the posterior aspect of  the trachea. Recommend direct visualization.      SLP Plan  All goals met      Recommendations for follow up therapy are one component of a multi-disciplinary discharge planning process, led by the attending physician.  Recommendations may be updated based on patient status, additional functional criteria and insurance authorization.    Recommendations  Diet recommendations: Dysphagia 3 (mechanical soft);Thin liquid (for ease of prep and self-feeding; meats cut) Liquids provided via: Cup;Straw Medication Administration: Whole meds with puree (as needed for safer swallowing; Crushed as needed) Supervision: Patient able to self feed;Intermittent supervision to cue for compensatory strategies (support) Compensations: Minimize environmental distractions;Slow rate;Small sips/bites;Follow solids with liquid Postural Changes and/or Swallow Maneuvers: Out of bed for meals;Seated upright 90 degrees;Upright 30-60 min after meal                General recommendations:  (Dietician f/u; Palliative Care f/u) Oral Care Recommendations: Oral care BID;Oral care before and after PO;Staff/trained caregiver to provide oral care (support) Follow Up Recommendations: No SLP follow up Assistance recommended at discharge: Intermittent Supervision/Assistance (w/ setup at meals; feeding support) SLP Visit Diagnosis: Dysphagia, unspecified (R13.10) (pt has Baseline Dementia, Cognitive decline which impacts her attention/awareness during tasks) Plan: All goals met             Faith Vasquez, [place], [place]; [place] [phone] (ascom) Faith Vasquez  03/19/2022, 12:21 PM

## 2022-03-19 NOTE — Progress Notes (Signed)
Mobility Specialist - Progress Note    03/19/22 1401  Mobility  Activity Ambulated with assistance in room;Ambulated with assistance in hallway  Level of Assistance Standby assist, set-up cues, supervision of patient - no hands on  Assistive Device Front wheel walker  Distance Ambulated (ft) 20 ft  Activity Response Tolerated well  Mobility Referral Yes  $Mobility charge 1 Mobility   MS responding to chair alarm, RN present upon entry. Pt standing in front of recliner upon entry, utilizing RA. Pt ambulated 10 ft in the room and 10 ft outside of room with RW, tolerated well. Pt requesting to sit in bed once in the hallway, returned to room and left supine with RN present at bedside.   [place]ie [place] Mobility Specialist 03/19/22 2:07 PM

## 2022-03-23 LAB — CULTURE, BLOOD (ROUTINE X 2)
Culture: NO GROWTH
Culture: NO GROWTH
Special Requests: ADEQUATE
Special Requests: ADEQUATE

## 2022-03-26 ENCOUNTER — Non-Acute Institutional Stay: Payer: Medicare Other | Admitting: Nurse Practitioner

## 2022-03-26 ENCOUNTER — Encounter: Payer: Self-pay | Admitting: Nurse Practitioner

## 2022-03-26 DIAGNOSIS — R5381 Other malaise: Secondary | ICD-10-CM

## 2022-03-26 DIAGNOSIS — Z515 Encounter for palliative care: Secondary | ICD-10-CM

## 2022-03-26 DIAGNOSIS — F0390 Unspecified dementia without behavioral disturbance: Secondary | ICD-10-CM

## 2022-03-26 NOTE — Progress Notes (Unsigned)
[facility] Consult Note Telephone: [phone]  Fax: [phone]   Date of encounter: 03/26/22 6:55 PM PATIENT NAME: Faith Vasquez [id number]   [phone] (home)  DOB: July 12, 1922 MRN: [id number] PRIMARY CARE PROVIDER:    Kandis Vasquez, [place]  RESPONSIBLE PARTY:    Contact Information     Name Relation Home Work Mobile   Faith Vasquez Son   [phone]   Faith, Vasquez Relative   [phone]   [facility] Daughter   [phone]      I met face to face with patient in facility. Palliative Care was asked to follow this patient by consultation request of  Faith Lighter, MD to address advance care planning and complex medical decision making. This is the initial visit.                            ASSESSMENT AND PLAN / RECOMMENDATIONS:  Symptom Management/Plan: 1. Advance Care Planning;  DNR 2. Goals of Care: Goals include to maximize quality of life and symptom management. Our advance care planning conversation included a discussion about:    The value and importance of advance care planning  Exploration of personal, cultural or spiritual beliefs that might influence medical decisions  Exploration of goals of care in the event of a sudden injury or illness  Identification and preparation of a healthcare agent  Review and updating or creation of an advance directive document.  3. Debility secondary to dementia, progressive; continue to monitor, supportive measures. Weights; appetite; cognitive and functional changes, fall precautions  4. Palliative care encounter; Palliative care encounter; Palliative medicine team will continue to support patient, patient's family, and medical team. Visit consisted of counseling and education dealing with the complex and emotionally intense issues of symptom management and palliative care in the setting of serious and potentially life-threatening  illness   Follow up Palliative Care Visit: Palliative care will continue to follow for complex medical decision making, advance care planning, and clarification of goals. Return 4 weeks or prn.  I spent 32 minutes providing this consultation. More than 50% of the time in this consultation was spent in counseling and care coordination. PPS: 50% Chief Complaint: Follow up palliative consult for complex medical decision making, address goals, manage ongoing symptoms  HISTORY OF PRESENT ILLNESS:  Faith Vasquez is a 88 y.o. year old female  with multiple medical problems including Vit d def, Alzheimer's,  hld, htn, ckd, tiaS/P stroke, gerd, diverticulosis, C difficile, history of UTI, left hip fracture S/P surgical repair, diabetes, B12 deficiency, left rotator cuff tendonitis.color cystectomy, hysterectomy, partial colectomy, appendectomy. Hospitalized 03/16/2022 to 03/19/2022 with severe sepsis, healthcare associated pneumonia with urinary tract infection, choking episodes and altered mental status with frequent falling. Blood cultures revealing E coli antibiotics switch to rocephin and flagyl. Pulmonology consulted with concern for a soft tissue mass behind trachea noted on CT head slash neck, ENT consult which appears to be in the glottis slash epiglottis area. [facility] showed no abnormalities mass appears to be below larynx recommended barium swallow and bronchoscopy her family wishes to pursue. Family did not want to be aggressive due to advanced age and dementia and wish for palliative care consult. Barium swallow showed severe esophageal dysmotilityWith small sliding type hiatal hernia and no esophageal mass or structured identified. Family wish no further invasive measures with repeated blood cultures negative. She was discharged back to [facility] memory  care unit where she currently resides. Just a DNR. I visited and observed Faith Vasquez in her Locked Memory Care unit at  [facility]. She was sitting in the dining area with other residents feeding herself lunch. No s/s aspiration while observed. Faith Vasquez requires assistance with adl's including bathing, dressing, ambulation with a walker. Faith Vasquez is able to feed herself with fair appetite per staff. Faith Vasquez did make eye contact, answer one word answers then looked away. Faith Vasquez did intermit smile. Limited interaction due to cognitive impairment. Faith Vasquez did not allow assessment, became agitated, though re-assured and was calm. Support provided. Medical goals reviewed  03/27/2022 I called Faith Vasquez's son, Faith Vasquez, clinical update discussed, we talked about role pc, medical goals. Currently Faith Vasquez wife is at [facility], questions answered, support provided, contact information given  History obtained from review of EMR, discussion with primary team, and interview with family, facility staff/caregiver and/or Faith Vasquez.  I reviewed available labs, medications, imaging, studies and related documents from the EMR.  Records reviewed and summarized above.   ROS 10 point system reviewed all negative except HPI  Physical Exam: Constitutional: NAD General: frail appearing, elderly female EYES: lids intact ENMT: oral mucous membranes moist MSK: ambulatory with walker Skin: warm and dry Neuro:  no generalized weakness,  no cognitive impairment Psych: non-anxious affect, Alert, confused CURRENT PROBLEM LIST:  Patient Active Problem List   Diagnosis Date Noted   Delirium 03/18/2022   Abnormal finding on imaging 03/17/2022   Sepsis secondary to UTI ([place]) 03/17/2022   Severe sepsis ([place]) 03/16/2022   HCAP (healthcare-associated pneumonia) 03/16/2022   Aspiration pneumonia ([place]) 03/16/2022   Fall 80/99/8338   Acute metabolic encephalopathy 25/09/3974   Thrombocytopenia ([place]) 03/16/2022   Prolonged QT interval 03/16/2022   Anxiety 03/16/2022   Chronic kidney disease, stage 3a ([place])  03/16/2022   Acute hypoxemic respiratory failure ([place]) 12/20/2021   Essential hypertension 12/20/2021   Advanced dementia ([place]) 12/20/2021   Right hip pain 12/20/2021   UTI (urinary tract infection) 12/20/2021   Acute respiratory failure ([place]) 09/19/2021   Multifocal pneumonia 09/19/2021   GERD (gastroesophageal reflux disease) 03/24/2020   PAST MEDICAL HISTORY:  Active Ambulatory Problems    Diagnosis Date Noted   GERD (gastroesophageal reflux disease) 03/24/2020   Acute respiratory failure ([place]) 09/19/2021   Multifocal pneumonia 09/19/2021   Acute hypoxemic respiratory failure ([place]) 12/20/2021   Essential hypertension 12/20/2021   Advanced dementia ([place]) 12/20/2021   Right hip pain 12/20/2021   UTI (urinary tract infection) 12/20/2021   Severe sepsis ([place]) 03/16/2022   HCAP (healthcare-associated pneumonia) 03/16/2022   Aspiration pneumonia ([place]) 03/16/2022   Fall 73/41/9379   Acute metabolic encephalopathy 02/40/9735   Thrombocytopenia ([place]) 03/16/2022   Prolonged QT interval 03/16/2022   Anxiety 03/16/2022   Chronic kidney disease, stage 3a ([place]) 03/16/2022   Abnormal finding on imaging 03/17/2022   Sepsis secondary to UTI ([employer]) 03/17/2022   Delirium 03/18/2022   Resolved Ambulatory Problems    Diagnosis Date Noted   Closed left hip fracture ([place]) 03/24/2020   Left displaced femoral neck fracture ([place]) 03/24/2020   Hypokalemia 09/19/2021   Dehydration 09/19/2021   Abdominal distention    Acute sepsis [facility])    Past Medical History:  Diagnosis Date   Diverticulosis    History of Clostridium difficile infection    SOCIAL HX:  Social History   Tobacco Use   Smoking status: Never   Smokeless tobacco: Never  Substance Use Topics  Alcohol use: Never   FAMILY HX: History reviewed. No pertinent family history.    ALLERGIES:  Allergies  Allergen Reactions   Codeine    Iodine    Macrobid [Nitrofurantoin] Other (See Comments)    Unknown reaction.    Ciprofloxacin Rash    Other reaction(s): whelps     PERTINENT MEDICATIONS:  Outpatient Encounter Medications as of 03/26/2022  Medication Sig   acetaminophen (TYLENOL) 325 MG tablet Take 2 tablets by mouth in the morning, at noon, and at bedtime.   ALPRAZolam (XANAX) 0.5 MG tablet Take 0.25 mg by mouth at bedtime. At 5pm   amLODipine (NORVASC) 5 MG tablet Take 5 mg by mouth daily. (Patient not taking: Reported on 03/16/2022)   aspirin 81 MG EC tablet Chew 81 mg by mouth daily.   cefUROXime (CEFTIN) 500 MG tablet Take 1 tablet (500 mg total) by mouth 2 (two) times daily with a meal for 6 days.   Cholecalciferol (VITAMIN D3) 50 MCG (2000 UT) TABS Take 1 capsule by mouth daily. Wednesday   feeding supplement (ENSURE ENLIVE / ENSURE PLUS) LIQD Take 237 mLs by mouth 2 (two) times daily between meals.   guaifenesin (HUMIBID E) 400 MG TABS tablet Take 400 mg by mouth every 4 (four) hours as needed.   HYDROcodone-acetaminophen (NORCO/VICODIN) 5-325 MG tablet Take 1 tablet by mouth every 4 (four) hours as needed for moderate pain or severe pain.   magnesium hydroxide (MILK OF MAGNESIA) 400 MG/5ML suspension Take 30 mLs by mouth daily as needed for mild constipation or moderate constipation.   ondansetron (ZOFRAN) 4 MG tablet Take 4 mg by mouth every 6 (six) hours as needed for nausea or vomiting.   polyethylene glycol (MIRALAX / GLYCOLAX) 17 g packet Take 17 g by mouth 2 (two) times daily.   risperiDONE (RISPERDAL) 0.25 MG tablet Take 1 tablet by mouth 2 (two) times daily.   senna-docusate (SENOKOT-S) 8.6-50 MG tablet Take 2 tablets by mouth 2 (two) times daily.   No facility-administered encounter medications on file as of 03/26/2022.   Thank you for the opportunity to participate in the care of Ms. Danish.  The palliative care team will continue to follow. Please call our office at [phone] if we can be of additional assistance.   Daesha Insco Z Carrie Usery, NP ,

## 2022-04-25 ENCOUNTER — Encounter: Payer: Self-pay | Admitting: Nurse Practitioner

## 2022-04-25 ENCOUNTER — Non-Acute Institutional Stay: Payer: Medicare Other | Admitting: Nurse Practitioner

## 2022-04-25 DIAGNOSIS — R5381 Other malaise: Secondary | ICD-10-CM

## 2022-04-25 DIAGNOSIS — Z515 Encounter for palliative care: Secondary | ICD-10-CM

## 2022-04-25 DIAGNOSIS — F0390 Unspecified dementia without behavioral disturbance: Secondary | ICD-10-CM

## 2022-04-25 NOTE — Progress Notes (Signed)
Therapist, [facility] Palliative Care Consult Note Telephone: [phone]  Fax: [phone]    Date of encounter: 04/25/22 9:23 PM PATIENT NAME: Faith Vasquez [address] [place] [address]   [phone] (home)  DOB: [birth year] MRN: [id number] PRIMARY CARE PROVIDER:    [facility], Locked Memory Care Unit  RESPONSIBLE PARTY:    Contact Information     Name Relation Home Work Mobile   Faith Vasquez   [phone]   Faith Vasquez Relative   [phone]   Faith Vasquez Daughter   [phone]     Palliative Care was asked to follow this patient by consultation request of  Faith Baas, MD to address advance care planning and complex medical decision making. This is the initial visit.                            ASSESSMENT AND PLAN / RECOMMENDATIONS:  Symptom Management/Plan: 1. Advance Care Planning;  DNR 2. Debility secondary to dementia, progressive; continue to monitor, supportive measures. Weights; appetite; cognitive and functional changes, fall precautions  04/09/2022 weight 142 lbs   3. Palliative care encounter; Palliative care encounter; Palliative medicine team will continue to support patient, patient's family, and medical team. Visit consisted of counseling and education dealing with the complex and emotionally intense issues of symptom management and palliative care in the setting of serious and potentially life-threatening illness   Follow up Palliative Care Visit: Palliative care will continue to follow for complex medical decision making, advance care planning, and clarification of goals. Return 4 weeks or prn.   I spent 30 minutes providing this consultation. More than 50% of the time in this consultation was spent in counseling and care coordination. PPS: 50% Chief Complaint: Follow up palliative consult for complex medical decision making, address goals, manage ongoing symptoms   HISTORY OF PRESENT ILLNESS:   Faith Vasquez is a 88 y.o. year old female  with multiple medical problems including Vit d def, Alzheimer's,  hld, htn, ckd, tiaS/P stroke, gerd, diverticulosis, C difficile, history of UTI, left hip fracture S/P surgical repair, diabetes, B12 deficiency, left rotator cuff tendonitis.color cystectomy, hysterectomy, partial colectomy, appendectomy. Hospitalized 03/16/2022 to 03/19/2022 with severe sepsis, healthcare associated pneumonia with urinary tract infection, choking episodes and altered mental status with frequent falling. Blood cultures revealing E coli antibiotics switch to rocephin and flagyl. Pulmonology consulted with concern for a soft tissue mass behind trachea noted on CT head slash neck, ENT consult which appears to be in the glottis slash epiglottis area. [facility] showed no abnormalities mass appears to be below larynx recommended barium swallow and bronchoscopy her family wishes to pursue. Family did not want to be aggressive due to advanced age and dementia and wish for palliative care consult. Barium swallow showed severe esophageal dysmotilityWith small sliding type hiatal hernia and no esophageal mass or structured identified. Family wish no further invasive measures with repeated blood cultures negative. She was discharged back to [facility] memory care unit where she currently resides. DNR in place. Ms. Thu requires assistance with adl's including bathing, dressing, toileting; feeding, staff endorses no new changes or concerns, no recent hospitalizations, wounds, falls, infections per staff. Medications, medicals reviewed, attempted to call dtg for updated on pc visit, message left. No new changes.    ROS 10 point system reviewed all negative except HPI   Physical Exam: Thank you for the opportunity to participate in the care of  Faith Vasquez. Please call our office at [phone] if we can be of additional assistance.   Faith Lara Prince [place], NP

## 2022-05-14 ENCOUNTER — Encounter: Payer: Self-pay | Admitting: Nurse Practitioner

## 2022-05-14 ENCOUNTER — Non-Acute Institutional Stay: Payer: Medicare Other | Admitting: Nurse Practitioner

## 2022-05-14 DIAGNOSIS — R5381 Other malaise: Secondary | ICD-10-CM

## 2022-05-14 DIAGNOSIS — Z515 Encounter for palliative care: Secondary | ICD-10-CM

## 2022-05-14 DIAGNOSIS — F0390 Unspecified dementia without behavioral disturbance: Secondary | ICD-10-CM

## 2022-05-14 NOTE — Progress Notes (Signed)
[facility] Consult Note Telephone: [phone]  Fax: [phone]    Date of encounter: 05/14/22 8:38 PM PATIENT NAME: Faith Vasquez [place] [address]   [phone] (home)  DOB: 03-04-1923 MRN: [id number] PRIMARY CARE PROVIDER:    [facility]  RESPONSIBLE PARTY:    Contact Information     Name Relation Home Work Mobile   [place] Son   [phone]   Misha, [place] Relative   [phone]   [facility] Daughter   [phone]     I met face to face with patient in facility. Palliative Care was asked to follow this patient by consultation request of  Gladstone Lighter, MD to address advance care planning and complex medical decision making. This is the initial visit.                            ASSESSMENT AND PLAN / RECOMMENDATIONS:  Symptom Management/Plan: 1. Advance Care Planning;  DNR 2. Goals of Care: Goals include to maximize quality of life and symptom management. Our advance care planning conversation included a discussion about:    The value and importance of advance care planning  Exploration of personal, cultural or spiritual beliefs that might influence medical decisions  Exploration of goals of care in the event of a sudden injury or illness  Identification and preparation of a healthcare agent  Review and updating or creation of an advance directive document.   3. Debility secondary to dementia, progressive; continue to monitor, supportive measures. Weights; appetite; cognitive and functional changes, fall precautions   4. Palliative care encounter; Palliative care encounter; Palliative medicine team will continue to support patient, patient's family, and medical team. Visit consisted of counseling and education dealing with the complex and emotionally intense issues of symptom management and palliative care in the setting of serious and potentially life-threatening illness    Follow up Palliative Care Visit: Palliative care will continue to follow for complex medical decision making, advance care planning, and clarification of goals. Return 4 to 8 weeks or prn.   I spent 31 minutes providing this consultation. More than 50% of the time in this consultation was spent in counseling and care coordination. PPS: 50% Chief Complaint: Follow up palliative consult for complex medical decision making, address goals, manage ongoing symptoms   HISTORY OF PRESENT ILLNESS:  Faith Vasquez is a 88 y.o. year old female  with multiple medical problems including Vit d def, Alzheimer's,  hld, htn, ckd, tiaS/P stroke, gerd, diverticulosis, C difficile, history of UTI, left hip fracture S/P surgical repair, diabetes, B12 deficiency, left rotator cuff tendonitis.color cystectomy, hysterectomy, partial colectomy, appendectomy. Hospitalized 03/16/2022 to 03/19/2022 with severe sepsis, healthcare associated pneumonia with urinary tract infection, choking episodes and altered mental status with frequent falling. Blood cultures revealing E coli antibiotics switch to rocephin and flagyl. Pulmonology consulted with concern for a soft tissue mass behind trachea noted on CT head slash neck, ENT consult which appears to be in the glottis slash epiglottis area. [facility] showed no abnormalities mass appears to be below larynx recommended barium swallow and bronchoscopy her family wishes to pursue. Family did not want to be aggressive due to advanced age and dementia and wish for palliative care consult. Barium swallow showed severe esophageal dysmotilityWith small sliding type hiatal hernia and no esophageal mass or structured identified. Family wish no further invasive measures with repeated blood cultures negative. She was discharged back  to [facility] memory care unit where she currently resides. Just a DNR. I visited and observed [name] in her Locked Memory Care unit at  [facility]. She was sitting in the dining area just finished breakfast. Ms. Fielding requires assistance with adl's including bathing, dressing, ambulation with a walker. Ms. Arment is able to feed herself with fair appetite per staff. Ms. Spagna did make eye contact, answered questions, put her head down on the dining table and went to sleep. Ms Lamison was cooperative. No meaningful discussion with cognitive impairment. Reviewed medications, Medical goals and poc. Support provided. No new changes to pc today  History obtained from review of EMR, discussion with primary team, and interview with family, facility staff/caregiver and/or Ms. Wares.  I reviewed available labs, medications, imaging, studies and related documents from the EMR.  Records reviewed and summarized above.    ROS 10 point system reviewed all negative except HPI   Physical Exam: Constitutional: NAD General: frail appearing, elderly female EYES: lids intact ENMT: oral mucous membranes moist MSK: ambulatory with walker Skin: warm and dry Neuro:  no generalized weakness,  no cognitive impairment Psych: non-anxious affect, Alert, confused Thank you for the opportunity to participate in the care of [name]. Please call our office at [phone] if we can be of additional assistance.   Hesper Venturella Ihor Gully, NP

## 2022-06-16 ENCOUNTER — Non-Acute Institutional Stay: Payer: Medicare Other | Admitting: Nurse Practitioner

## 2022-06-16 DIAGNOSIS — R5381 Other malaise: Secondary | ICD-10-CM

## 2022-06-16 DIAGNOSIS — F0390 Unspecified dementia without behavioral disturbance: Secondary | ICD-10-CM

## 2022-06-16 DIAGNOSIS — Z515 Encounter for palliative care: Secondary | ICD-10-CM

## 2022-06-16 NOTE — Progress Notes (Signed)
[facility] Consult Note Telephone: [phone]  Fax: [phone]    Date of encounter: 06/16/22 4:59 PM PATIENT NAME: Faith Vasquez [place] [address]   [phone] (home)  DOB: [birth year] MRN: [id number] PRIMARY CARE PROVIDER:    [facility] Locked [facility] Care Unit [facility] RESPONSIBLE PARTY:    Contact Information     Name Relation Home Work Mobile   [place] Son   [phone]   Gregg, [place] Relative   [phone]   [facility] Daughter   [phone]     I met face to face with patient in facility. Palliative Care was asked to follow this patient by consultation request of  Gladstone Lighter, MD to address advance care planning and complex medical decision making. This is the initial visit.                            ASSESSMENT AND PLAN / RECOMMENDATIONS:  Symptom Management/Plan: 1. Advance Care Planning;  DNR 2. Goals of Care: Goals include to maximize quality of life and symptom management. Our advance care planning conversation included a discussion about:    The value and importance of advance care planning  Exploration of personal, cultural or spiritual beliefs that might influence medical decisions  Exploration of goals of care in the event of a sudden injury or illness  Identification and preparation of a healthcare agent  Review and updating or creation of an advance directive document.   3. Debility secondary to dementia, progressive; continue to monitor, supportive measures. Weights; appetite; cognitive and functional changes, fall precautions   4. Palliative care encounter; Palliative care encounter; Palliative medicine team will continue to support patient, patient's family, and medical team. Visit consisted of counseling and education dealing with the complex and emotionally intense issues of symptom management and palliative care in the setting of serious and potentially  life-threatening illness   Follow up Palliative Care Visit: Palliative care will continue to follow for complex medical decision making, advance care planning, and clarification of goals. Return 4 to 8 weeks or prn.   I spent 31 minutes providing this consultation. More than 50% of the time in this consultation was spent in counseling and care coordination. PPS: 50% Chief Complaint: Follow up palliative consult for complex medical decision making, address goals, manage ongoing symptoms   HISTORY OF PRESENT ILLNESS:  Faith Vasquez is a 89 y.o. year old female  with multiple medical problems including Vit d def, Alzheimer's,  hld, htn, ckd, tiaS/P stroke, gerd, diverticulosis, C difficile, history of UTI, left hip fracture S/P surgical repair, diabetes, B12 deficiency, left rotator cuff tendonitis.color cystectomy, hysterectomy, partial colectomy, appendectomy. Purpose of today PC f/u visit further discussion monitor trends of appetite, weights, monitor for functional, cognitive decline with chronic disease progression, assess any active symptoms, supportive role. At present Ms Kozma is sitting in the chair at the dining room table eating lunch. Ms Lenahan made brief eye contact, continued to feed herself, did answer 2 ros questions then became irritable. Support provided, No meaningful discussion with cognitive impairment, irritability.  Medications, poc, medical goals reviewed.  PC f/u visit further discussion monitor trends of appetite, weights, monitor for functional, cognitive decline with chronic disease progression, assess any active symptoms, supportive role. Updated staff, attempted to contact [facility].   \No meaningful discussion with cognitive impairment. Reviewed medications, Medical goals and poc. Support provided. No new changes to pc today   History  obtained from review of EMR, discussion with primary team, and interview with family, facility staff/caregiver and/or Ms. Humphreys.  I  reviewed available labs, medications, imaging, studies and related documents from the EMR.  Records reviewed and summarized above.    Physical Exam: Constitutional: NAD General: frail appearing, elderly female EYES: lids intact ENMT: oral mucous membranes moist MSK: ambulatory with walker Skin: warm and dry Neuro:  no generalized weakness,  no cognitive impairment Psych: non-anxious affect, Alert, confused Thank you for the opportunity to participate in the care of Ms. Mault. Please call our office at [phone] if we can be of additional assistance.   Anola Mcgough Ihor Gully, NP

## 2022-08-06 ENCOUNTER — Other Ambulatory Visit: Payer: Self-pay

## 2022-08-06 ENCOUNTER — Emergency Department
Admission: EM | Admit: 2022-08-06 | Discharge: 2022-08-06 | Disposition: A | Discharge status: Home / Self Care | Payer: Medicare Other | Attending: Emergency Medicine | Admitting: Emergency Medicine

## 2022-08-06 ENCOUNTER — Encounter: Payer: Self-pay | Admitting: Intensive Care

## 2022-08-06 DIAGNOSIS — L03113 Cellulitis of right upper limb: Secondary | ICD-10-CM

## 2022-08-06 DIAGNOSIS — F0390 Unspecified dementia without behavioral disturbance: Secondary | ICD-10-CM | POA: Diagnosis not present

## 2022-08-06 DIAGNOSIS — N1831 Chronic kidney disease, stage 3a: Secondary | ICD-10-CM | POA: Insufficient documentation

## 2022-08-06 DIAGNOSIS — I129 Hypertensive chronic kidney disease with stage 1 through stage 4 chronic kidney disease, or unspecified chronic kidney disease: Secondary | ICD-10-CM | POA: Insufficient documentation

## 2022-08-06 DIAGNOSIS — R2231 Localized swelling, mass and lump, right upper limb: Secondary | ICD-10-CM | POA: Diagnosis present

## 2022-08-06 DIAGNOSIS — Z23 Encounter for immunization: Secondary | ICD-10-CM | POA: Diagnosis not present

## 2022-08-06 HISTORY — DX: Essential (primary) hypertension: I10

## 2022-08-06 MED ORDER — TETANUS-DIPHTH-ACELL PERTUSSIS 5-2.5-18.5 LF-MCG/0.5 IM SUSY
0.5000 mL | PREFILLED_SYRINGE | Freq: Once | INTRAMUSCULAR | Status: AC
  Administered 2022-08-06: 0.5 mL via INTRAMUSCULAR
  Filled 2022-08-06: qty 0.5

## 2022-08-06 MED ORDER — CEPHALEXIN 500 MG PO CAPS: 500.0000 mg | ORAL_CAPSULE | Freq: Four times a day (QID) | ORAL | 0 refills | Status: AC

## 2022-08-06 MED ORDER — MUPIROCIN 2 % EX OINT: 1.0000 | TOPICAL_OINTMENT | Freq: Two times a day (BID) | CUTANEOUS | 0 refills | Status: AC
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
## 2022-08-06 NOTE — Discharge Instructions (Addendum)
Take the antibiotics as prescribed.  Keep your hands clean with soap and water. Please return for any new, worsening, or change in symptoms or other concerns, or if the redness spreads further. It was a pleasure caring for you today. Enjoy your manicure!

## 2022-08-14 ENCOUNTER — Non-Acute Institutional Stay: Payer: Medicare Other | Admitting: Nurse Practitioner

## 2022-08-14 DIAGNOSIS — F0390 Unspecified dementia without behavioral disturbance: Secondary | ICD-10-CM

## 2022-08-14 DIAGNOSIS — R5381 Other malaise: Secondary | ICD-10-CM

## 2022-08-14 DIAGNOSIS — Z515 Encounter for palliative care: Secondary | ICD-10-CM

## 2022-08-14 NOTE — Progress Notes (Addendum)
Designer, jewellery Palliative Care Consult Note Telephone: [phone]  Fax: [phone]    Date of encounter: 08/14/22 4:33 PM PATIENT NAME: Faith Vasquez [place] [address]   [phone] (home)  DOB: 1923-04-14 MRN: [id number] PRIMARY CARE PROVIDER:    Irene Limbo NP [facility] Locked Memory Care Unit RESPONSIBLE PARTY:    Contact Information     Name Relation Home Work Mobile   [place] Son   [phone]   [place], [place] Relative   [phone]   [facility] Daughter   [phone]     I met face to face with patient in facility. Palliative Care was asked to follow this patient by consultation request of  Gladstone Lighter, MD to address advance care planning and complex medical decision making. This is the initial visit.                            ASSESSMENT AND PLAN / RECOMMENDATIONS:  Symptom Management/Plan: 1. Advance Care Planning;  DNR 2. Debility secondary to dementia, progressive; continue to monitor, supportive measures. Continue weights; appetite; cognitive and functional changes, fall precautions 08/08/2022 weight 152 lbs  3. Palliative care encounter; Palliative care encounter; Palliative medicine team will continue to support patient, patient's family, and medical team. Visit consisted of counseling and education dealing with the complex and emotionally intense issues of symptom management and palliative care in the setting of serious and potentially life-threatening illness   Follow up Palliative Care Visit: Palliative care will continue to follow for complex medical decision making, advance care planning, and clarification of goals. Return 4 to 8 weeks or prn.   I spent 42  minutes providing this consultation. More than 50% of the time in this consultation was spent in counseling and care coordination. PPS: 50% Chief Complaint: Follow up palliative consult for complex medical decision making,  address goals, manage ongoing symptoms   HISTORY OF PRESENT ILLNESS:  Coby Bartoli is a 89 y.o. year old female  with multiple medical problems including Vit d def, Alzheimer's,  hld, htn, ckd, tiaS/P stroke, gerd, diverticulosis, C difficile, history of UTI, left hip fracture S/P surgical repair, diabetes, B12 deficiency, left rotator cuff tendonitis.color cystectomy, hysterectomy, partial colectomy, appendectomy. Purpose of today PC f/u visit further discussion monitor trends of appetite, weights, monitor for functional, cognitive decline with chronic disease progression, assess any active symptoms, supportive role. At present Ms Suh is ambulating with her walker from the sunroom to the dining room table, endorses she is doing good, smiling. No recent falls, hospitalizations, infections per staff. She did sustain a skin tear to right hand which is currently dressed, no drainage noted. Ms Shirar was smiling, engaging, cooperative today though no meaningful discussion. Ms Townley with discussion with staff appears stable at present time. Will contact son, Louie Casa for update on pc visit, medication, poc, goc reviewed. PC f/u visit further discussion monitor trends of appetite, weights, monitor for functional, cognitive decline with chronic disease progression, assess any active symptoms, supportive role. Updated staff, Support provided. No new changes to pc today   History obtained from review of EMR, discussion with primary team, and interview with family, facility staff/caregiver and/or Ms. Rzasa.  I reviewed available labs, medications, imaging, studies and related documents from the EMR.  Records reviewed and summarized above.    Physical Exam: General: frail appearing, elderly female, smiling, engaging ENMT: oral mucous membranes moist MSK: ambulatory with walker Skin: warm and dry;  dressing to right hand CDI Psych: non-anxious affect, Alert, confused Thank you for the opportunity to  participate in the care of Ms. Lubeck. Please call our office at [phone] if we can be of additional assistance.   Alease Fait Ihor Gully, NP

## 2022-09-24 ENCOUNTER — Non-Acute Institutional Stay: Payer: Medicare Other | Admitting: Nurse Practitioner

## 2022-09-24 DIAGNOSIS — F0390 Unspecified dementia without behavioral disturbance: Secondary | ICD-10-CM

## 2022-09-24 DIAGNOSIS — R634 Abnormal weight loss: Secondary | ICD-10-CM

## 2022-09-24 DIAGNOSIS — R630 Anorexia: Secondary | ICD-10-CM

## 2022-09-24 DIAGNOSIS — Z515 Encounter for palliative care: Secondary | ICD-10-CM

## 2022-09-24 NOTE — Progress Notes (Addendum)
Therapist, nutritional Palliative Care Consult Note Telephone: [phone]  Fax: [phone]    Date of encounter: 09/24/22 3:25 PM PATIENT NAME: Faith Vasquez [address] [place] [address]   [phone] (home)  DOB: Feb 04, 1923 MRN: [id number] PRIMARY CARE PROVIDER:    Gwenevere Ghazi NP/[facility] Locked [facility] Care Unit  RESPONSIBLE PARTY:    Contact Information     Name Relation Home Work Mobile   [place] Son   [phone]   Sameen, Leas Relative   [phone]   [facility] Daughter   [phone]     I  met face to face with patient in facility. Palliative Care was asked to follow this patient by consultation request of  Enid Baas, MD to address advance care planning and complex medical decision making. This is the initial visit.                            ASSESSMENT AND PLAN / RECOMMENDATIONS:  Symptom Management/Plan: 1. Advance Care Planning;  DNR 2. Anorexia with weight loss secondary to dementia, progressive; continue to monitor, supportive measures. Continue weights; appetite; cognitive and functional changes, fall precautions 08/08/2022 weight 152 lbs 09/08/2022 weight 145 lbs 7 lbs loss/4 weeks; 4.61% 3. Palliative care encounter; Palliative care encounter; Palliative medicine team will continue to support patient, patient's family, and medical team. Visit consisted of counseling and education dealing with the complex and emotionally intense issues of symptom management and palliative care in the setting of serious and potentially life-threatening illness   Follow up Palliative Care Visit: Palliative care will continue to follow for complex medical decision making, advance care planning, and clarification of goals. Return 2 to 8 weeks or prn.   I spent 45 minutes providing this consultation. More than 50% of the time in this consultation was spent in counseling and care coordination. PPS: 50% Chief Complaint:  Follow up palliative consult for complex medical decision making, address goals, manage ongoing symptoms   HISTORY OF PRESENT ILLNESS:  Javier Mamone is a 89 y.o. year old female  with multiple medical problems including Vit d def, Alzheimer's,  hld, htn, ckd, tiaS/P stroke, gerd, diverticulosis, C difficile, history of UTI, left hip fracture S/P surgical repair, diabetes, B12 deficiency, left rotator cuff tendonitis.color cystectomy, hysterectomy, partial colectomy, appendectomy. Purpose of today PC f/u visit further discussion monitor trends of appetite, weights, monitor for functional, cognitive decline with chronic disease progression, assess any active symptoms, supportive role. At present Ms Vanzee is ambulating with her walker in her bathroom/room with daughter in law present. We talked about pc visit, ros, functional and cognitive abilities, behaviors. We talked about daily routine, appetite. Reviewed weights. Medications, goc, poc reviewed. Will continue to monitor and follow. Currently Ms Mcveigh was going to lunch out of the facility with son/daughter in law. Contact information given. Ms Don continues to be ambulatory with walker. No recent falls, wounds, hospitalizations, infections. Support provided. PC f/u visit further discussion monitor trends of appetite, weights, monitor for functional, cognitive decline with chronic disease progression, assess any active symptoms, supportive role. Updated staff, Support provided. No new changes to pc today   History obtained from review of EMR, discussion with primary team, and interview with family, facility staff/caregiver and/or Ms. Standen.  I reviewed available labs, medications, imaging, studies and related documents from the EMR.  Records reviewed and summarized above.    Physical Exam: General: frail appearing, elderly female, irriatable ENMT: oral mucous membranes  moist Heart: RRR Pulmonary: Breath sounds clear MSK: ambulatory  with walker Skin: warm and dry;  Psych: non-anxious affect, Alert, confused Thank you for the opportunity to participate in the care of Ms. Duch. Please call our office at [phone] if we can be of additional assistance.   Joelle Flessner [place], NP

## 2022-12-23 ENCOUNTER — Other Ambulatory Visit: Payer: Self-pay

## 2022-12-23 ENCOUNTER — Encounter: Payer: Self-pay | Admitting: Emergency Medicine

## 2022-12-23 ENCOUNTER — Emergency Department: Payer: Medicare Other

## 2022-12-23 ENCOUNTER — Emergency Department
Admission: EM | Admit: 2022-12-23 | Discharge: 2022-12-23 | Disposition: A | Discharge status: Skilled Nursing Facility | Payer: Medicare Other | Attending: Emergency Medicine | Admitting: Emergency Medicine

## 2022-12-23 DIAGNOSIS — N3000 Acute cystitis without hematuria: Secondary | ICD-10-CM | POA: Diagnosis not present

## 2022-12-23 DIAGNOSIS — I10 Essential (primary) hypertension: Secondary | ICD-10-CM | POA: Insufficient documentation

## 2022-12-23 DIAGNOSIS — R1030 Lower abdominal pain, unspecified: Secondary | ICD-10-CM | POA: Diagnosis present

## 2022-12-23 LAB — COMPREHENSIVE METABOLIC PANEL
ALT: 19 U/L (ref 0–44)
AST: 27 U/L (ref 15–41)
Albumin: 3.9 g/dL (ref 3.5–5.0)
Alkaline Phosphatase: 67 U/L (ref 38–126)
Anion gap: 8 (ref 5–15)
BUN: 29 mg/dL — ABNORMAL HIGH (ref 8–23)
CO2: 22 mmol/L (ref 22–32)
Calcium: 9.8 mg/dL (ref 8.9–10.3)
Chloride: 107 mmol/L (ref 98–111)
Creatinine, Ser: 1.54 mg/dL — ABNORMAL HIGH (ref 0.44–1.00)
GFR, Estimated: 31 mL/min — ABNORMAL LOW (ref >60)
Glucose, Bld: 128 mg/dL — ABNORMAL HIGH (ref 70–99)
Potassium: 3.9 mmol/L (ref 3.5–5.1)
Sodium: 137 mmol/L (ref 135–145)
Total Bilirubin: 0.5 mg/dL (ref 0.3–1.2)
Total Protein: 6.6 g/dL (ref 6.5–8.1)

## 2022-12-23 LAB — URINALYSIS, ROUTINE W REFLEX MICROSCOPIC
Bilirubin Urine: NEGATIVE
Glucose, UA: NEGATIVE mg/dL
Hgb urine dipstick: NEGATIVE
Ketones, ur: NEGATIVE mg/dL
Nitrite: POSITIVE — AB
Protein, ur: NEGATIVE mg/dL
Specific Gravity, Urine: 1.009 (ref 1.005–1.030)
pH: 7 (ref 5.0–8.0)

## 2022-12-23 LAB — CBC
HCT: 45.4 % (ref 36.0–46.0)
Hemoglobin: 14.9 g/dL (ref 12.0–15.0)
MCH: 28.3 pg (ref 26.0–34.0)
MCHC: 32.8 g/dL (ref 30.0–36.0)
MCV: 86.3 fL (ref 80.0–100.0)
Platelets: 193 10*3/uL (ref 150–400)
RBC: 5.26 MIL/uL — ABNORMAL HIGH (ref 3.87–5.11)
RDW: 14.4 % (ref 11.5–15.5)
WBC: 6.4 10*3/uL (ref 4.0–10.5)
nRBC: 0 % (ref 0.0–0.2)

## 2022-12-23 LAB — TROPONIN I (HIGH SENSITIVITY)
Troponin I (High Sensitivity): 21 ng/L — ABNORMAL HIGH (ref <18)
Troponin I (High Sensitivity): 20 ng/L — ABNORMAL HIGH (ref <18)

## 2022-12-23 LAB — LIPASE, BLOOD: Lipase: 43 U/L (ref 11–51)

## 2022-12-23 MED ORDER — SODIUM CHLORIDE 0.9 % IV BOLUS
500.0000 mL | Freq: Once | INTRAVENOUS | Status: AC
  Administered 2022-12-23: 500 mL via INTRAVENOUS

## 2022-12-23 MED ORDER — SODIUM CHLORIDE 0.9 % IV SOLN
1.0000 g | Freq: Once | INTRAVENOUS | Status: AC
  Administered 2022-12-23: 1 g via INTRAVENOUS
  Filled 2022-12-23: qty 10

## 2022-12-23 MED ORDER — CEPHALEXIN 500 MG PO CAPS: 500.0000 mg | ORAL_CAPSULE | Freq: Four times a day (QID) | ORAL | 0 refills | Status: AC

## 2022-12-23 MED ORDER — SODIUM CHLORIDE 0.9 % IV BOLUS: 500.0000 mL | Freq: Once | INTRAVENOUS | Status: DC

## 2022-12-23 NOTE — ED Triage Notes (Signed)
Pt arrives via EMS from [facility] with abd pain for 3 days and HTN. Denies n/v or changes in appetite. Pt denies abd pain at this time.

## 2022-12-23 NOTE — Discharge Instructions (Addendum)
Take Keflex four times daily for the next seven days.

## 2022-12-23 NOTE — ED Provider Notes (Signed)
Shared visit   Presented to the emergency department with abdominal pain.  Also had worsening elevated hypertension.  Denies any chest pain or shortness of breath.  Urine concerning for urinary tract infection.  Urine culture was obtained.  CT scan abdomen and pelvis reviewed with no signs of intra-abdominal pathology to explain the patient's abdominal pain.  No signs of acute diverticulitis.  Plan for antibiotics.  Initial troponin mildly elevated, serial troponin currently in process.  If reassuring plan to discharge home with close follow-up with primary care provider and return precautions.   Corena Herter, MD 12/23/22 [phone]

## 2022-12-23 NOTE — ED Notes (Signed)
See triage notes. Patient was brought in by EMS due to abdominal pains and HTN x three days.

## 2022-12-23 NOTE — ED Provider Notes (Signed)
[facility] Provider Note  Patient Contact: 6:32 PM (approximate)   History   Abdominal Pain   HPI  Faith Vasquez is a 89 y.o. female with a history of UTIs, and prior appendectomy, presents to the emergency department with intermittent complaints of lower abdominal pain for the past 3 to 4 days.  Patient has had no reports of dysuria, hematuria or increased urinary frequency.  She has had no low back pain.  No vomiting, diarrhea or fever.  Patient is accompanied by her oldest son who states that patient has not started on any new medications and has not had any falls or traumas to their knowledge.  Patient's caretaker reports that patient has been calling crying about abdominal pain for the past 3 days.  No reports of chest pain, chest tightness or shortness of breath.      Physical Exam   Triage Vital Signs: ED Triage Vitals  Encounter Vitals Group     BP 12/23/22 1719 (!) 186/97     Systolic BP Percentile --      Diastolic BP Percentile --      Pulse Rate 12/23/22 1719 91     Resp 12/23/22 1719 18     Temp 12/23/22 1719 98.3 F (36.8 C)     Temp Source 12/23/22 1719 Oral     SpO2 12/23/22 1717 94 %     Weight 12/23/22 1720 147 lb 11.3 oz (67 kg)     Height 12/23/22 1720 5\' 6"  (1.676 m)     Head Circumference --      Peak Flow --      Pain Score 12/23/22 1719 0     Pain Loc --      Pain Education --      Exclude from Growth Chart --     Most recent vital signs: Vitals:   12/23/22 1717 12/23/22 1719  BP:  (!) 186/97  Pulse:  91  Resp:  18  Temp:  98.3 F (36.8 C)  SpO2: 94% 95%     General: Alert and in no acute distress. Eyes:  PERRL. EOMI. Head: No acute traumatic findings ENT:      Nose: No congestion/rhinnorhea.      Mouth/Throat: Mucous membranes are moist. Neck: No stridor. No cervical spine tenderness to palpation. Cardiovascular:  Good peripheral perfusion Respiratory: Normal respiratory effort without tachypnea or  retractions. Lungs CTAB. Good air entry to the bases with no decreased or absent breath sounds. Gastrointestinal: Bowel sounds 4 quadrants. Soft and nontender to palpation. No guarding or rigidity. No palpable masses. No distention. No CVA tenderness. Musculoskeletal: Full range of motion to all extremities.  Neurologic:  No gross focal neurologic deficits are appreciated.  Skin:   No rash noted Other:   ED Results / Procedures / Treatments   Labs (all labs ordered are listed, but only abnormal results are displayed) Labs Reviewed  COMPREHENSIVE METABOLIC PANEL - Abnormal; Notable for the following components:      Result Value   Glucose, Bld 128 (*)    BUN 29 (*)    Creatinine, Ser 1.54 (*)    GFR, Estimated 31 (*)    All other components within normal limits  CBC - Abnormal; Notable for the following components:   RBC 5.26 (*)    All other components within normal limits  URINALYSIS, ROUTINE W REFLEX MICROSCOPIC - Abnormal; Notable for the following components:   Color, Urine YELLOW (*)    APPearance CLEAR (*)  Nitrite POSITIVE (*)    Leukocytes,Ua TRACE (*)    Bacteria, UA RARE (*)    All other components within normal limits  TROPONIN I (HIGH SENSITIVITY) - Abnormal; Notable for the following components:   Troponin I (High Sensitivity) 21 (*)    All other components within normal limits  TROPONIN I (HIGH SENSITIVITY) - Abnormal; Notable for the following components:   Troponin I (High Sensitivity) 20 (*)    All other components within normal limits  URINE CULTURE  LIPASE, BLOOD      RADIOLOGY  I personally viewed and evaluated these images as part of my medical decision making, as well as reviewing the written report by the radiologist.  ED Provider Interpretation: CT abdomen pelvis unremarkable.   PROCEDURES:  Critical Care performed: No  Procedures   MEDICATIONS ORDERED IN ED: Medications  cefTRIAXone (ROCEPHIN) 1 g in sodium chloride 0.9 % 100 mL  IVPB (1 g Intravenous New Bag/Given 12/23/22 2002)  sodium chloride 0.9 % bolus 500 mL (0 mLs Intravenous Stopped 12/23/22 2002)     IMPRESSION / MDM / ASSESSMENT AND PLAN / ED COURSE  I reviewed the triage vital signs and the nursing notes.                              Assessment and plan Abdominal pain 89 year old female presents to the emergency department with lower abdominal pain for the past 3 to 4 days.  Patient was hypertensive at triage but vital signs were otherwise reassuring.  On exam, patient is alert and nontoxic-appearing.  Her history is facilitated by caretakers in the room but patient overall seems oriented.  CMP notable for slightly increased creatinine compared to her baseline.  White blood cell count within range on CBC.  Urinalysis concerning for UTI.  Culture in process.  Will obtain CT abdomen pelvis and will reassess.  CT abdomen pelvis unremarkable.  Patient was given 500 cc of normal saline and IV Rocephin.  She was discharged with Keflex to be taken 4 times daily for the next 7 days.      FINAL CLINICAL IMPRESSION(S) / ED DIAGNOSES   Final diagnoses:  Acute cystitis without hematuria     Rx / DC Orders   ED Discharge Orders          Ordered    cephALEXin (KEFLEX) 500 MG capsule  4 times daily        12/23/22 2018             Note:  This document was prepared using Dragon voice recognition software and may include unintentional dictation errors.   Pia Mau [place], PA-C 12/23/22 2022    Corena Herter, MD 12/23/22 2031

## 2022-12-27 ENCOUNTER — Telehealth: Payer: Self-pay

## 2022-12-27 NOTE — Telephone Encounter (Signed)
Patient discharged 7/30 on Keflex. Culture growing ESBL E.coli. Recommended fosfomycin 3 grams PO every 48 hours for 3 doses. Spoke with patient's daughter in law regarding culture results and need to change antibiotic patient was discharged on. Family agreeable to this change. Prescription called in to [facility] on [address] in [place].

## 2024-06-30 ENCOUNTER — Emergency Department

## 2024-06-30 ENCOUNTER — Emergency Department
Admission: EM | Admit: 2024-06-30 | Discharge: 2024-06-30 | Disposition: A | Discharge status: Home / Self Care | Attending: Emergency Medicine | Admitting: Emergency Medicine

## 2024-06-30 DIAGNOSIS — R079 Chest pain, unspecified: Secondary | ICD-10-CM | POA: Insufficient documentation

## 2024-06-30 DIAGNOSIS — I10 Essential (primary) hypertension: Secondary | ICD-10-CM | POA: Insufficient documentation

## 2024-06-30 DIAGNOSIS — F0390 Unspecified dementia without behavioral disturbance: Secondary | ICD-10-CM | POA: Insufficient documentation

## 2024-06-30 LAB — CBC
HCT: 41.9 % (ref 36.0–46.0)
Hemoglobin: 13.7 g/dL (ref 12.0–15.0)
MCH: 28.8 pg (ref 26.0–34.0)
MCHC: 32.7 g/dL (ref 30.0–36.0)
MCV: 88 fL (ref 80.0–100.0)
Platelets: 152 10*3/uL (ref 150–400)
RBC: 4.76 MIL/uL (ref 3.87–5.11)
RDW: 14.1 % (ref 11.5–15.5)
WBC: 6.3 10*3/uL (ref 4.0–10.5)
nRBC: 0 % (ref 0.0–0.2)

## 2024-06-30 LAB — COMPREHENSIVE METABOLIC PANEL WITH GFR
ALT: 14 U/L (ref 0–44)
AST: 27 U/L (ref 15–41)
Albumin: 3.9 g/dL (ref 3.5–5.0)
Alkaline Phosphatase: 106 U/L (ref 38–126)
Anion gap: 11 (ref 5–15)
BUN: 27 mg/dL — ABNORMAL HIGH (ref 8–23)
CO2: 22 mmol/L (ref 22–32)
Calcium: 9.7 mg/dL (ref 8.9–10.3)
Chloride: 107 mmol/L (ref 98–111)
Creatinine, Ser: 0.99 mg/dL (ref 0.44–1.00)
GFR, Estimated: 51 mL/min — ABNORMAL LOW
Glucose, Bld: 191 mg/dL — ABNORMAL HIGH (ref 70–99)
Potassium: 3.7 mmol/L (ref 3.5–5.1)
Sodium: 140 mmol/L (ref 135–145)
Total Bilirubin: 0.4 mg/dL (ref 0.0–1.2)
Total Protein: 6.3 g/dL — ABNORMAL LOW (ref 6.5–8.1)

## 2024-06-30 NOTE — ED Notes (Addendum)
 Pt repositioned in bed by this RN and Brittany, NT

## 2024-06-30 NOTE — Discharge Instructions (Signed)
 Your workup in the emergency department has shown normal results.  We could not perform cardiac lab work in the emergency department today.  If the patient develops any further chest pain please return to the emergency department for further workup and evaluation.

## 2024-06-30 NOTE — ED Triage Notes (Signed)
 Pt to ED Via [facility] from [facility] c/o CP and weakness. 324mg  aspirin  given by EMS. Hx of dementia axo2. Pt denies pain at this time.   81 HR 99 O2 RA 153/86

## 2024-06-30 NOTE — ED Notes (Signed)
 Paduchowski, Md at bedside. Md notified family that lab was unable to complete troponin testing due to equipment malfunction. Family requesting to leave at this time. Legal guardian, Faith Vasquez at bedside. Randy called [employer] to update staff. No questions for RN at this time. Pt  and family verbalize understanding of discharge instructions. Opportunity for questioning and answers were provided. Pt discharged from ED with son.

## 2024-06-30 NOTE — ED Provider Notes (Signed)
 "  [facility] Provider Note    Event Date/Time   First MD Initiated Contact with Patient 06/30/24 1914     (approximate)  History   Chief Complaint: Chest Pain  HPI  Faith Vasquez is a 89 y.o. female with a past medical history of hypertension, dementia, presents to the emergency department from her memory care facility for chest pain.  According to report patient was complaining of some chest pain at her memory care facility.  Son is here with the patient.  Does not believe the patient is ever had any cardiac issues.  Here the patient is awake alert she initially did not recall why she is here although when I reminded her that she may have been having some chest pain earlier she states she did but no longer has any pain.  Patient denies any pain or any complaints currently.  Vital signs reassuring.  Patient calm cooperative and pleasant.  Son is here with the patient  Physical Exam   Triage Vital Signs: ED Triage Vitals  Encounter Vitals Group     BP 06/30/24 1919 (!) 144/77     Girls Systolic BP Percentile --      Girls Diastolic BP Percentile --      Boys Systolic BP Percentile --      Boys Diastolic BP Percentile --      Pulse Rate 06/30/24 1919 81     Resp 06/30/24 1919 18     Temp 06/30/24 1928 98 F (36.7 C)     Temp Source 06/30/24 1928 Axillary     SpO2 06/30/24 1919 94 %     Weight 06/30/24 1918 160 lb 15 oz (73 kg)     Height 06/30/24 1918 5' 6 (1.676 m)     Head Circumference --      Peak Flow --      Pain Score 06/30/24 1918 0     Pain Loc --      Pain Education --      Exclude from Growth Chart --     Most recent vital signs: Vitals:   06/30/24 1919 06/30/24 1928  BP: (!) 144/77   Pulse: 81   Resp: 18   Temp:  98 F (36.7 C)  SpO2: 94%     General: Awake, no distress.  CV:  Good peripheral perfusion.  Regular rate and rhythm  Resp:  Normal effort.  Equal breath sounds bilaterally.  Abd:  No distention.    ED Results /  Procedures / Treatments   EKG  EKG viewed and interpreted by myself shows what appears to be a sinus rhythm with PR prolongation.  Widened QRS, left axis deviation, no concerning ST changes.  RADIOLOGY  I reviewed interpret the chest x-ray images.  No consolidation on my evaluation. Radiology is read the x-ray is negative   MEDICATIONS ORDERED IN ED: Medications - No data to display   IMPRESSION / MDM / ASSESSMENT AND PLAN / ED COURSE  I reviewed the triage vital signs and the nursing notes.  Patient's presentation is most consistent with acute presentation with potential threat to life or bodily function.  Patient presents emergency department for chest pain from her memory care facility.  Patient has a history of dementia.  Patient denies any symptoms currently however given the complaint of chest pain earlier we will check labs including a CBC chemistry troponin likely x 2, chest x-ray and EKG.  Will continue to closely monitor.  Son is  here with the patient.  Patient is calm cooperative and pleasant.  Patient's EKG shows no significant finding.  Patient's chest x-ray is negative.  Patient CBC is normal.  Unfortunately lab states the chemistry machine is down and they are not able to run troponins.  Family states that the patient is getting somewhat antsy.  Patient has been chest pain-free her entire time in the emergency department.  I discussed with the family the options of waiting for the chemistry and troponin to run versus being discharged home given her reassuring EKG chest x-ray and CBC although we could not 100% rule out a cardiac issue.  They wish to bring the patient home to her care facility.  As the patient appears well with a reassuring exam I believe this is a reasonable plan.  Provided return precautions for any chest pain.  FINAL CLINICAL IMPRESSION(S) / ED DIAGNOSES   Chest pain   Note:  This document was prepared using Dragon voice recognition software and may  include unintentional dictation errors.   Dorothyann Drivers, MD 06/30/24 2121  "

## 2024-07-01 LAB — TROPONIN T, HIGH SENSITIVITY: Troponin T High Sensitivity: 18 ng/L (ref 0–19)
